# Patient Record
Sex: Male | Born: 1955 | Race: White | Hispanic: No | Marital: Married | Smoking: Former smoker
Health system: Southern US, Community
[De-identification: ages and names within clinical notes are randomized; demographics above are authoritative.]

## PROBLEM LIST (undated history)

## (undated) DIAGNOSIS — E291 Testicular hypofunction: Secondary | ICD-10-CM

## (undated) DIAGNOSIS — L409 Psoriasis, unspecified: Secondary | ICD-10-CM

## (undated) DIAGNOSIS — R0989 Other specified symptoms and signs involving the circulatory and respiratory systems: Secondary | ICD-10-CM

## (undated) DIAGNOSIS — T7840XA Allergy, unspecified, initial encounter: Secondary | ICD-10-CM

## (undated) DIAGNOSIS — K219 Gastro-esophageal reflux disease without esophagitis: Secondary | ICD-10-CM

## (undated) DIAGNOSIS — Z86010 Personal history of colonic polyps: Secondary | ICD-10-CM

## (undated) HISTORY — DX: Psoriasis, unspecified: L40.9

## (undated) HISTORY — DX: Other specified symptoms and signs involving the circulatory and respiratory systems: R09.89

## (undated) HISTORY — DX: Allergy, unspecified, initial encounter: T78.40XA

## (undated) HISTORY — PX: POLYPECTOMY: SHX149

## (undated) HISTORY — DX: Testicular hypofunction: E29.1

## (undated) HISTORY — DX: Gastro-esophageal reflux disease without esophagitis: K21.9

## (undated) HISTORY — DX: Personal history of colonic polyps: Z86.010

---
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
## 1998-03-06 ENCOUNTER — Ambulatory Visit (HOSPITAL_COMMUNITY): Admission: RE | Admit: 1998-03-06 | Discharge: 1998-03-06 | Payer: Self-pay | Admitting: Internal Medicine

## 1998-06-25 ENCOUNTER — Emergency Department (HOSPITAL_COMMUNITY): Admission: EM | Admit: 1998-06-25 | Discharge: 1998-06-25 | Payer: Self-pay | Admitting: Psychiatry

## 1999-03-05 ENCOUNTER — Ambulatory Visit (HOSPITAL_COMMUNITY): Admission: RE | Admit: 1999-03-05 | Discharge: 1999-03-05 | Payer: Self-pay | Admitting: Internal Medicine

## 1999-03-05 ENCOUNTER — Encounter: Payer: Self-pay | Admitting: Internal Medicine

## 2000-03-03 ENCOUNTER — Ambulatory Visit (HOSPITAL_COMMUNITY): Admission: RE | Admit: 2000-03-03 | Discharge: 2000-03-03 | Payer: Self-pay | Admitting: Internal Medicine

## 2000-03-03 ENCOUNTER — Encounter: Payer: Self-pay | Admitting: Internal Medicine

## 2001-03-08 ENCOUNTER — Ambulatory Visit (HOSPITAL_COMMUNITY): Admission: RE | Admit: 2001-03-08 | Discharge: 2001-03-08 | Payer: Self-pay | Admitting: Internal Medicine

## 2001-03-08 ENCOUNTER — Encounter: Payer: Self-pay | Admitting: Internal Medicine

## 2002-04-11 ENCOUNTER — Encounter: Payer: Self-pay | Admitting: Internal Medicine

## 2002-04-11 ENCOUNTER — Ambulatory Visit (HOSPITAL_COMMUNITY): Admission: RE | Admit: 2002-04-11 | Discharge: 2002-04-11 | Payer: Self-pay | Admitting: Internal Medicine

## 2003-07-01 ENCOUNTER — Encounter: Payer: Self-pay | Admitting: Internal Medicine

## 2003-07-01 ENCOUNTER — Ambulatory Visit (HOSPITAL_COMMUNITY): Admission: RE | Admit: 2003-07-01 | Discharge: 2003-07-01 | Payer: Self-pay | Admitting: Internal Medicine

## 2006-08-10 ENCOUNTER — Ambulatory Visit: Payer: Self-pay | Admitting: Internal Medicine

## 2006-08-25 ENCOUNTER — Ambulatory Visit: Payer: Self-pay | Admitting: Internal Medicine

## 2011-04-02 HISTORY — PX: HEMORRHOID BANDING: SHX5850

## 2014-01-30 ENCOUNTER — Other Ambulatory Visit: Payer: Self-pay | Admitting: Internal Medicine

## 2014-06-30 DIAGNOSIS — K219 Gastro-esophageal reflux disease without esophagitis: Secondary | ICD-10-CM | POA: Insufficient documentation

## 2014-06-30 DIAGNOSIS — E349 Endocrine disorder, unspecified: Secondary | ICD-10-CM | POA: Insufficient documentation

## 2014-06-30 DIAGNOSIS — L409 Psoriasis, unspecified: Secondary | ICD-10-CM | POA: Insufficient documentation

## 2014-06-30 DIAGNOSIS — R0989 Other specified symptoms and signs involving the circulatory and respiratory systems: Secondary | ICD-10-CM | POA: Insufficient documentation

## 2014-07-03 ENCOUNTER — Ambulatory Visit: Payer: Managed Care, Other (non HMO) | Admitting: Internal Medicine

## 2014-07-03 ENCOUNTER — Encounter: Payer: Self-pay | Admitting: Internal Medicine

## 2014-07-03 VITALS — BP 112/74 | HR 72 | Temp 98.6°F | Resp 16 | Ht 73.75 in | Wt 197.0 lb

## 2014-07-03 DIAGNOSIS — K4030 Unilateral inguinal hernia, with obstruction, without gangrene, not specified as recurrent: Secondary | ICD-10-CM

## 2014-07-03 NOTE — Patient Instructions (Signed)

## 2014-07-03 NOTE — Progress Notes (Signed)
   Subjective:    Patient ID: Christopher Lawrence, male    DOB: 13-Feb-1956, 58 y.o.   MRN: [id number]  HPI patient presents for possible Rt groin hernia. Has occas pulling sensation and pains in the Rt groin.  Medication List   EPINEPHrine 0.3 mg/0.3 mL Soaj injection  Commonly known as:  EPI-PEN  Inject 0.3 mg into the muscle once.     MULTIVITAMIN PO  Take by mouth daily.     ranitidine 300 MG tablet  Commonly known as:  ZANTAC  Take one tablet by mouth one time daily     VITAMIN D PO  Take 2,000 Units by mouth 2 (two) times daily.     Allergies  Allergen Reactions  . Wasp Venom Anaphylaxis    Wasp/Hornet/Bee  . Androgel [Testosterone] Other (See Comments)    headaches   Past Medical History  Diagnosis Date  . GERD (gastroesophageal reflux disease)   . Labile hypertension   . Other testicular hypofunction   . Psoriasis    Review of Systems Neg except as above  Objective:   Physical Exam BP 112/74  Pulse 72  Temp(Src) 98.6 F (37 C) (Temporal)  Resp 16  Ht 6' 1.75" (1.873 m)  Wt 197 lb (89.359 kg)  BMI 25.47 kg/m2  HEENT - Eac's patent. TM's Nl. EOM's full. PERRLA. NasoOroPharynx clear. Neck - supple. Nl Thyroid. Carotids 2+ & No bruits, nodes, JVD Chest - Clear equal BS w/o Rales, rhonchi, wheezes. Cor - Nl HS. RRR w/o sig MGR. PP 1(+). No edema. Abd - No palpable organomegaly, masses or tenderness. BS nl. Rt Inguinal ring is patulous with a small forced protrusion. Left side is neg. MS- FROM w/o deformities. Muscle power, tone and bulk Nl. Gait Nl. Neuro - No obvious  abnormalities. Psyche - Mental status normal & appropriate.   Assessment & Plan:   1. Rt. Inguinal hernia   - Ambulatory referral to General Surgery

## 2014-07-24 ENCOUNTER — Other Ambulatory Visit (INDEPENDENT_AMBULATORY_CARE_PROVIDER_SITE_OTHER): Payer: Self-pay | Admitting: Surgery

## 2014-07-26 DIAGNOSIS — Z131 Encounter for screening for diabetes mellitus: Secondary | ICD-10-CM | POA: Insufficient documentation

## 2014-07-26 DIAGNOSIS — E559 Vitamin D deficiency, unspecified: Secondary | ICD-10-CM | POA: Insufficient documentation

## 2014-07-26 DIAGNOSIS — E782 Mixed hyperlipidemia: Secondary | ICD-10-CM | POA: Insufficient documentation

## 2014-07-26 NOTE — Patient Instructions (Signed)
Recommend the book "The END of DIETING" by Dr Baker Janus   and the book "The END of DIABETES " by Dr Excell Seltzer  At [email or web address] - get book & Audio CD's      Being diabetic has a  300% increased risk for heart attack, stroke, cancer, and alzheimer- type vascular dementia. It is very important that you work harder with diet by avoiding all foods that are white except chicken & fish. Avoid white rice (brown & wild rice is OK), white potatoes (sweetpotatoes in moderation is OK), White bread or wheat bread or anything made out of white flour like bagels, donuts, rolls, buns, biscuits, cakes, pastries, cookies, pizza crust, and pasta (made from white flour & egg whites) - vegetarian pasta or spinach or wheat pasta is OK. Multigrain breads like Arnold's or [employer], or multigrain sandwich thins or flatbreads.  Diet, exercise and weight loss can reverse and cure diabetes in the early stages.  Diet, exercise and weight loss is very important in the control and prevention of complications of diabetes which affects every system in your body, ie. Brain - dementia/stroke, eyes - glaucoma/blindness, heart - heart attack/heart failure, kidneys - dialysis, stomach - gastric paralysis, intestines - malabsorption, nerves - severe painful neuritis, circulation - gangrene & loss of a leg(s), and finally cancer and Alzheimers.    I recommend avoid fried & greasy foods,  sweets/candy, white rice (brown or wild rice or Quinoa is OK), white potatoes (sweet potatoes are OK) - anything made from white flour - bagels, doughnuts, rolls, buns, biscuits,white and wheat breads, pizza crust and traditional pasta made of white flour & egg white(vegetarian pasta or spinach or wheat pasta is OK).  Multi-grain bread is OK - like multi-grain flat bread or sandwich thins. Avoid alcohol in excess. Exercise is also important.    Eat all the vegetables you want - avoid meat, especially red meat and dairy - especially cheese.  Cheese  is the most concentrated form of trans-fats which is the worst thing to clog up our arteries. Veggie cheese is OK which can be found in the fresh produce section at [employer] or [employer] or [employer]  Preventive Care for Adults A healthy lifestyle and preventive care can promote health and wellness. Preventive health guidelines for men include the following key practices:  A routine yearly physical is a good way to check with your health care provider about your health and preventative screening. It is a chance to share any concerns and updates on your health and to receive a thorough exam.  Visit your dentist for a routine exam and preventative care every 6 months. Brush your teeth twice a day and floss once a day. Good oral hygiene prevents tooth decay and gum disease.  The frequency of eye exams is based on your age, health, family medical history, use of contact lenses, and other factors. Follow your health care provider's recommendations for frequency of eye exams.  Eat a healthy diet. Foods such as vegetables, fruits, whole grains, low-fat dairy products, and lean protein foods contain the nutrients you need without too many calories. Decrease your intake of foods high in solid fats, added sugars, and salt. Eat the right amount of calories for you.Get information about a proper diet from your health care provider, if necessary.  Regular physical exercise is one of the most important things you can do for your health. Most adults should get at least 150 minutes of moderate-intensity exercise (any activity that  increases your heart rate and causes you to sweat) each week. In addition, most adults need muscle-strengthening exercises on 2 or more days a week.  Maintain a healthy weight. The body mass index (BMI) is a screening tool to identify possible weight problems. It provides an estimate of body fat based on height and weight. Your health care provider can find your BMI and can help you  achieve or maintain a healthy weight.For adults 20 years and older:  A BMI below 18.5 is considered underweight.  A BMI of 18.5 to 24.9 is normal.  A BMI of 25 to 29.9 is considered overweight.  A BMI of 30 and above is considered obese.  Maintain normal blood lipids and cholesterol levels by exercising and minimizing your intake of saturated fat. Eat a balanced diet with plenty of fruit and vegetables. Blood tests for lipids and cholesterol should begin at age 20 and be repeated every 5 years. If your lipid or cholesterol levels are high, you are over 50, or you are at high risk for heart disease, you may need your cholesterol levels checked more frequently.Ongoing high lipid and cholesterol levels should be treated with medicines if diet and exercise are not working.  If you smoke, find out from your health care provider how to quit. If you do not use tobacco, do not start.  Lung cancer screening is recommended for adults aged 72-80 years who are at high risk for developing lung cancer because of a history of smoking. A yearly low-dose CT scan of the lungs is recommended for people who have at least a 30-pack-year history of smoking and are a current smoker or have quit within the past 15 years. A pack year of smoking is smoking an average of 1 pack of cigarettes a day for 1 year (for example: 1 pack a day for 30 years or 2 packs a day for 15 years). Yearly screening should continue until the smoker has stopped smoking for at least 15 years. Yearly screening should be stopped for people who develop a health problem that would prevent them from having lung cancer treatment.  If you choose to drink alcohol, do not have more than 2 drinks per day. One drink is considered to be 12 ounces (355 mL) of beer, 5 ounces (148 mL) of wine, or 1.5 ounces (44 mL) of liquor.  Avoid use of street drugs. Do not share needles with anyone. Ask for help if you need support or instructions about stopping the use of  drugs.  High blood pressure causes heart disease and increases the risk of stroke. Your blood pressure should be checked at least every 1-2 years. Ongoing high blood pressure should be treated with medicines, if weight loss and exercise are not effective.  If you are 28-64 years old, ask your health care provider if you should take aspirin to prevent heart disease.  Diabetes screening involves taking a blood sample to check your fasting blood sugar level. This should be done once every 3 years, after age 13, if you are within normal weight and without risk factors for diabetes. Testing should be considered at a younger age or be carried out more frequently if you are overweight and have at least 1 risk factor for diabetes.  Colorectal cancer can be detected and often prevented. Most routine colorectal cancer screening begins at the age of 78 and continues through age 56. However, your health care provider may recommend screening at an earlier age if you have risk  factors for colon cancer. On a yearly basis, your health care provider may provide home test kits to check for hidden blood in the stool. Use of a small camera at the end of a tube to directly examine the colon (sigmoidoscopy or colonoscopy) can detect the earliest forms of colorectal cancer. Talk to your health care provider about this at age 48, when routine screening begins. Direct exam of the colon should be repeated every 5-10 years through age 60, unless early forms of precancerous polyps or small growths are found.  People who are at an increased risk for hepatitis B should be screened for this virus. You are considered at high risk for hepatitis B if:  You were born in a country where hepatitis B occurs often. Talk with your health care provider about which countries are considered high risk.  Your parents were born in a high-risk country and you have not received a shot to protect against hepatitis B (hepatitis B vaccine).  You have  HIV or AIDS.  You use needles to inject street drugs.  You live with, or have sex with, someone who has hepatitis B.  You are a man who has sex with other men (MSM).  You get hemodialysis treatment.  You take certain medicines for conditions such as cancer, organ transplantation, and autoimmune conditions.  Hepatitis C blood testing is recommended for all people born from [birth year] through 1965 and any individual with known risks for hepatitis C.  Practice safe sex. Use condoms and avoid high-risk sexual practices to reduce the spread of sexually transmitted infections (STIs). STIs include gonorrhea, chlamydia, syphilis, trichomonas, herpes, HPV, and human immunodeficiency virus (HIV). Herpes, HIV, and HPV are viral illnesses that have no cure. They can result in disability, cancer, and death.  If you are at risk of being infected with HIV, it is recommended that you take a prescription medicine daily to prevent HIV infection. This is called preexposure prophylaxis (PrEP). You are considered at risk if:  You are a man who has sex with other men (MSM) and have other risk factors.  You are a heterosexual man, are sexually active, and are at increased risk for HIV infection.  You take drugs by injection.  You are sexually active with a partner who has HIV.  Talk with your health care provider about whether you are at high risk of being infected with HIV. If you choose to begin PrEP, you should first be tested for HIV. You should then be tested every 3 months for as long as you are taking PrEP.  A one-time screening for abdominal aortic aneurysm (AAA) and surgical repair of large AAAs by ultrasound are recommended for men ages 51 to 11 years who are current or former smokers.  Healthy men should no longer receive prostate-specific antigen (PSA) blood tests as part of routine cancer screening. Talk with your health care provider about prostate cancer screening.  Testicular cancer screening is  not recommended for adult males who have no symptoms. Screening includes self-exam, a health care provider exam, and other screening tests. Consult with your health care provider about any symptoms you have or any concerns you have about testicular cancer.  Use sunscreen. Apply sunscreen liberally and repeatedly throughout the day. You should seek shade when your shadow is shorter than you. Protect yourself by wearing long sleeves, pants, a wide-brimmed hat, and sunglasses year round, whenever you are outdoors.  Once a month, do a whole-body skin exam, using a mirror to look  at the skin on your back. Tell your health care provider about new moles, moles that have irregular borders, moles that are larger than a pencil eraser, or moles that have changed in shape or color.  Stay current with required vaccines (immunizations).  Influenza vaccine. All adults should be immunized every year.  Tetanus, diphtheria, and acellular pertussis (Td, Tdap) vaccine. An adult who has not previously received Tdap or who does not know his vaccine status should receive 1 dose of Tdap. This initial dose should be followed by tetanus and diphtheria toxoids (Td) booster doses every 10 years. Adults with an unknown or incomplete history of completing a 3-dose immunization series with Td-containing vaccines should begin or complete a primary immunization series including a Tdap dose. Adults should receive a Td booster every 10 years.  Varicella vaccine. An adult without evidence of immunity to varicella should receive 2 doses or a second dose if he has previously received 1 dose.  Human papillomavirus (HPV) vaccine. Males aged 29-21 years who have not received the vaccine previously should receive the 3-dose series. Males aged 22-26 years may be immunized. Immunization is recommended through the age of 26 years for any male who has sex with males and did not get any or all doses earlier. Immunization is recommended for any  person with an immunocompromised condition through the age of 29 years if he did not get any or all doses earlier. During the 3-dose series, the second dose should be obtained 4-8 weeks after the first dose. The third dose should be obtained 24 weeks after the first dose and 16 weeks after the second dose.  Zoster vaccine. One dose is recommended for adults aged 38 years or older unless certain conditions are present.  Measles, mumps, and rubella (MMR) vaccine. Adults born before [birth year] generally are considered immune to measles and mumps. Adults born in [birth year] or later should have 1 or more doses of MMR vaccine unless there is a contraindication to the vaccine or there is laboratory evidence of immunity to each of the three diseases. A routine second dose of MMR vaccine should be obtained at least 28 days after the first dose for students attending postsecondary schools, health care workers, or international travelers. People who received inactivated measles vaccine or an unknown type of measles vaccine during 1963-1967 should receive 2 doses of MMR vaccine. People who received inactivated mumps vaccine or an unknown type of mumps vaccine before 1979 and are at high risk for mumps infection should consider immunization with 2 doses of MMR vaccine. Unvaccinated health care workers born before [birth year] who lack laboratory evidence of measles, mumps, or rubella immunity or laboratory confirmation of disease should consider measles and mumps immunization with 2 doses of MMR vaccine or rubella immunization with 1 dose of MMR vaccine.  Pneumococcal 13-valent conjugate (PCV13) vaccine. When indicated, a person who is uncertain of his immunization history and has no record of immunization should receive the PCV13 vaccine. An adult aged 68 years or older who has certain medical conditions and has not been previously immunized should receive 1 dose of PCV13 vaccine. This PCV13 should be followed with a dose of pneumococcal  polysaccharide (PPSV23) vaccine. The PPSV23 vaccine dose should be obtained at least 8 weeks after the dose of PCV13 vaccine. An adult aged 95 years or older who has certain medical conditions and previously received 1 or more doses of PPSV23 vaccine should receive 1 dose of PCV13. The PCV13 vaccine dose should be obtained 1  or more years after the last PPSV23 vaccine dose.  Pneumococcal polysaccharide (PPSV23) vaccine. When PCV13 is also indicated, PCV13 should be obtained first. All adults aged 65 years and older should be immunized. An adult younger than age 65 years who has certain medical conditions should be immunized. Any person who resides in a nursing home or long-term care facility should be immunized. An adult smoker should be immunized. People with an immunocompromised condition and certain other conditions should receive both PCV13 and PPSV23 vaccines. People with human immunodeficiency virus (HIV) infection should be immunized as soon as possible after diagnosis. Immunization during chemotherapy or radiation therapy should be avoided. Routine use of PPSV23 vaccine is not recommended for American Indians, [place] Natives, or people younger than 65 years unless there are medical conditions that require PPSV23 vaccine. When indicated, people who have unknown immunization and have no record of immunization should receive PPSV23 vaccine. One-time revaccination 5 years after the first dose of PPSV23 is recommended for people aged 19-64 years who have chronic kidney failure, nephrotic syndrome, asplenia, or immunocompromised conditions. People who received 1-2 doses of PPSV23 before age 65 years should receive another dose of PPSV23 vaccine at age 65 years or later if at least 5 years have passed since the previous dose. Doses of PPSV23 are not needed for people immunized with PPSV23 at or after age 65 years.  Meningococcal vaccine. Adults with asplenia or persistent complement component deficiencies  should receive 2 doses of quadrivalent meningococcal conjugate (MenACWY-D) vaccine. The doses should be obtained at least 2 months apart. Microbiologists working with certain meningococcal bacteria, military recruits, people at risk during an outbreak, and people who travel to or live in countries with a high rate of meningitis should be immunized. A first-year college student up through age 21 years who is living in a residence hall should receive a dose if he did not receive a dose on or after his 16th birthday. Adults who have certain high-risk conditions should receive one or more doses of vaccine.  Hepatitis A vaccine. Adults who wish to be protected from this disease, have certain high-risk conditions, work with hepatitis A-infected animals, work in hepatitis A research labs, or travel to or work in countries with a high rate of hepatitis A should be immunized. Adults who were previously unvaccinated and who anticipate close contact with an international adoptee during the first 60 days after arrival in the [place] from a country with a high rate of hepatitis A should be immunized.  Hepatitis B vaccine. Adults should be immunized if they wish to be protected from this disease, have certain high-risk conditions, may be exposed to blood or other infectious body fluids, are household contacts or sex partners of hepatitis B positive people, are clients or workers in certain care facilities, or travel to or work in countries with a high rate of hepatitis B.  Haemophilus influenzae type b (Hib) vaccine. A previously unvaccinated person with asplenia or sickle cell disease or having a scheduled splenectomy should receive 1 dose of Hib vaccine. Regardless of previous immunization, a recipient of a hematopoietic stem cell transplant should receive a 3-dose series 6-12 months after his successful transplant. Hib vaccine is not recommended for adults with HIV infection. Preventive Service / Frequency Ages  40 to 64  Blood pressure check.** / Every 1 to 2 years.  Lipid and cholesterol check.** / Every 5 years beginning at age 20.  Lung cancer screening. / Every year if you are aged 55-80   years and have a 30-pack-year history of smoking and currently smoke or have quit within the past 15 years. Yearly screening is stopped once you have quit smoking for at least 15 years or develop a health problem that would prevent you from having lung cancer treatment.  Fecal occult blood test (FOBT) of stool. / Every year beginning at age 50 and continuing until age 75. You may not have to do this test if you get a colonoscopy every 10 years.  Flexible sigmoidoscopy** or colonoscopy.** / Every 5 years for a flexible sigmoidoscopy or every 10 years for a colonoscopy beginning at age 50 and continuing until age 75.  Hepatitis C blood test.** / For all people born from 1945 through 1965 and any individual with known risks for hepatitis C.  Skin self-exam. / Monthly.  Influenza vaccine. / Every year.  Tetanus, diphtheria, and acellular pertussis (Tdap/Td) vaccine.** / Consult your health care provider. 1 dose of Td every 10 years.  Varicella vaccine.** / Consult your health care provider.  Zoster vaccine.** / 1 dose for adults aged 60 years or older.  Measles, mumps, rubella (MMR) vaccine.** / You need at least 1 dose of MMR if you were born in 1957 or later. You may also need a second dose.  Pneumococcal 13-valent conjugate (PCV13) vaccine.** / Consult your health care provider.  Pneumococcal polysaccharide (PPSV23) vaccine.** / 1 to 2 doses if you smoke cigarettes or if you have certain conditions.  Meningococcal vaccine.** / Consult your health care provider.  Hepatitis A vaccine.** / Consult your health care provider.  Hepatitis B vaccine.** / Consult your health care provider.  Haemophilus influenzae type b (Hib) vaccine.** / Consult your health care provider.  

## 2014-07-26 NOTE — Progress Notes (Signed)
Patient ID: Christopher Lawrence, male   DOB: 09-Feb-1956, 58 y.o.   MRN: [id number]  Annual Screening Comprehensive Examination  This very nice 58 y.o.SWM presents for complete physical.  Patient has been followed for BP monitoring,   Prediabetes Screening , Hypertriglyceridemia, Testosterone and Vitamin D Deficiency.   Patient has remote Hx/o elevated BP in 2003 and has been monitored expectantly since. . Patient's BP has been controlled at home.Today's BP: 126/84 mmHg. Patient denies any cardiac symptoms as chest pain, palpitations, shortness of breath, dizziness or ankle swelling.   Patient's hyperlipidemia is controlled with diet. Last lipids TC 156,HDL 53 and LDL 59 in Sept 2104  at goal and elevated TG 220.   Patient has been screened for prediabetes and patient denies reactive hypoglycemic symptoms, visual blurring, diabetic polys or paresthesias. Last A1c was 5.4% with Nl insulin level in Sept 2014.     Patient has Testosterone Deficiency (180 in 2010 & 166 in Sept 2014) and feels Asx and defers treatment at present. Finally, patient has history of Vitamin D Deficiency of 44 in 2008 and last vitamin D was 70 in Sept 2014.  Medication Sig  . VITAMIN D  Take 2,000 Units 2  times daily.   Marland Kitchen EPINEPHrine 0.3 mg/0.3 mL (Epipen) injection Inject 0.3 mg into the muscle once.  . Multiple Vitamins-Minerals  Take by mouth daily.  . ranitidine (ZANTAC) 300 MG tablet Take one tablet by mouth one time daily   Allergies  Allergen Reactions  . Wasp Venom Anaphylaxis    Wasp/Hornet/Bee  . Androgel [Testosterone] Other (See Comments)    headaches   Past Medical History  Diagnosis Date  . GERD (gastroesophageal reflux disease)   . Labile hypertension   . Other testicular hypofunction   . Psoriasis    Past Surgical History  Procedure Laterality Date  . Hemorrhoid banding  04/2011  . Hernia repair Right 1958   Family History  Problem Relation Age of Onset  . Hypertension Mother   . Colon polyps  Mother   . Cancer Mother     Uterine  . COPD Father   . Arthritis Father   . Cancer Father     Prostate   History   Social History  . Marital Status: Single    Spouse Name: N/A    Number of Children: N/A  . Years of Education: N/A   Occupational History  . Waiter/Bartender   Social History Main Topics  . Smoking status: Former Research scientist (life sciences)  . Smokeless tobacco: Never Used  . Alcohol Use: Yes     Comment: occasional wine  . Drug Use: No  . Sexual Activity: Not on file    ROS Constitutional: Denies fever, chills, weight loss/gain, headaches, insomnia, fatigue, night sweats or change in appetite. Eyes: Denies redness, blurred vision, diplopia, discharge, itchy or watery eyes.  ENT: Denies discharge, congestion, post nasal drip, epistaxis, sore throat, earache, hearing loss, dental pain, Tinnitus, Vertigo, Sinus pain or snoring.  Cardio: Denies chest pain, palpitations, irregular heartbeat, syncope, dyspnea, diaphoresis, orthopnea, PND, claudication or edema Respiratory: denies cough, dyspnea, DOE, pleurisy, hoarseness, laryngitis or wheezing.  Gastrointestinal: Denies dysphagia, heartburn, reflux, water brash, pain, cramps, nausea, vomiting, bloating, diarrhea, constipation, hematemesis, melena, hematochezia, jaundice or hemorrhoids Genitourinary: Denies dysuria, frequency, urgency, nocturia, hesitancy, discharge, hematuria or flank pain Musculoskeletal: Denies arthralgia, myalgia, stiffness, Jt. Swelling, pain, limp or strain/sprain. Denies Falls. Skin: Denies puritis, rash, hives, warts, acne, eczema or change in skin lesion Neuro: No weakness, tremor, incoordination, spasms,  paresthesia or pain Psychiatric: Denies confusion, memory loss or sensory loss. Denies Depression. Endocrine: Denies change in weight, skin, hair change, nocturia, and paresthesia, diabetic polys, visual blurring or hyper / hypo glycemic episodes.  Heme/Lymph: No excessive bleeding, bruising or enlarged lymph  nodes.  Physical Exam  BP 126/84  Pulse 68  Temp(Src) 98.2 F (36.8 C) (Temporal)  Resp 16  Ht 6\' 2"  (1.88 m)  Wt 202 lb (91.627 kg)  BMI 25.92 kg/m2  General Appearance: Well nourished, in no apparent distress. Eyes: PERRLA, EOMs, conjunctiva no swelling or erythema, normal fundi and vessels. Sinuses: No frontal/maxillary tenderness ENT/Mouth: EACs patent / TMs  nl. Nares clear without erythema, swelling, mucoid exudates. Oral hygiene is good. No erythema, swelling, or exudate. Tongue normal, non-obstructing. Tonsils not swollen or erythematous. Hearing normal.  Neck: Supple, thyroid normal. No bruits, nodes or JVD. Respiratory: Respiratory effort normal.  BS equal and clear bilateral without rales, rhonci, wheezing or stridor. Cardio: Heart sounds are normal with regular rate and rhythm and no murmurs, rubs or gallops. Peripheral pulses are normal and equal bilaterally without edema. No aortic or femoral bruits. Chest: symmetric with normal excursions and percussion.  Abdomen: Flat, soft, with bowl sounds. Nontender, no guarding, rebound, hernias, masses, or organomegaly.  Lymphatics: Non tender without lymphadenopathy.  Genitourinary: No hernias.Testes nl. DRE - prostate nl for age - smooth & firm w/o nodules. Musculoskeletal: Full ROM all peripheral extremities, joint stability, 5/5 strength, and normal gait. Skin: Warm and dry without rashes, lesions, cyanosis, clubbing or  ecchymosis.  Neuro: Cranial nerves intact, reflexes equal bilaterally. Normal muscle tone, no cerebellar symptoms. Sensation intact.  Pysch: Awake and oriented X 3with normal affect, insight and judgment appropriate.  Assessment and Plan  1. Annual Screening Examination 2. Hypertension, Labile  3. Hyperlipidemia, Diet 4. Pre Diabetes, Screening 5. Vitamin D Deficiency 6. Testosterone Deficiency   Continue prudent diet as discussed, weight control, BP monitoring, regular exercise, and medications as  discussed.  Discussed med effects and SE's. Routine screening labs and tests as requested with regular follow-up as recommended.

## 2014-07-29 ENCOUNTER — Ambulatory Visit (INDEPENDENT_AMBULATORY_CARE_PROVIDER_SITE_OTHER): Payer: Managed Care, Other (non HMO) | Admitting: Internal Medicine

## 2014-07-29 ENCOUNTER — Encounter: Payer: Self-pay | Admitting: Internal Medicine

## 2014-07-29 VITALS — BP 126/84 | HR 68 | Temp 98.2°F | Resp 16 | Ht 74.0 in | Wt 202.0 lb

## 2014-07-29 DIAGNOSIS — E291 Testicular hypofunction: Secondary | ICD-10-CM

## 2014-07-29 DIAGNOSIS — I10 Essential (primary) hypertension: Secondary | ICD-10-CM

## 2014-07-29 DIAGNOSIS — Z111 Encounter for screening for respiratory tuberculosis: Secondary | ICD-10-CM

## 2014-07-29 DIAGNOSIS — R740 Nonspecific elevation of levels of transaminase and lactic acid dehydrogenase [LDH]: Secondary | ICD-10-CM

## 2014-07-29 DIAGNOSIS — Z23 Encounter for immunization: Secondary | ICD-10-CM

## 2014-07-29 DIAGNOSIS — E782 Mixed hyperlipidemia: Secondary | ICD-10-CM

## 2014-07-29 DIAGNOSIS — Z113 Encounter for screening for infections with a predominantly sexual mode of transmission: Secondary | ICD-10-CM

## 2014-07-29 DIAGNOSIS — R7401 Elevation of levels of liver transaminase levels: Secondary | ICD-10-CM

## 2014-07-29 DIAGNOSIS — Z125 Encounter for screening for malignant neoplasm of prostate: Secondary | ICD-10-CM

## 2014-07-29 DIAGNOSIS — Z0000 Encounter for general adult medical examination without abnormal findings: Secondary | ICD-10-CM

## 2014-07-29 DIAGNOSIS — R7402 Elevation of levels of lactic acid dehydrogenase (LDH): Secondary | ICD-10-CM

## 2014-07-29 DIAGNOSIS — E559 Vitamin D deficiency, unspecified: Secondary | ICD-10-CM

## 2014-07-29 DIAGNOSIS — Z1212 Encounter for screening for malignant neoplasm of rectum: Secondary | ICD-10-CM

## 2014-07-29 DIAGNOSIS — R0989 Other specified symptoms and signs involving the circulatory and respiratory systems: Secondary | ICD-10-CM

## 2014-07-30 LAB — CBC WITH DIFFERENTIAL/PLATELET
Basophils Absolute: 0.1 10*3/uL (ref 0.0–0.1)
Basophils Relative: 1 % (ref 0–1)
EOS ABS: 0.3 10*3/uL (ref 0.0–0.7)
Eosinophils Relative: 5 % (ref 0–5)
HCT: 44 % (ref 39.0–52.0)
Hemoglobin: 15.2 g/dL (ref 13.0–17.0)
LYMPHS PCT: 36 % (ref 12–46)
Lymphs Abs: 1.8 10*3/uL (ref 0.7–4.0)
MCH: 30.7 pg (ref 26.0–34.0)
MCHC: 34.5 g/dL (ref 30.0–36.0)
MCV: 88.9 fL (ref 78.0–100.0)
Monocytes Absolute: 0.4 10*3/uL (ref 0.1–1.0)
Monocytes Relative: 8 % (ref 3–12)
NEUTROS PCT: 50 % (ref 43–77)
Neutro Abs: 2.6 10*3/uL (ref 1.7–7.7)
PLATELETS: 198 10*3/uL (ref 150–400)
RBC: 4.95 MIL/uL (ref 4.22–5.81)
RDW: 14 % (ref 11.5–15.5)
WBC: 5.1 10*3/uL (ref 4.0–10.5)

## 2014-07-30 LAB — BASIC METABOLIC PANEL WITH GFR
BUN: 16 mg/dL (ref 6–23)
CALCIUM: 9.1 mg/dL (ref 8.4–10.5)
CO2: 24 meq/L (ref 19–32)
Chloride: 104 mEq/L (ref 96–112)
Creat: 0.77 mg/dL (ref 0.50–1.35)
GFR, Est African American: >89 mL/min
GFR, Est Non African American: >89 mL/min
Glucose, Bld: 99 mg/dL (ref 70–99)
Potassium: 4.3 mEq/L (ref 3.5–5.3)
SODIUM: 136 meq/L (ref 135–145)

## 2014-07-30 LAB — URINALYSIS, MICROSCOPIC ONLY
BACTERIA UA: NONE SEEN
CASTS: NONE SEEN
CRYSTALS: NONE SEEN
SQUAMOUS EPITHELIAL / LPF: NONE SEEN

## 2014-07-30 LAB — HEPATIC FUNCTION PANEL
ALT: 37 U/L (ref 0–53)
AST: 24 U/L (ref 0–37)
Albumin: 4.2 g/dL (ref 3.5–5.2)
Alkaline Phosphatase: 82 U/L (ref 39–117)
Bilirubin, Direct: 0.1 mg/dL (ref 0.0–0.3)
Indirect Bilirubin: 0.4 mg/dL (ref 0.2–1.2)
Total Bilirubin: 0.5 mg/dL (ref 0.2–1.2)
Total Protein: 6.4 g/dL (ref 6.0–8.3)

## 2014-07-30 LAB — MICROALBUMIN / CREATININE URINE RATIO
Creatinine, Urine: 66.8 mg/dL
Microalb Creat Ratio: 3 mg/g (ref 0.0–30.0)
Microalb, Ur: 0.2 mg/dL (ref <2.0)

## 2014-07-30 LAB — LIPID PANEL
CHOLESTEROL: 155 mg/dL (ref 0–200)
HDL: 66 mg/dL (ref >39)
LDL Cholesterol: 72 mg/dL (ref 0–99)
Total CHOL/HDL Ratio: 2.3 Ratio
Triglycerides: 87 mg/dL (ref <150)
VLDL: 17 mg/dL (ref 0–40)

## 2014-07-30 LAB — HEMOGLOBIN A1C
Hgb A1c MFr Bld: 5.4 % (ref <5.7)
Mean Plasma Glucose: 108 mg/dL (ref <117)

## 2014-07-30 LAB — TSH: TSH: 1.953 u[IU]/mL (ref 0.350–4.500)

## 2014-07-30 LAB — MAGNESIUM: Magnesium: 1.7 mg/dL (ref 1.5–2.5)

## 2014-07-30 LAB — VITAMIN B12: Vitamin B-12: 610 pg/mL (ref 211–911)

## 2014-07-30 LAB — TESTOSTERONE: TESTOSTERONE: 247 ng/dL — AB (ref 300–890)

## 2014-07-30 LAB — INSULIN, FASTING: Insulin fasting, serum: 21.6 u[IU]/mL — ABNORMAL HIGH (ref 2.0–19.6)

## 2014-07-30 LAB — VITAMIN D 25 HYDROXY (VIT D DEFICIENCY, FRACTURES): VIT D 25 HYDROXY: 90 ng/mL — AB (ref 30–89)

## 2014-07-30 LAB — PSA: PSA: 2.25 ng/mL (ref <=4.00)

## 2014-07-31 LAB — TB SKIN TEST
INDURATION: 0 mm
TB SKIN TEST: NEGATIVE

## 2014-08-05 ENCOUNTER — Other Ambulatory Visit (INDEPENDENT_AMBULATORY_CARE_PROVIDER_SITE_OTHER): Payer: Managed Care, Other (non HMO) | Admitting: *Deleted

## 2014-08-05 DIAGNOSIS — Z1212 Encounter for screening for malignant neoplasm of rectum: Secondary | ICD-10-CM

## 2014-08-05 LAB — POC HEMOCCULT BLD/STL (HOME/3-CARD/SCREEN)
Card #2 Fecal Occult Blod, POC: NEGATIVE
Card #3 Fecal Occult Blood, POC: NEGATIVE
FECAL OCCULT BLD: NEGATIVE

## 2014-08-16 ENCOUNTER — Encounter (HOSPITAL_COMMUNITY): Payer: Self-pay

## 2014-08-21 ENCOUNTER — Encounter (HOSPITAL_COMMUNITY): Payer: Self-pay

## 2014-08-21 ENCOUNTER — Ambulatory Visit (HOSPITAL_COMMUNITY)
Admission: RE | Admit: 2014-08-21 | Discharge: 2014-08-21 | Disposition: A | Discharge status: Home / Self Care | Payer: Managed Care, Other (non HMO) | Source: Ambulatory Visit | Attending: Anesthesiology | Admitting: Anesthesiology

## 2014-08-21 ENCOUNTER — Encounter (HOSPITAL_COMMUNITY)
Admission: RE | Admit: 2014-08-21 | Discharge: 2014-08-21 | Disposition: A | Discharge status: Home / Self Care | Payer: Managed Care, Other (non HMO) | Source: Ambulatory Visit | Attending: Surgery | Admitting: Surgery

## 2014-08-21 DIAGNOSIS — I10 Essential (primary) hypertension: Secondary | ICD-10-CM

## 2014-08-21 DIAGNOSIS — Z01818 Encounter for other preprocedural examination: Secondary | ICD-10-CM | POA: Diagnosis not present

## 2014-08-21 DIAGNOSIS — K469 Unspecified abdominal hernia without obstruction or gangrene: Secondary | ICD-10-CM | POA: Insufficient documentation

## 2014-08-21 LAB — CBC
HCT: 43.6 % (ref 39.0–52.0)
HEMOGLOBIN: 15.4 g/dL (ref 13.0–17.0)
MCH: 31 pg (ref 26.0–34.0)
MCHC: 35.3 g/dL (ref 30.0–36.0)
MCV: 87.7 fL (ref 78.0–100.0)
PLATELETS: 188 10*3/uL (ref 150–400)
RBC: 4.97 MIL/uL (ref 4.22–5.81)
RDW: 13.6 % (ref 11.5–15.5)
WBC: 7.5 10*3/uL (ref 4.0–10.5)

## 2014-08-21 LAB — BASIC METABOLIC PANEL
Anion gap: 14 (ref 5–15)
BUN: 24 mg/dL — AB (ref 6–23)
CHLORIDE: 104 meq/L (ref 96–112)
CO2: 23 meq/L (ref 19–32)
Calcium: 9.4 mg/dL (ref 8.4–10.5)
Creatinine, Ser: 0.75 mg/dL (ref 0.50–1.35)
GFR calc Af Amer: >90 mL/min (ref >90)
GFR calc non Af Amer: >90 mL/min (ref >90)
GLUCOSE: 107 mg/dL — AB (ref 70–99)
POTASSIUM: 4.5 meq/L (ref 3.7–5.3)
SODIUM: 141 meq/L (ref 137–147)

## 2014-08-21 NOTE — Progress Notes (Addendum)
PCP is Dr Unk Pinto Denies seeing a cardiologist. Denies ever having a stress test, Echo, or card cath. Denies having a recent CXR. EKG noted in epic from 07-29-14.

## 2014-08-21 NOTE — Pre-Procedure Instructions (Signed)
Christopher Lawrence  08/21/2014  Your procedure is scheduled on:  Oct 30  Report to [facility] Admitting at 0530 AM.  Call this number if you have problems the morning of surgery: [phone]   Remember:   Do not eat food or drink liquids after midnight.   Take these medicines the morning of surgery with A SIP OF WATER: Ranitidine (Zantac)  Stop taking Aspirin, Ibuprofen BC's, Goody's, Herbal medications, and Fish Oil   Do not wear jewelry, make-up or nail polish.  Do not wear lotions, powders, or perfumes. You may wear deodorant.  Do not shave 48 hours prior to surgery. Men may shave face and neck.  Do not bring valuables to the hospital.  [facility] is not responsible                  for any belongings or valuables.               Contacts, dentures or bridgework may not be worn into surgery.  Leave suitcase in the car. After surgery it may be brought to your room.  For patients admitted to the hospital, discharge time is determined by your                treatment team.               Patients discharged the day of surgery will not be allowed to drive  home.    Special Instructions: [place] - Preparing for Surgery  Before surgery, you can play an important role.  Because skin is not sterile, your skin needs to be as free of germs as possible.  You can reduce the number of germs on you skin by washing with CHG (chlorahexidine gluconate) soap before surgery.  CHG is an antiseptic cleaner which kills germs and bonds with the skin to continue killing germs even after washing.  Please DO NOT use if you have an allergy to CHG or antibacterial soaps.  If your skin becomes reddened/irritated stop using the CHG and inform your nurse when you arrive at Short Stay.  Do not shave (including legs and underarms) for at least 48 hours prior to the first CHG shower.  You may shave your face.  Please follow these instructions carefully:   1.  Shower with CHG Soap the night before  surgery and the                                morning of Surgery.  2.  If you choose to wash your hair, wash your hair first as usual with your       normal shampoo.  3.  After you shampoo, rinse your hair and body thoroughly to remove the                      Shampoo.  4.  Use CHG as you would any other liquid soap.  You can apply chg directly       to the skin and wash gently with scrungie or a clean washcloth.  5.  Apply the CHG Soap to your body ONLY FROM THE NECK DOWN.        Do not use on open wounds or open sores.  Avoid contact with your eyes,       ears, mouth and genitals (private parts).  Wash genitals (private parts)  with your normal soap.  6.  Wash thoroughly, paying special attention to the area where your surgery        will be performed.  7.  Thoroughly rinse your body with warm water from the neck down.  8.  DO NOT shower/wash with your normal soap after using and rinsing off       the CHG Soap.  9.  Pat yourself dry with a clean towel.            10.  Wear clean pajamas.            11.  Place clean sheets on your bed the night of your first shower and do not        sleep with pets.  Day of Surgery  Do not apply any lotions/deoderants the morning of surgery.  Please wear clean clothes to the hospital/surgery center.      Please read over the following fact sheets that you were given: Pain Booklet, Coughing and Deep Breathing and Surgical Site Infection Prevention

## 2014-08-29 MED ORDER — CHLORHEXIDINE GLUCONATE 4 % EX LIQD
1.0000 | Freq: Once | CUTANEOUS | Status: DC
Start: 2014-08-30 — End: 2014-08-30
  Filled 2014-08-29: qty 15

## 2014-08-29 MED ORDER — CHLORHEXIDINE GLUCONATE 4 % EX LIQD
1.0000 "application " | Freq: Once | CUTANEOUS | Status: DC
  Filled 2014-08-29: qty 15

## 2014-08-29 MED ORDER — CEFAZOLIN SODIUM-DEXTROSE 2-3 GM-% IV SOLR
2.0000 g | INTRAVENOUS | Status: AC
  Administered 2014-08-30: 2 g via INTRAVENOUS
  Filled 2014-08-29: qty 50

## 2014-08-30 ENCOUNTER — Encounter (HOSPITAL_COMMUNITY)
Admission: RE | Disposition: A | Discharge status: Home / Self Care | Payer: Self-pay | Source: Ambulatory Visit | Attending: Surgery

## 2014-08-30 ENCOUNTER — Encounter (HOSPITAL_COMMUNITY): Payer: Self-pay | Admitting: Certified Registered Nurse Anesthetist

## 2014-08-30 ENCOUNTER — Ambulatory Visit (HOSPITAL_COMMUNITY): Payer: Managed Care, Other (non HMO) | Admitting: Certified Registered Nurse Anesthetist

## 2014-08-30 ENCOUNTER — Encounter (HOSPITAL_COMMUNITY): Payer: Managed Care, Other (non HMO) | Admitting: Certified Registered Nurse Anesthetist

## 2014-08-30 ENCOUNTER — Ambulatory Visit (HOSPITAL_COMMUNITY)
Admission: RE | Admit: 2014-08-30 | Discharge: 2014-08-30 | Disposition: A | Discharge status: Home / Self Care | Payer: Managed Care, Other (non HMO) | Source: Ambulatory Visit | Attending: Surgery | Admitting: Surgery

## 2014-08-30 DIAGNOSIS — I10 Essential (primary) hypertension: Secondary | ICD-10-CM | POA: Diagnosis not present

## 2014-08-30 DIAGNOSIS — E291 Testicular hypofunction: Secondary | ICD-10-CM | POA: Diagnosis not present

## 2014-08-30 DIAGNOSIS — E782 Mixed hyperlipidemia: Secondary | ICD-10-CM | POA: Diagnosis not present

## 2014-08-30 DIAGNOSIS — Z87891 Personal history of nicotine dependence: Secondary | ICD-10-CM | POA: Diagnosis not present

## 2014-08-30 DIAGNOSIS — K219 Gastro-esophageal reflux disease without esophagitis: Secondary | ICD-10-CM | POA: Diagnosis not present

## 2014-08-30 DIAGNOSIS — E559 Vitamin D deficiency, unspecified: Secondary | ICD-10-CM | POA: Insufficient documentation

## 2014-08-30 DIAGNOSIS — K4090 Unilateral inguinal hernia, without obstruction or gangrene, not specified as recurrent: Secondary | ICD-10-CM | POA: Diagnosis not present

## 2014-08-30 DIAGNOSIS — K429 Umbilical hernia without obstruction or gangrene: Secondary | ICD-10-CM | POA: Insufficient documentation

## 2014-08-30 DIAGNOSIS — L409 Psoriasis, unspecified: Secondary | ICD-10-CM | POA: Diagnosis not present

## 2014-08-30 DIAGNOSIS — D176 Benign lipomatous neoplasm of spermatic cord: Secondary | ICD-10-CM | POA: Diagnosis not present

## 2014-08-30 DIAGNOSIS — K4091 Unilateral inguinal hernia, without obstruction or gangrene, recurrent: Secondary | ICD-10-CM | POA: Diagnosis present

## 2014-08-30 DIAGNOSIS — K4020 Bilateral inguinal hernia, without obstruction or gangrene, not specified as recurrent: Secondary | ICD-10-CM

## 2014-08-30 HISTORY — PX: INSERTION OF MESH: SHX5868

## 2014-08-30 HISTORY — PX: LAPAROSCOPIC INGUINAL HERNIA WITH UMBILICAL HERNIA: SHX5658

## 2014-08-30 SURGERY — LAPAROSCOPIC INGUINAL HERNIA WITH UMBILICAL HERNIA
Anesthesia: General | Site: Abdomen

## 2014-08-30 MED ORDER — OXYCODONE HCL 5 MG PO TABS: 5.0000 mg | ORAL_TABLET | ORAL | Status: DC | PRN

## 2014-08-30 MED ORDER — OXYCODONE HCL 5 MG PO TABS
ORAL_TABLET | ORAL | Status: AC
  Filled 2014-08-30: qty 2

## 2014-08-30 MED ORDER — ONDANSETRON 8 MG/NS 50 ML IVPB: 8.0000 mg | Freq: Four times a day (QID) | INTRAVENOUS | Status: DC | PRN

## 2014-08-30 MED ORDER — MAGIC MOUTHWASH: 15.0000 mL | Freq: Four times a day (QID) | ORAL | Status: DC | PRN

## 2014-08-30 MED ORDER — METOPROLOL TARTRATE 12.5 MG HALF TABLET: 12.5000 mg | ORAL_TABLET | Freq: Two times a day (BID) | ORAL | Status: DC | PRN

## 2014-08-30 MED ORDER — DIPHENHYDRAMINE HCL 50 MG/ML IJ SOLN
INTRAMUSCULAR | Status: AC
Start: 2014-08-30 — End: 2014-08-30
  Filled 2014-08-30: qty 1

## 2014-08-30 MED ORDER — DIPHENHYDRAMINE HCL 50 MG/ML IJ SOLN
INTRAMUSCULAR | Status: DC | PRN
  Administered 2014-08-30: 10 mg via INTRAVENOUS

## 2014-08-30 MED ORDER — ONDANSETRON HCL 4 MG/2ML IJ SOLN
INTRAMUSCULAR | Status: AC
  Filled 2014-08-30: qty 2

## 2014-08-30 MED ORDER — LIDOCAINE HCL (CARDIAC) 20 MG/ML IV SOLN
INTRAVENOUS | Status: AC
  Filled 2014-08-30: qty 5

## 2014-08-30 MED ORDER — FENTANYL CITRATE 0.05 MG/ML IJ SOLN
INTRAMUSCULAR | Status: AC
  Filled 2014-08-30: qty 5

## 2014-08-30 MED ORDER — ACETAMINOPHEN 650 MG RE SUPP: 650.0000 mg | Freq: Four times a day (QID) | RECTAL | Status: DC | PRN

## 2014-08-30 MED ORDER — OXYCODONE HCL 5 MG PO TABS
5.0000 mg | ORAL_TABLET | ORAL | Status: DC | PRN
  Administered 2014-08-30: 10 mg via ORAL

## 2014-08-30 MED ORDER — OXYCODONE HCL 5 MG/5ML PO SOLN: 5.0000 mg | Freq: Once | ORAL | Status: DC | PRN

## 2014-08-30 MED ORDER — PROPOFOL 10 MG/ML IV BOLUS
INTRAVENOUS | Status: AC
  Filled 2014-08-30: qty 20

## 2014-08-30 MED ORDER — MENTHOL 3 MG MT LOZG: 1.0000 | LOZENGE | OROMUCOSAL | Status: DC | PRN

## 2014-08-30 MED ORDER — ROCURONIUM BROMIDE 50 MG/5ML IV SOLN
INTRAVENOUS | Status: AC
  Filled 2014-08-30: qty 1

## 2014-08-30 MED ORDER — METOPROLOL TARTRATE 1 MG/ML IV SOLN: 5.0000 mg | Freq: Four times a day (QID) | INTRAVENOUS | Status: DC | PRN

## 2014-08-30 MED ORDER — IBUPROFEN 400 MG PO TABS: 400.0000 mg | ORAL_TABLET | Freq: Four times a day (QID) | ORAL | Status: DC | PRN

## 2014-08-30 MED ORDER — PROMETHAZINE HCL 25 MG/ML IJ SOLN: 6.2500 mg | INTRAMUSCULAR | Status: DC | PRN

## 2014-08-30 MED ORDER — MEPERIDINE HCL 25 MG/ML IJ SOLN: 6.2500 mg | INTRAMUSCULAR | Status: DC | PRN

## 2014-08-30 MED ORDER — VECURONIUM BROMIDE 10 MG IV SOLR
INTRAVENOUS | Status: DC | PRN
  Administered 2014-08-30: 1 mg via INTRAVENOUS
  Administered 2014-08-30: 2 mg via INTRAVENOUS
  Administered 2014-08-30: 1 mg via INTRAVENOUS

## 2014-08-30 MED ORDER — VECURONIUM BROMIDE 10 MG IV SOLR
INTRAVENOUS | Status: AC
  Filled 2014-08-30: qty 10

## 2014-08-30 MED ORDER — LIP MEDEX EX OINT: 1.0000 "application " | TOPICAL_OINTMENT | Freq: Two times a day (BID) | CUTANEOUS | Status: DC

## 2014-08-30 MED ORDER — KETOROLAC TROMETHAMINE 30 MG/ML IJ SOLN
INTRAMUSCULAR | Status: DC | PRN
  Administered 2014-08-30: 30 mg via INTRAVENOUS

## 2014-08-30 MED ORDER — BUPIVACAINE-EPINEPHRINE 0.25% -1:200000 IJ SOLN
INTRAMUSCULAR | Status: DC | PRN
  Administered 2014-08-30 (×2): 30 mL

## 2014-08-30 MED ORDER — SODIUM CHLORIDE 0.9 % IR SOLN
Status: DC | PRN
  Administered 2014-08-30 (×3): 1000 mL

## 2014-08-30 MED ORDER — METHOCARBAMOL 750 MG PO TABS: 750.0000 mg | ORAL_TABLET | Freq: Four times a day (QID) | ORAL | Status: DC | PRN

## 2014-08-30 MED ORDER — GLYCOPYRROLATE 0.2 MG/ML IJ SOLN
INTRAMUSCULAR | Status: AC
  Filled 2014-08-30: qty 3

## 2014-08-30 MED ORDER — NEOSTIGMINE METHYLSULFATE 10 MG/10ML IV SOLN
INTRAVENOUS | Status: DC | PRN
  Administered 2014-08-30: 4 mg via INTRAVENOUS

## 2014-08-30 MED ORDER — BUPIVACAINE-EPINEPHRINE (PF) 0.25% -1:200000 IJ SOLN
INTRAMUSCULAR | Status: AC
  Filled 2014-08-30: qty 30

## 2014-08-30 MED ORDER — KETOROLAC TROMETHAMINE 30 MG/ML IJ SOLN
INTRAMUSCULAR | Status: AC
  Filled 2014-08-30: qty 1

## 2014-08-30 MED ORDER — LIDOCAINE HCL (CARDIAC) 20 MG/ML IV SOLN
INTRAVENOUS | Status: DC | PRN
  Administered 2014-08-30: 100 mg via INTRAVENOUS

## 2014-08-30 MED ORDER — PROPOFOL 10 MG/ML IV BOLUS
INTRAVENOUS | Status: DC | PRN
  Administered 2014-08-30: 150 mg via INTRAVENOUS

## 2014-08-30 MED ORDER — FENTANYL CITRATE 0.05 MG/ML IJ SOLN
INTRAMUSCULAR | Status: DC | PRN
  Administered 2014-08-30: 50 ug via INTRAVENOUS
  Administered 2014-08-30: 150 ug via INTRAVENOUS
  Administered 2014-08-30 (×2): 50 ug via INTRAVENOUS

## 2014-08-30 MED ORDER — ROCURONIUM BROMIDE 100 MG/10ML IV SOLN
INTRAVENOUS | Status: DC | PRN
  Administered 2014-08-30: 50 mg via INTRAVENOUS

## 2014-08-30 MED ORDER — ONDANSETRON HCL 4 MG/2ML IJ SOLN: 4.0000 mg | Freq: Once | INTRAMUSCULAR | Status: DC | PRN

## 2014-08-30 MED ORDER — ACETAMINOPHEN 325 MG PO TABS: 325.0000 mg | ORAL_TABLET | Freq: Four times a day (QID) | ORAL | Status: DC | PRN

## 2014-08-30 MED ORDER — 0.9 % SODIUM CHLORIDE (POUR BTL) OPTIME
TOPICAL | Status: DC | PRN
  Administered 2014-08-30: 1000 mL

## 2014-08-30 MED ORDER — PHENOL 1.4 % MT LIQD: 2.0000 | OROMUCOSAL | Status: DC | PRN

## 2014-08-30 MED ORDER — HYDROMORPHONE HCL 1 MG/ML IJ SOLN: 0.2500 mg | INTRAMUSCULAR | Status: DC | PRN

## 2014-08-30 MED ORDER — ALUM & MAG HYDROXIDE-SIMETH 200-200-20 MG/5ML PO SUSP: 30.0000 mL | Freq: Four times a day (QID) | ORAL | Status: DC | PRN

## 2014-08-30 MED ORDER — OXYCODONE HCL 5 MG PO TABS: 5.0000 mg | ORAL_TABLET | Freq: Once | ORAL | Status: DC | PRN

## 2014-08-30 MED ORDER — NEOSTIGMINE METHYLSULFATE 10 MG/10ML IV SOLN
INTRAVENOUS | Status: AC
  Filled 2014-08-30: qty 1

## 2014-08-30 MED ORDER — GLYCOPYRROLATE 0.2 MG/ML IJ SOLN
INTRAMUSCULAR | Status: DC | PRN
  Administered 2014-08-30: 0.6 mg via INTRAVENOUS

## 2014-08-30 MED ORDER — DIPHENHYDRAMINE HCL 50 MG/ML IJ SOLN: 12.5000 mg | Freq: Four times a day (QID) | INTRAMUSCULAR | Status: DC | PRN

## 2014-08-30 MED ORDER — MIDAZOLAM HCL 5 MG/5ML IJ SOLN
INTRAMUSCULAR | Status: DC | PRN
  Administered 2014-08-30: 2 mg via INTRAVENOUS

## 2014-08-30 MED ORDER — HYDROMORPHONE HCL 1 MG/ML IJ SOLN: 0.5000 mg | INTRAMUSCULAR | Status: DC | PRN

## 2014-08-30 MED ORDER — MIDAZOLAM HCL 2 MG/2ML IJ SOLN
INTRAMUSCULAR | Status: AC
  Filled 2014-08-30: qty 2

## 2014-08-30 MED ORDER — ONDANSETRON HCL 4 MG/2ML IJ SOLN: 4.0000 mg | Freq: Four times a day (QID) | INTRAMUSCULAR | Status: DC | PRN

## 2014-08-30 MED ORDER — LACTATED RINGERS IV SOLN
INTRAVENOUS | Status: DC | PRN
  Administered 2014-08-30: 07:00:00 via INTRAVENOUS

## 2014-08-30 MED ORDER — ONDANSETRON HCL 4 MG/2ML IJ SOLN
INTRAMUSCULAR | Status: DC | PRN
  Administered 2014-08-30: 4 mg via INTRAVENOUS

## 2014-08-30 SURGICAL SUPPLY — 47 items
APPLIER CLIP 5 13 M/L LIGAMAX5 (MISCELLANEOUS)
CANISTER SUCTION 2500CC (MISCELLANEOUS) IMPLANT
CHLORAPREP W/TINT 26ML (MISCELLANEOUS) ×3 IMPLANT
CLIP APPLIE 5 13 M/L LIGAMAX5 (MISCELLANEOUS) IMPLANT
COVER SURGICAL LIGHT HANDLE (MISCELLANEOUS) ×3 IMPLANT
DEVICE SECURE STRAP 25 ABSORB (INSTRUMENTS) IMPLANT
DRAPE LAPAROSCOPIC ABDOMINAL (DRAPES) ×3 IMPLANT
DRAPE WARM FLUID 44X44 (DRAPE) ×3 IMPLANT
DRSG TEGADERM 2-3/8X2-3/4 SM (GAUZE/BANDAGES/DRESSINGS) ×3 IMPLANT
DRSG TEGADERM 4X4.75 (GAUZE/BANDAGES/DRESSINGS) ×3 IMPLANT
ELECT REM PT RETURN 9FT ADLT (ELECTROSURGICAL) ×3
ELECTRODE REM PT RTRN 9FT ADLT (ELECTROSURGICAL) ×2 IMPLANT
GAUZE SPONGE 2X2 8PLY STRL LF (GAUZE/BANDAGES/DRESSINGS) ×2 IMPLANT
GLOVE BIO SURGEON STRL SZ7.5 (GLOVE) ×3 IMPLANT
GLOVE BIOGEL PI IND STRL 7.0 (GLOVE) ×2 IMPLANT
GLOVE BIOGEL PI IND STRL 8 (GLOVE) ×2 IMPLANT
GLOVE BIOGEL PI INDICATOR 7.0 (GLOVE) ×1
GLOVE BIOGEL PI INDICATOR 8 (GLOVE) ×1
GLOVE ECLIPSE 8.0 STRL XLNG CF (GLOVE) ×3 IMPLANT
GLOVE SURG SS PI 7.0 STRL IVOR (GLOVE) ×3 IMPLANT
GOWN STRL REUS W/ TWL LRG LVL3 (GOWN DISPOSABLE) ×4 IMPLANT
GOWN STRL REUS W/ TWL XL LVL3 (GOWN DISPOSABLE) ×2 IMPLANT
GOWN STRL REUS W/TWL LRG LVL3 (GOWN DISPOSABLE) ×2
GOWN STRL REUS W/TWL XL LVL3 (GOWN DISPOSABLE) ×1
KIT BASIN OR (CUSTOM PROCEDURE TRAY) ×3 IMPLANT
KIT ROOM TURNOVER OR (KITS) ×3 IMPLANT
MESH ULTRAPRO 6X6 15CM15CM (Mesh General) ×6 IMPLANT
NS IRRIG 1000ML POUR BTL (IV SOLUTION) ×3 IMPLANT
PAD ARMBOARD 7.5X6 YLW CONV (MISCELLANEOUS) ×6 IMPLANT
SCISSORS LAP 5X35 DISP (ENDOMECHANICALS) IMPLANT
SET IRRIG TUBING LAPAROSCOPIC (IRRIGATION / IRRIGATOR) ×3 IMPLANT
SLEEVE ENDOPATH XCEL 5M (ENDOMECHANICALS) ×3 IMPLANT
SPONGE GAUZE 2X2 STER 10/PKG (GAUZE/BANDAGES/DRESSINGS) ×1
SUT MNCRL AB 4-0 PS2 18 (SUTURE) ×3 IMPLANT
SUT NOVA NAB DX-16 0-1 5-0 T12 (SUTURE) IMPLANT
SUT NOVA NAB GS-21 0 18 T12 DT (SUTURE) IMPLANT
SUT PDS AB 0 CT 36 (SUTURE) ×3 IMPLANT
SUT VIC AB 3-0 SH 27 (SUTURE) ×2
SUT VIC AB 3-0 SH 27X BRD (SUTURE) ×4 IMPLANT
SUT VIC AB 3-0 SH 27XBRD (SUTURE) IMPLANT
TOWEL OR 17X24 6PK STRL BLUE (TOWEL DISPOSABLE) ×3 IMPLANT
TOWEL OR 17X26 10 PK STRL BLUE (TOWEL DISPOSABLE) ×3 IMPLANT
TRAY FOLEY CATH 16FR SILVER (SET/KITS/TRAYS/PACK) IMPLANT
TRAY LAPAROSCOPIC (CUSTOM PROCEDURE TRAY) ×3 IMPLANT
TROCAR XCEL BLUNT TIP 100MML (ENDOMECHANICALS) ×3 IMPLANT
TROCAR XCEL NON-BLD 5MMX100MML (ENDOMECHANICALS) ×3 IMPLANT
TUBING INSUFFLATION (TUBING) ×3 IMPLANT

## 2014-08-30 NOTE — Anesthesia Procedure Notes (Signed)
Procedure Name: Intubation Date/Time: 08/30/2014 7:39 AM Performed by: Shirlyn Goltz Pre-anesthesia Checklist: Patient identified, Emergency Drugs available, Suction available and Patient being monitored Patient Re-evaluated:Patient Re-evaluated prior to inductionOxygen Delivery Method: Circle system utilized Preoxygenation: Pre-oxygenation with 100% oxygen Intubation Type: IV induction Ventilation: Mask ventilation without difficulty and Oral airway inserted - appropriate to patient size Laryngoscope Size: Miller and 3 Grade View: Grade I Tube type: Oral Tube size: 7.0 mm Number of attempts: 1 Airway Equipment and Method: Stylet Placement Confirmation: ETT inserted through vocal cords under direct vision,  positive ETCO2 and breath sounds checked- equal and bilateral Secured at: 22 cm Tube secured with: Tape Dental Injury: Teeth and Oropharynx as per pre-operative assessment

## 2014-08-30 NOTE — Discharge Instructions (Signed)
HERNIA REPAIR: POST OP INSTRUCTIONS ° °1. DIET: Follow a light bland diet the first 24 hours after arrival home, such as soup, liquids, crackers, etc.  Be sure to include lots of fluids daily.  Avoid fast food or heavy meals as your are more likely to get nauseated.  Eat a low fat the next few days after surgery. °2. Take your usually prescribed home medications unless otherwise directed. °3. PAIN CONTROL: °a. Pain is best controlled by a usual combination of three different methods TOGETHER: °i. Ice/Heat °ii. Over the counter pain medication °iii. Prescription pain medication °b. Most patients will experience some swelling and bruising around the hernia(s) such as the bellybutton, groins, or old incisions.  Ice packs or heating pads (30-60 minutes up to 6 times a day) will help. Use ice for the first few days to help decrease swelling and bruising, then switch to heat to help relax tight/sore spots and speed recovery.  Some people prefer to use ice alone, heat alone, alternating between ice & heat.  Experiment to what works for you.  Swelling and bruising can take several weeks to resolve.   °c. It is helpful to take an over-the-counter pain medication regularly for the first few weeks.  Choose one of the following that works best for you: °i. Naproxen (Aleve, etc)  Two 220mg tabs twice a day °ii. Ibuprofen (Advil, etc) Three 200mg tabs four times a day (every meal & bedtime) °iii. Acetaminophen (Tylenol, etc) 325-650mg four times a day (every meal & bedtime) °d. A  prescription for pain medication should be given to you upon discharge.  Take your pain medication as prescribed.  °i. If you are having problems/concerns with the prescription medicine (does not control pain, nausea, vomiting, rash, itching, etc), please call us [phone] to see if we need to switch you to a different pain medicine that will work better for you and/or control your side effect better. °ii. If you need a refill on your pain  medication, please contact your pharmacy.  They will contact our office to request authorization. Prescriptions will not be filled after 5 pm or on week-ends. °4. Avoid getting constipated.  Between the surgery and the pain medications, it is common to experience some constipation.  Increasing fluid intake and taking a fiber supplement (such as Metamucil, Citrucel, FiberCon, MiraLax, etc) 1-2 times a day regularly will usually help prevent this problem from occurring.  A mild laxative (prune juice, Milk of Magnesia, MiraLax, etc) should be taken according to package directions if there are no bowel movements after 48 hours.   °5. Wash / shower every day.  You may shower over the dressings as they are waterproof.   °6. Remove your waterproof bandages 5 days after surgery.  You may leave the incision open to air.  You may replace a dressing/Band-Aid to cover the incision for comfort if you wish.  Continue to shower over incision(s) after the dressing is off. ° ° ° °7. ACTIVITIES as tolerated:   °a. You may resume regular (light) daily activities beginning the next day--such as daily self-care, walking, climbing stairs--gradually increasing activities as tolerated.  If you can walk 30 minutes without difficulty, it is safe to try more intense activity such as jogging, treadmill, bicycling, low-impact aerobics, swimming, etc. °b. Save the most intensive and strenuous activity for last such as sit-ups, heavy lifting, contact sports, etc  Refrain from any heavy lifting or straining until you are off narcotics for pain control.   °  c. DO NOT PUSH THROUGH PAIN.  Let pain be your guide: If it hurts to do something, don't do it.  Pain is your body warning you to avoid that activity for another week until the pain goes down. d. You may drive when you are no longer taking prescription pain medication, you can comfortably wear a seatbelt, and you can safely maneuver your car and apply brakes. e. Dennis Bast may have sexual intercourse  when it is comfortable.  8. FOLLOW UP in our office a. Please call [facility] at [phone] to set up an appointment to see your surgeon in the office for a follow-up appointment approximately 2-3 weeks after your surgery. b. Make sure that you call for this appointment the day you arrive home to insure a convenient appointment time. 9.  IF YOU HAVE DISABILITY OR FAMILY LEAVE FORMS, BRING THEM TO THE OFFICE FOR PROCESSING.  DO NOT GIVE THEM TO YOUR DOCTOR.  WHEN TO CALL us [phone]: 1. Poor pain control 2. Reactions / problems with new medications (rash/itching, nausea, etc)  3. Fever over 101.5 F (38.5 C) 4. Inability to urinate 5. Nausea and/or vomiting 6. Worsening swelling or bruising 7. Continued bleeding from incision. 8. Increased pain, redness, or drainage from the incision   The clinic staff is available to answer your questions during regular business hours (8:30am-5pm).  Please dont hesitate to call and ask to speak to one of our nurses for clinical concerns.   If you have a medical emergency, go to the nearest emergency room or call 911.  A surgeon from [facility] is always on call at the hospitals in [facility], [place], [place], [place], [place]  [address] ?  [address] [address], [place], [place]   [address] MAIN: [phone] ? TOLL FREE: [phone] ? FAX: [phone] [email or web address]  Managing Pain  Pain after surgery or related to activity is often due to strain/injury to muscle, tendon, nerves and/or incisions.  This pain is usually short-term and will improve in a few months.   Many people find it helpful to do the following things TOGETHER to help speed the process of healing and to get back to regular activity more quickly:  1. Avoid heavy physical activity a.  no lifting greater than 20 pounds b. Do not push through the pain.  Listen to your body and avoid positions and maneuvers than  reproduce the pain c. Walking is okay as tolerated, but go slowly and stop when getting sore.  d. Remember: If it hurts to do it, then dont do it! 2. Take Anti-inflammatory medication  a. Take with food/snack around the clock for 1-2 weeks i. This helps the muscle and nerve tissues become less irritable and calm down faster b. Choose ONE of the following over-the-counter medications: i. Naproxen 252m tabs (ex. Aleve) 1-2 pills twice a day  ii. Ibuprofen 2072mtabs (ex. Advil, Motrin) 3-4 pills with every meal and just before bedtime iii. Acetaminophen 50062mabs (Tylenol) 1-2 pills with every meal and just before bedtime 3. Use a Heating pad or Ice/Cold Pack a. 4-6 times a day b. May use warm bath/hottub  or showers 4. Try Gentle Massage and/or Stretching  a. at the area of pain many times a day b. stop if you feel pain - do not overdo it  Try these steps together to help you body heal faster and avoid making things get worse.  Doing just one of these  things may not be enough.    If you are not getting better after two weeks or are noticing you are getting worse, contact our office for further advice; we may need to re-evaluate you & see what other things we can do to help.  GETTING TO GOOD BOWEL HEALTH. Irregular bowel habits such as constipation and diarrhea can lead to many problems over time.  Having one soft bowel movement a day is the most important way to prevent further problems.  The anorectal canal is designed to handle stretching and feces to safely manage our ability to get rid of solid waste (feces, poop, stool) out of our body.  BUT, hard constipated stools can act like ripping concrete bricks and diarrhea can be a burning fire to this very sensitive area of our body, causing inflamed hemorrhoids, anal fissures, increasing risk is perirectal abscesses, abdominal pain/bloating, an making irritable bowel worse.     The goal: ONE SOFT BOWEL MOVEMENT A DAY!  To have soft, regular  bowel movements:    Drink at least 8 tall glasses of water a day.     Take plenty of fiber.  Fiber is the undigested part of plant food that passes into the colon, acting s natures broom to encourage bowel motility and movement.  Fiber can absorb and hold large amounts of water. This results in a larger, bulkier stool, which is soft and easier to pass. Work gradually over several weeks up to 6 servings a day of fiber (25g a day even more if needed) in the form of: o Vegetables -- Root (potatoes, carrots, turnips), leafy green (lettuce, salad greens, celery, spinach), or cooked high residue (cabbage, broccoli, etc) o Fruit -- Fresh (unpeeled skin & pulp), Dried (prunes, apricots, cherries, etc ),  or stewed ( applesauce)  o Whole grain breads, pasta, etc (whole wheat)  o Bran cereals    Bulking Agents -- This type of water-retaining fiber generally is easily obtained each day by one of the following:  o Psyllium bran -- The psyllium plant is remarkable because its ground seeds can retain so much water. This product is available as Metamucil, Konsyl, Effersyllium, Per Diem Fiber, or the less expensive generic preparation in drug and health food stores. Although labeled a laxative, it really is not a laxative.  o Methylcellulose -- This is another fiber derived from wood which also retains water. It is available as Citrucel. o Polyethylene Glycol - and artificial fiber commonly called Miralax or Glycolax.  It is helpful for people with gassy or bloated feelings with regular fiber o Flax Seed - a less gassy fiber than psyllium   No reading or other relaxing activity while on the toilet. If bowel movements take longer than 5 minutes, you are too constipated   AVOID CONSTIPATION.  High fiber and water intake usually takes care of this.  Sometimes a laxative is needed to stimulate more frequent bowel movements, but    Laxatives are not a good long-term solution as it can wear the colon out. o Osmotics  (Milk of Magnesia, Fleets phosphosoda, Magnesium citrate, MiraLax, GoLytely) are safer than  o Stimulants (Senokot, Castor Oil, Dulcolax, Ex Lax)    o Do not take laxatives for more than 7days in a row.    IF SEVERELY CONSTIPATED, try a Bowel Retraining Program: o Do not use laxatives.  o Eat a diet high in roughage, such as bran cereals and leafy vegetables.  o Drink six (6) ounces of prune or  apricot juice each morning.  o Eat two (2) large servings of stewed fruit each day.  o Take one (1) heaping tablespoon of a psyllium-based bulking agent twice a day. Use sugar-free sweetener when possible to avoid excessive calories.  o Eat a normal breakfast.  o Set aside 15 minutes after breakfast to sit on the toilet, but do not strain to have a bowel movement.  o If you do not have a bowel movement by the third day, use an enema and repeat the above steps.    Controlling diarrhea o Switch to liquids and simpler foods for a few days to avoid stressing your intestines further. o Avoid dairy products (especially milk & ice cream) for a short time.  The intestines often can lose the ability to digest lactose when stressed. o Avoid foods that cause gassiness or bloating.  Typical foods include beans and other legumes, cabbage, broccoli, and dairy foods.  Every person has some sensitivity to other foods, so listen to our body and avoid those foods that trigger problems for you. o Adding fiber (Citrucel, Metamucil, psyllium, Miralax) gradually can help thicken stools by absorbing excess fluid and retrain the intestines to act more normally.  Slowly increase the dose over a few weeks.  Too much fiber too soon can backfire and cause cramping & bloating. o Probiotics (such as active yogurt, Align, etc) may help repopulate the intestines and colon with normal bacteria and calm down a sensitive digestive tract.  Most studies show it to be of mild help, though, and such products can be costly. o Medicines:   Bismuth  subsalicylate (ex. Kayopectate, Pepto Bismol) every 30 minutes for up to 6 doses can help control diarrhea.  Avoid if pregnant.   Loperamide (Immodium) can slow down diarrhea.  Start with two tablets (4mg  total) first and then try one tablet every 6 hours.  Avoid if you are having fevers or severe pain.  If you are not better or start feeling worse, stop all medicines and call your doctor for advice o Call your doctor if you are getting worse or not better.  Sometimes further testing (cultures, endoscopy, X-ray studies, bloodwork, etc) may be needed to help diagnose and treat the cause of the diarrhea.   Inguinal Hernia, Adult Muscles help keep everything in the body in its proper place. But if a weak spot in the muscles develops, something can poke through. That is called a hernia. When this happens in the lower part of the belly (abdomen), it is called an inguinal hernia. (It takes its name from a part of the body in this region called the inguinal canal.) A weak spot in the wall of muscles lets some fat or part of the small intestine bulge through. An inguinal hernia can develop at any age. Men get them more often than women. CAUSES  In adults, an inguinal hernia develops over time.  It can be triggered by:  Suddenly straining the muscles of the lower abdomen.  Lifting heavy objects.  Straining to have a bowel movement. Difficult bowel movements (constipation) can lead to this.  Constant coughing. This may be caused by smoking or lung disease.  Being overweight.  Being pregnant.  Working at a job that requires long periods of standing or heavy lifting.  Having had an inguinal hernia before. One type can be an emergency situation. It is called a strangulated inguinal hernia. It develops if part of the small intestine slips through the weak spot and cannot get  back into the abdomen. The blood supply can be cut off. If that happens, part of the intestine may die. This situation requires  emergency surgery. SYMPTOMS  Often, a small inguinal hernia has no symptoms. It is found when a healthcare provider does a physical exam. Larger hernias usually have symptoms.   In adults, symptoms may include:  A lump in the groin. This is easier to see when the person is standing. It might disappear when lying down.  In men, a lump in the scrotum.  Pain or burning in the groin. This occurs especially when lifting, straining or coughing.  A dull ache or feeling of pressure in the groin.  Signs of a strangulated hernia can include:  A bulge in the groin that becomes very painful and tender to the touch.  A bulge that turns red or purple.  Fever, nausea and vomiting.  Inability to have a bowel movement or to pass gas. DIAGNOSIS  To decide if you have an inguinal hernia, a healthcare provider will probably do a physical examination.  This will include asking questions about any symptoms you have noticed.  The healthcare provider might feel the groin area and ask you to cough. If an inguinal hernia is felt, the healthcare provider may try to slide it back into the abdomen.  Usually no other tests are needed. TREATMENT  Treatments can vary. The size of the hernia makes a difference. Options include:  Watchful waiting. This is often suggested if the hernia is small and you have had no symptoms.  No medical procedure will be done unless symptoms develop.  You will need to watch closely for symptoms. If any occur, contact your healthcare provider right away.  Surgery. This is used if the hernia is larger or you have symptoms.  Open surgery. This is usually an outpatient procedure (you will not stay overnight in a hospital). An cut (incision) is made through the skin in the groin. The hernia is put back inside the abdomen. The weak area in the muscles is then repaired by herniorrhaphy or hernioplasty. Herniorrhaphy: in this type of surgery, the weak muscles are sewn back together.  Hernioplasty: a patch or mesh is used to close the weak area in the abdominal wall.  Laparoscopy. In this procedure, a surgeon makes small incisions. A thin tube with a tiny video camera (called a laparoscope) is put into the abdomen. The surgeon repairs the hernia with mesh by looking with the video camera and using two long instruments. HOME CARE INSTRUCTIONS   After surgery to repair an inguinal hernia:  You will need to take pain medicine prescribed by your healthcare provider. Follow all directions carefully.  You will need to take care of the wound from the incision.  Your activity will be restricted for awhile. This will probably include no heavy lifting for several weeks. You also should not do anything too active for a few weeks. When you can return to work will depend on the type of job that you have.  During "watchful waiting" periods, you should:  Maintain a healthy weight.  Eat a diet high in fiber (fruits, vegetables and whole grains).  Drink plenty of fluids to avoid constipation. This means drinking enough water and other liquids to keep your urine clear or pale yellow.  Do not lift heavy objects.  Do not stand for long periods of time.  Quit smoking. This should keep you from developing a frequent cough. SEEK MEDICAL CARE IF:   A  bulge develops in your groin area.  You feel pain, a burning sensation or pressure in the groin. This might be worse if you are lifting or straining.  You develop a fever of more than 100.5 F (38.1 C). SEEK IMMEDIATE MEDICAL CARE IF:   Pain in the groin increases suddenly.  A bulge in the groin gets bigger suddenly and does not go down.  For men, there is sudden pain in the scrotum. Or, the size of the scrotum increases.  A bulge in the groin area becomes red or purple and is painful to touch.  You have nausea or vomiting that does not go away.  You feel your heart beating much faster than normal.  You cannot have a bowel  movement or pass gas.  You develop a fever of more than 102.0 F (38.9 C). Document Released: 03/06/2009 Document Revised: 01/10/2012 Document Reviewed: 03/06/2009 [facility] Patient Information 2015 [place], [place]. This information is not intended to replace advice given to you by your health care provider. Make sure you discuss any questions you have with your health care provider.   What to eat:  For your first meals, you should eat lightly; only small meals initially.  If you do not have nausea, you may eat larger meals.  Avoid spicy, greasy and heavy food.    General Anesthesia, Adult, Care After  Refer to this sheet in the next few weeks. These instructions provide you with information on caring for yourself after your procedure. Your health care provider may also give you more specific instructions. Your treatment has been planned according to current medical practices, but problems sometimes occur. Call your health care provider if you have any problems or questions after your procedure.  WHAT TO EXPECT AFTER THE PROCEDURE  After the procedure, it is typical to experience:  Sleepiness.  Nausea and vomiting. HOME CARE INSTRUCTIONS  For the first 24 hours after general anesthesia:  Have a responsible person with you.  Do not drive a car. If you are alone, do not take public transportation.  Do not drink alcohol.  Do not take medicine that has not been prescribed by your health care provider.  Do not sign important papers or make important decisions.  You may resume a normal diet and activities as directed by your health care provider.  Change bandages (dressings) as directed.  If you have questions or problems that seem related to general anesthesia, call the hospital and ask for the anesthetist or anesthesiologist on call. SEEK MEDICAL CARE IF:  You have nausea and vomiting that continue the day after anesthesia.  You develop a rash. SEEK IMMEDIATE MEDICAL CARE IF:  You have  difficulty breathing.  You have chest pain.  You have any allergic problems. Document Released: 01/24/2001 Document Revised: 06/20/2013 Document Reviewed: 05/03/2013  [facility] Patient Information 2014 [place], [place].

## 2014-08-30 NOTE — Anesthesia Postprocedure Evaluation (Signed)
  Anesthesia Post-op Note  Patient: Christopher Lawrence  Procedure(s) Performed: Procedure(s): LAPAROSCOPIC EXPLORATION AND REPAIR OF BILATERAL HERNIAS IN  GROINS AND UMBILICAL HERNIA REPAIR. (N/A) INSERTION OF MESH (N/A)  Patient Location: PACU  Anesthesia Type:General  Level of Consciousness: awake and alert   Airway and Oxygen Therapy: Patient Spontanous Breathing and Patient connected to nasal cannula oxygen  Post-op Pain: mild  Post-op Assessment: Post-op Vital signs reviewed, Patient's Cardiovascular Status Stable and Respiratory Function Stable  Post-op Vital Signs: Reviewed and stable  Last Vitals:  Filed Vitals:   08/30/14 0619  BP: 124/80  Pulse: 67  Temp: 37.1 C  Resp: 18    Complications: No apparent anesthesia complications

## 2014-08-30 NOTE — H&P (Signed)
Christopher Lawrence  Location: [facility] Patient #: [id number] DOB: May 15, 1956 Married / Language: English / Race: White Male  History of Present Illness Christopher Lawrence; 07/24/2014 5:31 PM) Patient words: Pt here for possible hernia (inguinal) on the RIGHT.  The patient is a 58 year old male who presents with an inguinal hernia. Requested to evaluate patient for hernia by Dr. Unk Lawrence Active healthy male with pulling in right groin. Consultation requested for possible inguinal hernia. He comes in today by himself. He does recall being told he had hernia surgery in his right groin as an infant. Normally has a bowel movement every day. He walks 90 minutes a day. No history of infections. No abdominal surgeries. He does not the pain is worse with activity especially walking or flexing his right hip/raising his knee. No fevers chills or sweats. No nausea or vomiting.   Other Problems Christopher Lawrence; 07/24/2014 8:39 AM) Inguinal Hernia  Past Surgical History Christopher Lawrence; 07/24/2014 8:39 AM) Hemorrhoidectomy  Diagnostic Studies History Christopher Lawrence; 07/24/2014 8:39 AM) Colonoscopy 5-10 years ago  Allergies Christopher Lawrence [place]; 07/24/2014 8:41 AM) AndroGel *ANDROGENS-ANABOLIC* Christopher Lawrence Stings Wasp Stings  Medication History Christopher Lawrence; 07/24/2014 8:44 AM) Multivitamin (Oral daily) Active. Vitamin D (1000UNIT Tablet, 4 Oral daily) Active. BL Saw Palmetto (160MG  Capsule, Oral daily) Active. EPINEPHrine HCl (Anaphylaxis) (0.3 MG/0.3ML(1:1000) Device, Intramuscular as needed) Active.  Social History Christopher Lawrence; 07/24/2014 8:39 AM) Alcohol use Moderate alcohol use. Caffeine use Coffee. Illicit drug use Remotely quit drug use. Tobacco use Former smoker.  Family History Christopher Lawrence; 07/24/2014 8:39 AM) Colon Polyps Mother. Prostate Cancer Father. Respiratory Condition Father.  Review of Systems Christopher Lawrence  Christopher Lawrence; 07/24/2014 8:39 AM) General Not Present- Appetite Loss, Chills, Fatigue, Fever, Night Sweats, Weight Gain and Weight Loss. Skin Not Present- Change in Wart/Mole, Dryness, Hives, Jaundice, New Lesions, Non-Healing Wounds, Rash and Ulcer. HEENT Present- Wears glasses/contact lenses. Not Present- Earache, Hearing Loss, Hoarseness, Nose Bleed, Oral Ulcers, Ringing in the Ears, Seasonal Allergies, Sinus Pain, Sore Throat, Visual Disturbances and Yellow Eyes. Respiratory Not Present- Bloody sputum, Chronic Cough, Difficulty Breathing, Snoring and Wheezing. Breast Not Present- Breast Mass, Breast Pain, Nipple Discharge and Skin Changes. Cardiovascular Not Present- Chest Pain, Difficulty Breathing Lying Down, Leg Cramps, Palpitations, Rapid Heart Rate, Shortness of Breath and Swelling of Extremities. Gastrointestinal Not Present- Abdominal Pain, Bloating, Bloody Stool, Change in Bowel Habits, Chronic diarrhea, Constipation, Difficulty Swallowing, Excessive gas, Gets full quickly at meals, Hemorrhoids, Indigestion, Nausea, Rectal Pain and Vomiting. Male Genitourinary Not Present- Blood in Urine, Change in Urinary Stream, Frequency, Impotence, Nocturia, Painful Urination, Urgency and Urine Leakage. Musculoskeletal Not Present- Back Pain, Joint Pain, Joint Stiffness, Muscle Pain, Muscle Weakness and Swelling of Extremities. Neurological Not Present- Decreased Memory, Fainting, Headaches, Numbness, Seizures, Tingling, Tremor, Trouble walking and Weakness. Psychiatric Not Present- Anxiety, Bipolar, Change in Sleep Pattern, Depression, Fearful and Frequent crying. Endocrine Not Present- Cold Intolerance, Excessive Hunger, Hair Changes, Heat Intolerance, Hot flashes and New Diabetes. Hematology Not Present- Easy Bruising, Excessive bleeding, Gland problems, HIV and Persistent Infections.   Vitals Christopher Lawrence [place]; 07/24/2014 8:39 AM) 07/24/2014 8:39 AM Weight: 200.5 lb Height: 73.75in Body Surface  Area: 2.18 m Body Mass Index: 25.92 kg/m Temp.: 99.1F(Oral)  Pulse: 66 (Regular)  Resp.: 16 (Unlabored)  BP: 120/76 (Sitting, Left Arm, Standard)    Physical Exam Christopher Lawrence; 07/24/2014 9:33 AM) General Mental Status-Alert. General Appearance-Not in acute distress, Not Sickly. Orientation-Oriented X3. Hydration-Well hydrated. Voice-Normal.  Integumentary Global  Assessment Upon inspection and palpation of skin surfaces of the - Axillae: non-tender, no inflammation or ulceration, no drainage. and Distribution of scalp and body hair is normal. General Characteristics Temperature - normal warmth is noted.  Head and Neck Head-normocephalic, atraumatic with no lesions or palpable masses. Face Global Assessment - atraumatic, no absence of expression. Neck Global Assessment - no abnormal movements, no bruit auscultated on the right, no bruit auscultated on the left, no decreased range of motion, non-tender. Trachea-midline. Thyroid Gland Characteristics - non-tender.  Eye Eyeball - Left-Extraocular movements intact, No Nystagmus. Eyeball - Right-Extraocular movements intact, No Nystagmus. Cornea - Left-No Hazy. Cornea - Right-No Hazy. Sclera/Conjunctiva - Left-No scleral icterus, No Discharge. Sclera/Conjunctiva - Right-No scleral icterus, No Discharge. Pupil - Left-Direct reaction to light normal. Pupil - Right-Direct reaction to light normal.  ENMT Ears Pinna - Left - no drainage observed, no generalized tenderness observed. Right - no drainage observed, no generalized tenderness observed. Nose and Sinuses External Inspection of the Nose - no destructive lesion observed. Inspection of the nares - Left - quiet respiration. Right - quiet respiration. Mouth and Throat Lips - Upper Lip - no fissures observed, no pallor noted. Lower Lip - no fissures observed, no pallor noted. Nasopharynx - no discharge present. Oral  Cavity/Oropharynx - Tongue - no dryness observed. Oral Mucosa - no cyanosis observed. Hypopharynx - no evidence of airway distress observed.  Chest and Lung Exam Inspection Movements - Normal and Symmetrical. Accessory muscles - No use of accessory muscles in breathing. Palpation Palpation of the chest reveals - Non-tender. Auscultation Breath sounds - Normal and Clear.  Cardiovascular Auscultation Rhythm - Regular. Murmurs & Other Heart Sounds - Auscultation of the heart reveals - No Murmurs and No Systolic Clicks.  Abdomen Inspection Inspection of the abdomen reveals - No Visible peristalsis and No Abnormal pulsations. Hernias - Umbilical hernia - Reducible. Note: small 70mm umb hernia. sensitive. Inguinal hernia - Left - Note: Mild bulging. No definite hernia. Inguinal hernia - Right - Reducible. Umbilicus - No Bleeding, No Urine drainage. Palpation/Percussion Palpation and Percussion of the abdomen reveal - Soft, Non Tender, No Rebound tenderness, No Rigidity (guarding) and No Cutaneous hyperesthesia.  Male Genitourinary Sexual Maturity Tanner 5 - Adult hair pattern and Adult penile size and shape.  Peripheral Vascular Upper Extremity Inspection - Left - No Cyanotic nailbeds, Not Ischemic. Right - No Cyanotic nailbeds, Not Ischemic.  Neurologic Neurologic evaluation reveals -normal attention span and ability to concentrate, able to name objects and repeat phrases. Appropriate fund of knowledge , normal sensation and normal coordination. Mental Status Affect - not angry, not paranoid. Cranial Nerves-Normal Bilaterally. Gait-Normal.  Neuropsychiatric Mental status exam performed with findings of-able to articulate well with normal speech/language, rate, volume and coherence, thought content normal with ability to perform basic computations and apply abstract reasoning and no evidence of hallucinations, delusions, obsessions or homicidal/suicidal ideation. The patient's  mood and affect are described as -Note: Mildly flattened affect but pleasant.   Musculoskeletal Global Assessment Spine, Ribs and Pelvis - no instability, subluxation or laxity. Right Upper Extremity - no instability, subluxation or laxity.  Lymphatic Head & Neck  General Head & Neck Lymphatics: Bilateral - Description - No Localized lymphadenopathy. Axillary  General Axillary Region: Bilateral - Description - No Localized lymphadenopathy. Femoral & Inguinal  Generalized Femoral & Inguinal Lymphatics: Left - Description - No Localized lymphadenopathy. Right - Description - No Localized lymphadenopathy.    Assessment & Plan Christopher Lawrence; 07/24/2014 5:31 PM)  UNILATERAL RECURRENT INGUINAL HERNIA WITHOUT OBSTRUCTION OR GANGRENE (550.91  K40.91) Impression: RIH, plan lap exploration to r/o LIH as well. Primary umb hernia repair Current Plans  Schedule for Surgery:  The anatomy & physiology of the abdominal wall and pelvic floor was discussed. The pathophysiology of hernias in the inguinal and pelvic region was discussed. Natural history risks such as progressive enlargement, pain, incarceration & strangulation was discussed. Contributors to complications such as smoking, obesity, diabetes, prior surgery, etc were discussed.  I feel the risks of no intervention will lead to serious problems that outweigh the operative risks; therefore, I recommended surgery to reduce and repair the hernia. I explained laparoscopic techniques with possible need for an open approach. I noted usual use of mesh to patch and/or buttress hernia repair  Risks such as bleeding, infection, abscess, need for further treatment, heart attack, death, and other risks were discussed. I noted a good likelihood this will help address the problem. Goals of post-operative recovery were discussed as well. Possibility that this will not correct all symptoms was explained. I stressed the importance of low-impact  activity, aggressive pain control, avoiding constipation, & not pushing through pain to minimize risk of post-operative chronic pain or injury. Possibility of reherniation was discussed. We will work to minimize complications.  An educational handout further explaining the pathology & treatment options was given as well. Questions were answered. The patient expresses understanding & wishes to proceed with surgery. Pt Education - [facility] Pain Control (Roberth Berling) Pt Education - [facility] Good Bowel Health (Katalyna Socarras) Pt Education - Groin (Inguinal) Hernia Repair: groin hernia surgery UMBILICAL HERNIA WITHOUT OBSTRUCTION AND WITHOUT GANGRENE (553.1  K42.9) Impression: Plan primary repair of Umbilical hernia if found.    Signed by Christopher Hector, Lawrence

## 2014-08-30 NOTE — Anesthesia Preprocedure Evaluation (Addendum)
Anesthesia Evaluation  Patient identified by MRN, date of birth, ID band Patient awake    Reviewed: Allergy & Precautions, H&P , NPO status , Patient's Chart, lab work & pertinent test results  Airway Mallampati: II  TM Distance: >3 FB Neck ROM: Full  Mouth opening: Limited Mouth Opening  Dental  (+) Teeth Intact, Dental Advisory Given   Pulmonary former smoker,    Pulmonary exam normal       Cardiovascular hypertension, Pt. on medications     Neuro/Psych    GI/Hepatic GERD-  Medicated and Controlled,  Endo/Other    Renal/GU      Musculoskeletal   Abdominal   Peds  Hematology   Anesthesia Other Findings   Reproductive/Obstetrics                           Anesthesia Physical Anesthesia Plan  ASA: I  Anesthesia Plan: General   Post-op Pain Management:    Induction: Intravenous  Airway Management Planned: Oral ETT  Additional Equipment:   Intra-op Plan:   Post-operative Plan: Extubation in OR  Informed Consent: I have reviewed the patients History and Physical, chart, labs and discussed the procedure including the risks, benefits and alternatives for the proposed anesthesia with the patient or authorized representative who has indicated his/her understanding and acceptance.     Plan Discussed with: CRNA and Surgeon  Anesthesia Plan Comments:        Anesthesia Quick Evaluation

## 2014-08-30 NOTE — Interval H&P Note (Signed)
History and Physical Interval Note:  08/30/2014 7:20 AM  Christopher Lawrence  has presented today for surgery, with the diagnosis of Recurrent R Inguinal Hernia, poss Left ing hernia, umbilical hernia  The various methods of treatment have been discussed with the patient and family. After consideration of risks, benefits and other options for treatment, the patient has consented to  Procedure(s): LAPAROSCOPIC EXPLORATION AND REPAIR OF HERNIAS IN RIGHT AND POSSIBLY LEFT GROINS. PRIMARY UMBILICAL HERNIA REPAIR. (N/A) INSERTION OF MESH (N/A) as a surgical intervention .  The patient's history has been reviewed, patient examined, no change in status, stable for surgery.  I have reviewed the patient's chart and labs.  Questions were answered to the patient's satisfaction.     [name] C.

## 2014-08-30 NOTE — Transfer of Care (Signed)
Immediate Anesthesia Transfer of Care Note  Patient: Christopher Lawrence  Procedure(s) Performed: Procedure(s): LAPAROSCOPIC EXPLORATION AND REPAIR OF BILATERAL HERNIAS IN  GROINS AND UMBILICAL HERNIA REPAIR. (N/A) INSERTION OF MESH (N/A)  Patient Location: PACU  Anesthesia Type:General  Level of Consciousness: awake, alert , oriented and patient cooperative  Airway & Oxygen Therapy: Patient Spontanous Breathing and Patient connected to nasal cannula oxygen  Post-op Assessment: Report given to PACU RN, Post -op Vital signs reviewed and stable and Patient moving all extremities X 4  Post vital signs: Reviewed and stable  Complications: No apparent anesthesia complications

## 2014-08-30 NOTE — Op Note (Addendum)
08/30/2014  9:38 AM  PATIENT:  Christopher Lawrence  58 y.o. male  Patient Care Team: Unk Pinto, MD as PCP - General (Internal Medicine) Festus Aloe, MD as Consulting Physician (Urology) Mayme Genta, MD as Consulting Physician (Gastroenterology)  PRE-OPERATIVE DIAGNOSIS:  Recurrent R Inguinal Hernia, poss Left ing hernia, umbilical hernia  POST-OPERATIVE DIAGNOSIS:  Recurrent R Inguinal Hernia, Left ing hernia, umbilical hernia  PROCEDURE:  Procedure(s): LAPAROSCOPIC EXPLORATION AND REPAIR OF BILATERAL HERNIAS (right recurrent, left initial) PRIMARY UMBILICAL HERNIA REPAIR. INSERTION OF MESH  SURGEON:  Surgeon(s): Fin Boston, MD  ASSISTANT: RN   ANESTHESIA:   local and general  EBL:  Total I/O In: -  Out: 55 [Blood:50]  Delay start of Pharmacological VTE agent (>24hrs) due to surgical blood loss or risk of bleeding:  no  DRAINS: none   SPECIMEN:  No Specimen  DISPOSITION OF SPECIMEN:  N/A  COUNTS:  YES  PLAN OF CARE: Discharge to home after PACU  PATIENT DISPOSITION:  PACU - hemodynamically stable.  INDICATION:  Patient with recurrent RIH & small LIH & umbilical hernia.  I recommended surgical repair:  The anatomy & physiology of the abdominal wall and pelvic floor was discussed.  The pathophysiology of hernias in the inguinal and pelvic region was discussed.  Natural history risks such as progressive enlargement, pain, incarceration & strangulation was discussed.   Contributors to complications such as smoking, obesity, diabetes, prior surgery, etc were discussed.    I feel the risks of no intervention will lead to serious problems that outweigh the operative risks; therefore, I recommended surgery to reduce and repair the hernia.  I explained laparoscopic techniques with possible need for an open approach.  I noted usual use of mesh to patch and/or buttress hernia repair  Risks such as bleeding, infection, abscess, need for further treatment, heart  attack, death, and other risks were discussed.  I noted a good likelihood this will help address the problem.   Goals of post-operative recovery were discussed as well.  Possibility that this will not correct all symptoms was explained.  I stressed the importance of low-impact activity, aggressive pain control, avoiding constipation, & not pushing through pain to minimize risk of post-operative chronic pain or injury. Possibility of reherniation was discussed.  We will work to minimize complications.     An educational handout further explaining the pathology & treatment options was given as well.  Questions were answered.  The patient expresses understanding & wishes to proceed with surgery.  OR FINDINGS: Recurrent indirect right hernia with moderate scarring consistent with prior pediatric high ligation as child.  Smaller indirect inguinal hernia on left side.  1 cm umbilical hernia through stalk.  DESCRIPTION:   The patient was identified & brought into the operating room. The patient was positioned supine with arms tucked. SCDs were active during the entire case. The patient underwent general anesthesia without any difficulty.  The abdomen was prepped and draped in a sterile fashion. The patient's bladder was emptied.  A Surgical Timeout confirmed our plan.  I made a transverse incision through the inferior umbilical fold.  I made a small transverse nick through the anterior rectus fascia contralateral to the inguinal hernia side and placed a 0-vicryl stitch through the fascia.  I placed a Hasson trocar into the preperitoneal plane.  Entry was clean.  We induced carbon dioxide insufflation. Camera inspection revealed no injury.  I used a 26mm angled scope to bluntly free the peritoneum off the infraumbilical anterior  abdominal wall.  I created enough of a preperitoneal pocket to place 43mm ports into the right & left mid-abdomen into this preperitoneal cavity.  I focused attention on the right  side since that was the dominant hernia side.   I used blunt & focused sharp dissection to free the peritoneum off the flank and down to the pubic rim.  I freed the anteriolateral bladder wall off the anteriolateral pelvic wall, sparing midline attachments.   I located a swath of peritoneum going into a hernia fascial defect at the internal ring consistent with an indirect  hernia.  I gradually freed the peritoneal hernia sac off safely and reduced it into the preperitoneal space.  It was quite scarred in.  Had to carefully skeletonize off the spermatic vessels and vas deferens.  End of having to use a cardiac controlled scissors to get through the scar sac.  Also did skeletonize and morcellate a moderate spermatic cord lipoma.   I freed the peritoneum off the spermatic vessels & vas deferens.  I freed peritoneum off the retroperitoneum along the psoas muscle.    I checked & assured hemostasis.    I turned attention on the opposite side.  I did dissection in a similar, mirror-image fashion. The patient had a smaller unscarred lift inguinal hernia.      In freeing off the hernia sacs I did have a breech in the peritoneum on the enlarged right peritoneal inguinal hernia sac..  I closed that using absorbable Vicryl stitch using laparoscopic intracorporeal suturing.  This help provide a good high ligation of redundant peritoneal hernia sac tissue.  I chose 15x15 cm sheets of ultra-lightweight polypropylene mesh (Ultrapro), one for each side.  I cut a single sigmoid-shaped slit ~6cm from a corner of each mesh.  I placed the meshes into the preperitoneal space & laid them as overlapping diamonds such that at the inferior points, a 6x6 cm corner flap rested in the true anterolateral pelvis, covering the obturator & femoral foramina.   I allowed the bladder to fall back and help tuck the corners of the mesh in.  The medial corners overlapped each other across midline cephalad to the pubic rim.   This provided >2 inch  coverage around the hernia.  Because the defects well covered and not particularly large, I did not need tacks to hold the mesh in place  I held the hernia sacs cephalad & evacuated carbon dioxide.  I freed the umbilical stalk from the anterior abdominal wall fascia and identified a 1 cm umbilical hernia with some omentum in it.  It was reducible.  I primary close this with 0 PDS interrupted suture.  I closed the initial left periumbilical fascial defect with 0 Vicryl interrupted suture.  I re-tacked the umbilical stalk to the fascia with Monocryl suture.  I closed the skin using 4-0 monocryl stitch.  Sterile dressings were applied. The patient was extubated & arrived in the PACU in stable condition..  I had discussed postoperative care with the patient in the holding area.   I did discuss operative findings and postoperative goals / instructions to his significant other as well.  Instructions are written in the chart.  Adin Hector, M.D., F.A.C.S. Gastrointestinal and Minimally Invasive Surgery [facility] [address] [place], [place] [address] [phone] Main / Paging

## 2014-09-02 ENCOUNTER — Encounter (HOSPITAL_COMMUNITY): Payer: Self-pay | Admitting: Surgery

## 2015-07-31 ENCOUNTER — Other Ambulatory Visit: Payer: Self-pay | Admitting: *Deleted

## 2015-07-31 ENCOUNTER — Ambulatory Visit (INDEPENDENT_AMBULATORY_CARE_PROVIDER_SITE_OTHER): Payer: Managed Care, Other (non HMO) | Admitting: Internal Medicine

## 2015-07-31 ENCOUNTER — Encounter: Payer: Self-pay | Admitting: Internal Medicine

## 2015-07-31 VITALS — BP 132/84 | HR 72 | Temp 97.9°F | Resp 16 | Ht 74.25 in | Wt 200.2 lb

## 2015-07-31 DIAGNOSIS — E559 Vitamin D deficiency, unspecified: Secondary | ICD-10-CM

## 2015-07-31 DIAGNOSIS — Z1212 Encounter for screening for malignant neoplasm of rectum: Secondary | ICD-10-CM

## 2015-07-31 DIAGNOSIS — R5383 Other fatigue: Secondary | ICD-10-CM

## 2015-07-31 DIAGNOSIS — R7309 Other abnormal glucose: Secondary | ICD-10-CM | POA: Insufficient documentation

## 2015-07-31 DIAGNOSIS — Z23 Encounter for immunization: Secondary | ICD-10-CM | POA: Diagnosis not present

## 2015-07-31 DIAGNOSIS — Z79899 Other long term (current) drug therapy: Secondary | ICD-10-CM

## 2015-07-31 DIAGNOSIS — I10 Essential (primary) hypertension: Secondary | ICD-10-CM | POA: Diagnosis not present

## 2015-07-31 DIAGNOSIS — Z0001 Encounter for general adult medical examination with abnormal findings: Secondary | ICD-10-CM

## 2015-07-31 DIAGNOSIS — E782 Mixed hyperlipidemia: Secondary | ICD-10-CM

## 2015-07-31 DIAGNOSIS — R0989 Other specified symptoms and signs involving the circulatory and respiratory systems: Secondary | ICD-10-CM

## 2015-07-31 DIAGNOSIS — Z125 Encounter for screening for malignant neoplasm of prostate: Secondary | ICD-10-CM

## 2015-07-31 DIAGNOSIS — Z6825 Body mass index (BMI) 25.0-25.9, adult: Secondary | ICD-10-CM

## 2015-07-31 DIAGNOSIS — Z0000 Encounter for general adult medical examination without abnormal findings: Secondary | ICD-10-CM

## 2015-07-31 DIAGNOSIS — Z111 Encounter for screening for respiratory tuberculosis: Secondary | ICD-10-CM

## 2015-07-31 DIAGNOSIS — E349 Endocrine disorder, unspecified: Secondary | ICD-10-CM

## 2015-07-31 DIAGNOSIS — K219 Gastro-esophageal reflux disease without esophagitis: Secondary | ICD-10-CM

## 2015-07-31 LAB — BASIC METABOLIC PANEL WITH GFR
BUN: 17 mg/dL (ref 7–25)
CALCIUM: 9.4 mg/dL (ref 8.6–10.3)
CO2: 23 mmol/L (ref 20–31)
Chloride: 104 mmol/L (ref 98–110)
Creat: 0.71 mg/dL (ref 0.70–1.33)
GLUCOSE: 102 mg/dL — AB (ref 65–99)
POTASSIUM: 4.3 mmol/L (ref 3.5–5.3)
SODIUM: 136 mmol/L (ref 135–146)

## 2015-07-31 LAB — CBC WITH DIFFERENTIAL/PLATELET
BASOS ABS: 0 10*3/uL (ref 0.0–0.1)
BASOS PCT: 1 % (ref 0–1)
Eosinophils Absolute: 0.2 10*3/uL (ref 0.0–0.7)
Eosinophils Relative: 5 % (ref 0–5)
HEMATOCRIT: 44.3 % (ref 39.0–52.0)
HEMOGLOBIN: 15.5 g/dL (ref 13.0–17.0)
Lymphocytes Relative: 43 % (ref 12–46)
Lymphs Abs: 1.9 10*3/uL (ref 0.7–4.0)
MCH: 31 pg (ref 26.0–34.0)
MCHC: 35 g/dL (ref 30.0–36.0)
MCV: 88.6 fL (ref 78.0–100.0)
MPV: 9.8 fL (ref 8.6–12.4)
Monocytes Absolute: 0.4 10*3/uL (ref 0.1–1.0)
Monocytes Relative: 8 % (ref 3–12)
NEUTROS ABS: 1.9 10*3/uL (ref 1.7–7.7)
Neutrophils Relative %: 43 % (ref 43–77)
Platelets: 187 10*3/uL (ref 150–400)
RBC: 5 MIL/uL (ref 4.22–5.81)
RDW: 13.9 % (ref 11.5–15.5)
WBC: 4.5 10*3/uL (ref 4.0–10.5)

## 2015-07-31 LAB — HEPATIC FUNCTION PANEL
ALT: 26 U/L (ref 9–46)
AST: 18 U/L (ref 10–35)
Albumin: 4.3 g/dL (ref 3.6–5.1)
Alkaline Phosphatase: 72 U/L (ref 40–115)
BILIRUBIN DIRECT: 0.1 mg/dL (ref <=0.2)
Indirect Bilirubin: 0.3 mg/dL (ref 0.2–1.2)
TOTAL PROTEIN: 6.6 g/dL (ref 6.1–8.1)
Total Bilirubin: 0.4 mg/dL (ref 0.2–1.2)

## 2015-07-31 LAB — IRON AND TIBC
%SAT: 18 % (ref 15–60)
Iron: 67 ug/dL (ref 50–180)
TIBC: 376 ug/dL (ref 250–425)
UIBC: 309 ug/dL (ref 125–400)

## 2015-07-31 LAB — LIPID PANEL
CHOL/HDL RATIO: 2.2 ratio (ref <=5.0)
CHOLESTEROL: 167 mg/dL (ref 125–200)
HDL: 75 mg/dL (ref >=40)
LDL CALC: 81 mg/dL (ref <130)
TRIGLYCERIDES: 53 mg/dL (ref <150)
VLDL: 11 mg/dL (ref <30)

## 2015-07-31 LAB — MAGNESIUM: Magnesium: 1.9 mg/dL (ref 1.5–2.5)

## 2015-07-31 MED ORDER — CLOBETASOL PROPIONATE 0.05 % EX OINT: 1.0000 "application " | TOPICAL_OINTMENT | Freq: Two times a day (BID) | CUTANEOUS | Status: DC

## 2015-07-31 NOTE — Patient Instructions (Signed)

## 2015-07-31 NOTE — Progress Notes (Signed)
Patient ID: Christopher Lawrence, male   DOB: 11-19-1955, 59 y.o.   MRN: [id number]   Comprehensive Examination  This very nice 58 y.o.male presents for complete physical.  Patient has been followed for HTN, T2_NIDDM  Prediabetes, Hyperlipidemia, and Vitamin D Deficiency.   Patient has hx/o labile HTN predating  since 2003 and is followed expectantly. Patient's BP has been controlled at home.Today's BP: 132/84 mmHg. Patient denies any cardiac symptoms as chest pain, palpitations, shortness of breath, dizziness or ankle swelling.   Patient's hyperlipidemia is controlled with diet and medications. Patient denies myalgias or other medication SE's. Last lipids were At goal as below: Lab Results  Component Value Date   CHOL 155 07/29/2014   HDL 66 07/29/2014   LDLCALC 72 07/29/2014   TRIG 87 07/29/2014   CHOLHDL 2.3 07/29/2014    Patient is screened for prediabetes  and patient denies reactive hypoglycemic symptoms, visual blurring, diabetic polys or paresthesias. Last A1c was 5.4% and with concomitant slightly elevated insulin level of 21+ , suspect for insulin resistance in Sept 2015.     Finally, patient has history of Vitamin D Deficiency of 44 on treatment in 2008 and last vitamin D was at goal  -  90 in Sept 2015.   Medication Sig  . VITAMIN D  Take 2,000 Units by mouth 2 (two) times daily.   Marland Kitchen EPINEPHrine 0.3 mg/0.3 mL IJ SOAJ injection Inject 0.3 mg into the muscle once.  . Multiple Vitamins-Minerals  Take 1 tablet by mouth daily.   . ranitidine (ZANTAC) 300 MG tablet Take 300 mg by mouth daily as needed for heartburn.  . clobetasol ointment (TEMOVATE) 9.56 % Apply 1 application topically 2 (two) times daily.  . methocarbamol 750 MG tablet Take 1 tablet (750 mg total) by mouth 4 (four) times daily as needed (use for muscle cramps/pain).   Allergies  Allergen Reactions  . Wasp Venom Anaphylaxis    Wasp/Hornet/Bee  . Androgel [Testosterone] Other (See Comments)    headaches   Past Medical  History  Diagnosis Date  . GERD (gastroesophageal reflux disease)   . Other testicular hypofunction   . Psoriasis   . Labile hypertension     pt states that he no longer has a problem with HTN, takes no meds for HTN   Health Maintenance  Topic Date Due  . Hepatitis C Screening  11-08-1955  . HIV Screening  06/07/1971  . COLONOSCOPY  06/06/2006  . INFLUENZA VACCINE  06/02/2015  . TETANUS/TDAP  11/01/2016   Immunization History  Administered Date(s) Administered  . Influenza Split 07/19/2013, 07/29/2014, 07/31/2015  . PPD Test 07/29/2014, 07/31/2015  . Pneumococcal Polysaccharide-23 11/01/2008  . Td 11/01/2006   Past Surgical History  Procedure Laterality Date  . Hemorrhoid banding  04/2011  . Hernia repair Right 1958  . Colonoscopy    . Laparoscopic inguinal hernia with umbilical hernia N/A 21/30/8657    Procedure: LAPAROSCOPIC EXPLORATION AND REPAIR OF BILATERAL HERNIAS IN  GROINS AND UMBILICAL HERNIA REPAIR.;  Surgeon: Celvin Boston, MD;  Location: [facility];  Service: General;  Laterality: N/A;  . Insertion of mesh N/A 08/30/2014    Procedure: INSERTION OF MESH;  Surgeon: Adain Boston, MD;  Location: [place];  Service: General;  Laterality: N/A;   Family History  Problem Relation Age of Onset  . Hypertension Mother   . Colon polyps Mother   . Cancer Mother     Uterine  . COPD Father   . Arthritis Father   .  Cancer Father     Prostate   Social History   Social History  . Marital Status: Married    Spouse Name: N/A  . Number of Children: N/A  . Years of Education: N/A   Occupational History  . Unemployed Doctor, general practice / bartender   Social History Main Topics  . Smoking status: Former Research scientist (life sciences)  . Smokeless tobacco: Never Used  . Alcohol Use: Yes     Comment: occasional wine  . Drug Use: No  . Sexual Activity:     ROS Constitutional: Denies fever, chills, weight loss/gain, headaches, insomnia,  night sweats or change in appetite. Does c/o fatigue. Eyes: Denies redness,  blurred vision, diplopia, discharge, itchy or watery eyes.  ENT: Denies discharge, congestion, post nasal drip, epistaxis, sore throat, earache, hearing loss, dental pain, Tinnitus, Vertigo, Sinus pain or snoring.  Cardio: Denies chest pain, palpitations, irregular heartbeat, syncope, dyspnea, diaphoresis, orthopnea, PND, claudication or edema Respiratory: denies cough, dyspnea, DOE, pleurisy, hoarseness, laryngitis or wheezing.  Gastrointestinal: Denies dysphagia, heartburn, reflux, water brash, pain, cramps, nausea, vomiting, bloating, diarrhea, constipation, hematemesis, melena, hematochezia, jaundice or hemorrhoids Genitourinary: Denies dysuria, frequency, urgency, nocturia, hesitancy, discharge, hematuria or flank pain Musculoskeletal: Denies arthralgia, myalgia, stiffness, Jt. Swelling, pain, limp or strain/sprain. Denies Falls. Skin: Denies puritis, rash, hives, warts, acne, eczema or change in skin lesion Neuro: No weakness, tremor, incoordination, spasms, paresthesia or pain Psychiatric: Denies confusion, memory loss or sensory loss. Denies Depression. Endocrine: Denies change in weight, skin, hair change, nocturia, and paresthesia, diabetic polys, visual blurring or hyper / hypo glycemic episodes.  Heme/Lymph: No excessive bleeding, bruising or enlarged lymph nodes.  Physical Exam  BP 132/84 mmHg  Pulse 72  Temp(Src) 97.9 F (36.6 C)  Resp 16  Ht 6' 2.25" (1.886 m)  Wt 200 lb 3.2 oz (90.81 kg)  BMI 25.53 kg/m2  General Appearance: Well nourished &  in no apparent distress. Eyes: PERRLA, EOMs, conjunctiva no swelling or erythema, normal fundi and vessels. Sinuses: No frontal/maxillary tenderness ENT/Mouth: EACs patent / TMs  nl. Nares clear without erythema, swelling, mucoid exudates. Oral hygiene is good. No erythema, swelling, or exudate. Tongue normal, non-obstructing. Tonsils not swollen or erythematous. Hearing normal.  Neck: Supple, thyroid normal. No bruits, nodes or  JVD. Respiratory: Respiratory effort normal.  BS equal and clear bilateral without rales, rhonci, wheezing or stridor. Cardio: Heart sounds are normal with regular rate and rhythm and no murmurs, rubs or gallops. Peripheral pulses are normal and equal bilaterally without edema. No aortic or femoral bruits. Chest: symmetric with normal excursions and percussion.  Abdomen: Flat, soft, with bowel sounds. Nontender, no guarding, rebound, hernias, masses, or organomegaly.  Lymphatics: Non tender without lymphadenopathy.  Genitourinary: No hernias.Testes nl. DRE - prostate nl for age - smooth & firm w/o nodules. Musculoskeletal: Full ROM all peripheral extremities, joint stability, 5/5 strength, and normal gait. Skin: Warm and dry without rashes, lesions, cyanosis, clubbing or  ecchymosis.  Neuro: Cranial nerves intact, reflexes equal bilaterally. Normal muscle tone, no cerebellar symptoms. Sensation intact.  Pysch: Alert and oriented X 3 with normal affect, insight and judgment appropriate.   Assessment and Plan  1. Encounter for general adult medical examination with abnormal findings  - OVER THE COUNTER MEDICATION; Saw Palmetto 2 tabs daily - Microalbumin / creatinine urine ratio - EKG 12-Lead - [place], RETROPERITNL ABD,  LTD - POC Hemoccult Bld/Stl  - Vitamin B12 - PSA - Testosterone - Iron and TIBC - Urinalysis, Routine w reflex microscopic  - CBC  with Differential/Platelet - BASIC METABOLIC PANEL WITH GFR - Hepatic function panel - Magnesium - Lipid panel - TSH - Hemoglobin A1c - Insulin, random - Vit D  25 hydroxy  - Flu vaccine > 3yo with preservative IM (Fluvirin Influenza Split) - PPD  2. Labile hypertension  - Microalbumin / creatinine urine ratio - EKG 12-Lead - [place], RETROPERITNL ABD,  LTD - TSH  3. Mixed hyperlipidemia  - Lipid panel  4. Abnormal glucose  - Hemoglobin A1c - Insulin, random  5. Vitamin D deficiency  - Vit D  25 hydroxy   6. Testosterone  deficiency  - PSA  7. Gastroesophageal reflux disease   8. Screening for rectal cancer  - POC Hemoccult Bld/Stl   9. Prostate cancer screening  - Vitamin B12  10. Other fatigue  - Testosterone - Iron and TIBC - TSH  11. Need for prophylactic vaccination and inoculation against influenza  - Flu vaccine > 3yo with preservative IM (Fluvirin Influenza Split)  12. Screening examination for pulmonary tuberculosis  - PPD  13. Medication management  - Urinalysis, Routine w reflex microscopic (not at [facility]); Future - CBC with Differential/Platelet - BASIC METABOLIC PANEL WITH GFR - Hepatic function panel - Magnesium   Continue prudent diet as discussed, weight control, BP monitoring, regular exercise, and medications as discussed.  Discussed med effects and SE's. Routine screening labs and tests as requested with regular follow-up as recommended.  Over 40 minutes of exam, counseling &  chart review was performed

## 2015-08-01 LAB — INSULIN, RANDOM: Insulin: 9.6 u[IU]/mL (ref 2.0–19.6)

## 2015-08-01 LAB — VITAMIN B12: VITAMIN B 12: 633 pg/mL (ref 211–911)

## 2015-08-01 LAB — VITAMIN D 25 HYDROXY (VIT D DEFICIENCY, FRACTURES): Vit D, 25-Hydroxy: 74 ng/mL (ref 30–100)

## 2015-08-01 LAB — MICROALBUMIN / CREATININE URINE RATIO
Creatinine, Urine: 16.7 mg/dL
Microalb, Ur: <0.2 mg/dL (ref <2.0)

## 2015-08-01 LAB — TESTOSTERONE: Testosterone: 208 ng/dL — ABNORMAL LOW (ref 300–890)

## 2015-08-01 LAB — PSA: PSA: 1.68 ng/mL (ref <=4.00)

## 2015-08-01 LAB — HEMOGLOBIN A1C
Hgb A1c MFr Bld: 5.3 % (ref <5.7)
MEAN PLASMA GLUCOSE: 105 mg/dL (ref <117)

## 2015-08-01 LAB — TSH: TSH: 2.591 u[IU]/mL (ref 0.350–4.500)

## 2015-08-04 LAB — TB SKIN TEST
Induration: 0 mm
TB Skin Test: NEGATIVE

## 2015-08-15 ENCOUNTER — Other Ambulatory Visit: Payer: Self-pay | Admitting: *Deleted

## 2015-08-15 DIAGNOSIS — Z0001 Encounter for general adult medical examination with abnormal findings: Secondary | ICD-10-CM

## 2015-08-15 DIAGNOSIS — Z1212 Encounter for screening for malignant neoplasm of rectum: Secondary | ICD-10-CM

## 2015-08-15 LAB — POC HEMOCCULT BLD/STL (HOME/3-CARD/SCREEN)
Card #2 Fecal Occult Blod, POC: NEGATIVE
Card #3 Fecal Occult Blood, POC: NEGATIVE
Fecal Occult Blood, POC: NEGATIVE

## 2016-08-10 ENCOUNTER — Other Ambulatory Visit: Payer: Self-pay | Admitting: Internal Medicine

## 2016-08-26 ENCOUNTER — Encounter: Payer: Self-pay | Admitting: Internal Medicine

## 2016-08-26 ENCOUNTER — Ambulatory Visit (INDEPENDENT_AMBULATORY_CARE_PROVIDER_SITE_OTHER): Payer: Managed Care, Other (non HMO) | Admitting: Internal Medicine

## 2016-08-26 VITALS — BP 124/80 | HR 72 | Temp 97.5°F | Resp 16 | Ht 74.75 in | Wt 197.4 lb

## 2016-08-26 DIAGNOSIS — Z23 Encounter for immunization: Secondary | ICD-10-CM | POA: Diagnosis not present

## 2016-08-26 DIAGNOSIS — R5383 Other fatigue: Secondary | ICD-10-CM

## 2016-08-26 DIAGNOSIS — Z136 Encounter for screening for cardiovascular disorders: Secondary | ICD-10-CM | POA: Diagnosis not present

## 2016-08-26 DIAGNOSIS — E782 Mixed hyperlipidemia: Secondary | ICD-10-CM

## 2016-08-26 DIAGNOSIS — Z0001 Encounter for general adult medical examination with abnormal findings: Secondary | ICD-10-CM

## 2016-08-26 DIAGNOSIS — Z79899 Other long term (current) drug therapy: Secondary | ICD-10-CM

## 2016-08-26 DIAGNOSIS — Z125 Encounter for screening for malignant neoplasm of prostate: Secondary | ICD-10-CM

## 2016-08-26 DIAGNOSIS — Z1212 Encounter for screening for malignant neoplasm of rectum: Secondary | ICD-10-CM

## 2016-08-26 DIAGNOSIS — Z0000 Encounter for general adult medical examination without abnormal findings: Secondary | ICD-10-CM

## 2016-08-26 DIAGNOSIS — I10 Essential (primary) hypertension: Secondary | ICD-10-CM

## 2016-08-26 DIAGNOSIS — E559 Vitamin D deficiency, unspecified: Secondary | ICD-10-CM

## 2016-08-26 DIAGNOSIS — R7309 Other abnormal glucose: Secondary | ICD-10-CM

## 2016-08-26 DIAGNOSIS — E349 Endocrine disorder, unspecified: Secondary | ICD-10-CM

## 2016-08-26 DIAGNOSIS — R0989 Other specified symptoms and signs involving the circulatory and respiratory systems: Secondary | ICD-10-CM

## 2016-08-26 DIAGNOSIS — Z111 Encounter for screening for respiratory tuberculosis: Secondary | ICD-10-CM | POA: Diagnosis not present

## 2016-08-26 DIAGNOSIS — K219 Gastro-esophageal reflux disease without esophagitis: Secondary | ICD-10-CM

## 2016-08-26 LAB — LIPID PANEL
Cholesterol: 147 mg/dL (ref 125–200)
HDL: 59 mg/dL (ref >=40)
LDL Cholesterol: 78 mg/dL (ref <130)
TRIGLYCERIDES: 48 mg/dL (ref <150)
Total CHOL/HDL Ratio: 2.5 Ratio (ref <=5.0)
VLDL: 10 mg/dL (ref <30)

## 2016-08-26 LAB — VITAMIN B12: VITAMIN B 12: 635 pg/mL (ref 200–1100)

## 2016-08-26 LAB — CBC WITH DIFFERENTIAL/PLATELET
BASOS PCT: 1 %
Basophils Absolute: 49 cells/uL (ref 0–200)
EOS ABS: 196 {cells}/uL (ref 15–500)
Eosinophils Relative: 4 %
HEMATOCRIT: 43.8 % (ref 38.5–50.0)
HEMOGLOBIN: 14.9 g/dL (ref 13.2–17.1)
LYMPHS ABS: 2009 {cells}/uL (ref 850–3900)
Lymphocytes Relative: 41 %
MCH: 30.6 pg (ref 27.0–33.0)
MCHC: 34 g/dL (ref 32.0–36.0)
MCV: 89.9 fL (ref 80.0–100.0)
MONO ABS: 490 {cells}/uL (ref 200–950)
MPV: 9.9 fL (ref 7.5–12.5)
Monocytes Relative: 10 %
NEUTROS ABS: 2156 {cells}/uL (ref 1500–7800)
Neutrophils Relative %: 44 %
Platelets: 202 10*3/uL (ref 140–400)
RBC: 4.87 MIL/uL (ref 4.20–5.80)
RDW: 13.3 % (ref 11.0–15.0)
WBC: 4.9 10*3/uL (ref 3.8–10.8)

## 2016-08-26 LAB — HEMOGLOBIN A1C
Hgb A1c MFr Bld: 4.9 % (ref <5.7)
MEAN PLASMA GLUCOSE: 94 mg/dL

## 2016-08-26 LAB — BASIC METABOLIC PANEL WITH GFR
BUN: 19 mg/dL (ref 7–25)
CHLORIDE: 105 mmol/L (ref 98–110)
CO2: 20 mmol/L (ref 20–31)
Calcium: 9.2 mg/dL (ref 8.6–10.3)
Creat: 0.87 mg/dL (ref 0.70–1.25)
GLUCOSE: 99 mg/dL (ref 65–99)
Potassium: 4.2 mmol/L (ref 3.5–5.3)
Sodium: 138 mmol/L (ref 135–146)

## 2016-08-26 LAB — HEPATIC FUNCTION PANEL
ALBUMIN: 4.1 g/dL (ref 3.6–5.1)
ALT: 29 U/L (ref 9–46)
AST: 26 U/L (ref 10–35)
Alkaline Phosphatase: 67 U/L (ref 40–115)
Bilirubin, Direct: 0.1 mg/dL (ref <=0.2)
Indirect Bilirubin: 0.5 mg/dL (ref 0.2–1.2)
TOTAL PROTEIN: 6.4 g/dL (ref 6.1–8.1)
Total Bilirubin: 0.6 mg/dL (ref 0.2–1.2)

## 2016-08-26 LAB — IRON AND TIBC
%SAT: 22 % (ref 15–60)
Iron: 80 ug/dL (ref 50–180)
TIBC: 360 ug/dL (ref 250–425)
UIBC: 280 ug/dL (ref 125–400)

## 2016-08-26 LAB — MAGNESIUM: Magnesium: 1.9 mg/dL (ref 1.5–2.5)

## 2016-08-26 LAB — TSH: TSH: 2.07 mIU/L (ref 0.40–4.50)

## 2016-08-26 LAB — PSA: PSA: 1.4 ng/mL (ref <=4.0)

## 2016-08-26 NOTE — Progress Notes (Signed)
[facility]   Unk Pinto, M.D.    Uvaldo Bristle. Silverio Lay, P.A.-C      Starlyn Skeans, P.A.-C  [facility]                [address] [place] [id number] Telephone [phone] Telefax [phone] Annual  Screening/Preventative Visit  & Comprehensive Evaluation & Examination     This very nice 60 y.o. single WM presents for a Screening/Preventative Visit & comprehensive evaluation and management of multiple medical co-morbidities.  Patient has been followed for HTN, Prediabetes, Hyperlipidemia and Vitamin D Deficiency. Patient does have hx/o GERD with very infrequent sx's of dyspepsia .      Patient is followed expectantly for hx/o labile HTN predates since 2003. Patient's BP has been controlled at home.Today's BP is 124/80. Patient denies any cardiac symptoms as chest pain, palpitations, shortness of breath, dizziness or ankle swelling.     Patient's hyperlipidemia is controlled with diet.  Last lipids were at goal Lab Results  Component Value Date   CHOL 167 07/31/2015   HDL 75 07/31/2015   LDLCALC 81 07/31/2015   TRIG 53 07/31/2015   CHOLHDL 2.2 07/31/2015      Patient is followed expectantly for prediabetes and patient denies reactive hypoglycemic symptoms, visual blurring, diabetic polys or paresthesias. Last A1c was at goal:  Lab Results  Component Value Date   HGBA1C 5.3 07/31/2015       Patient doe s have hx/o Low T (180 in 2010 and 190 in 2013). Finally, patient has history of Vitamin D Deficiency of 44 in 2008 on treatment and last vitamin D was at goal:  Lab Results  Component Value Date   VD25OH 74 07/31/2015   Current Outpatient Prescriptions on File Prior to Visit  Medication Sig  . VITAMIN D Take 2,000 Units by mouth 2 (two) times daily.   . TEMOVATE 0.05 % crm APPLY TO AFFECTED AREA(S) TOPICALLY 2 TIMES DAILY.  Marland Kitchen EPINEPHrine 0.3 mg injec Inject 0.3 mg into the muscle  once.  . Multiple Vitamins-Minerals  Take 1 tablet daily.   Clarnce Flock Palmetto  2 tabs daily  . ranitidine 300 MG tablet Take  daily as needed for heartburn.   Allergies  Allergen Reactions  . Wasp Venom Anaphylaxis    Wasp/Hornet/Bee  . Androgel [Testosterone] Other (See Comments)    headaches   Past Medical History:  Diagnosis Date  . GERD (gastroesophageal reflux disease)   . Labile hypertension    pt states that he no longer has a problem with HTN, takes no meds for HTN  . Other testicular hypofunction   . Psoriasis    Health Maintenance  Topic Date Due  . Hepatitis C Screening  September 03, 1956  . HIV Screening  06/07/1971  . INFLUENZA VACCINE  06/01/2016  . ZOSTAVAX  06/06/2016  . COLONOSCOPY  08/25/2016  . TETANUS/TDAP  11/01/2016   Immunization History  Administered Date(s) Administered  . Influenza Split 07/19/2013, 07/29/2014, 07/31/2015  . Influenza,inj,quad, With Preservative 08/26/2016  . PPD Test 07/29/2014, 07/31/2015, 08/26/2016  . Pneumococcal Polysaccharide-23 11/01/2008  . Td 11/01/2006  . Zoster 08/26/2016   Past Surgical History:  Procedure Laterality Date  . COLONOSCOPY    . HEMORRHOID BANDING  04/2011  . HERNIA REPAIR Right 1958  . INSERTION OF MESH N/A 08/30/2014   Procedure: INSERTION OF MESH;  Surgeon: Remo Lipps  Gross, MD;  Location: [facility];  Service: General;  Laterality: N/A;  . LAPAROSCOPIC INGUINAL HERNIA WITH UMBILICAL HERNIA N/A [id number]   Procedure: LAPAROSCOPIC EXPLORATION AND REPAIR OF BILATERAL HERNIAS IN  GROINS AND UMBILICAL HERNIA REPAIR.;  Surgeon: Keric Boston, MD;  Location: [facility];  Service: General;  Laterality: N/A;   Family History  Problem Relation Age of Onset  . Hypertension Mother   . Colon polyps Mother   . Cancer Mother     Uterine  . COPD Father   . Arthritis Father   . Cancer Father     Prostate   Social History  . Marital status: Married S/S   Occupational History  . Retired Doctor, general practice / Chief Operating Officer   Social History  Main Topics  . Smoking status: Former Research scientist (life sciences)  . Smokeless tobacco: Never Used  . Alcohol use Yes     Comment: occasional wine  . Drug use: No  . Sexual activity: Not on file    ROS Constitutional: Denies fever, chills, weight loss/gain, headaches, insomnia,  night sweats or change in appetite. Does c/o fatigue. Eyes: Denies redness, blurred vision, diplopia, discharge, itchy or watery eyes.  ENT: Denies discharge, congestion, post nasal drip, epistaxis, sore throat, earache, hearing loss, dental pain, Tinnitus, Vertigo, Sinus pain or snoring.  Cardio: Denies chest pain, palpitations, irregular heartbeat, syncope, dyspnea, diaphoresis, orthopnea, PND, claudication or edema Respiratory: denies cough, dyspnea, DOE, pleurisy, hoarseness, laryngitis or wheezing.  Gastrointestinal: Denies dysphagia, heartburn, reflux, water brash, pain, cramps, nausea, vomiting, bloating, diarrhea, constipation, hematemesis, melena, hematochezia, jaundice or hemorrhoids Genitourinary: Denies dysuria, frequency, urgency, nocturia, hesitancy, discharge, hematuria or flank pain Musculoskeletal: Denies arthralgia, myalgia, stiffness, Jt. Swelling, pain, limp or strain/sprain. Denies Falls. Skin: Denies puritis, rash, hives, warts, acne, eczema or change in skin lesion Neuro: No weakness, tremor, incoordination, spasms, paresthesia or pain Psychiatric: Denies confusion, memory loss or sensory loss. Denies Depression. Endocrine: Denies change in weight, skin, hair change, nocturia, and paresthesia, diabetic polys, visual blurring or hyper / hypo glycemic episodes.  Heme/Lymph: No excessive bleeding, bruising or enlarged lymph nodes.  Physical Exam  BP 124/80   Pulse 72   Temp 97.5 F (36.4 C)   Resp 16   Ht 6' 2.75" (1.899 m)   Wt 197 lb 6.4 oz (89.5 kg)   BMI 24.84 kg/m   General Appearance: Well nourished, in no apparent distress.  Eyes: PERRLA, EOMs, conjunctiva no swelling or erythema, normal fundi and  vessels. Sinuses: No frontal/maxillary tenderness ENT/Mouth: EACs patent / TMs  nl. Nares clear without erythema, swelling, mucoid exudates. Oral hygiene is good. No erythema, swelling, or exudate. Tongue normal, non-obstructing. Tonsils not swollen or erythematous. Hearing normal.  Neck: Supple, thyroid normal. No bruits, nodes or JVD. Respiratory: Respiratory effort normal.  BS equal and clear bilateral without rales, rhonci, wheezing or stridor. Cardio: Heart sounds are normal with regular rate and rhythm and no murmurs, rubs or gallops. Peripheral pulses are normal and equal bilaterally without edema. No aortic or femoral bruits. Chest: symmetric with normal excursions and percussion.  Abdomen: Soft, with Nl bowel sounds. Nontender, no guarding, rebound, hernias, masses, or organomegaly.  Lymphatics: Non tender without lymphadenopathy.  Genitourinary: No hernias.Testes nl. DRE - prostate nl for age - smooth & firm w/o nodules. Musculoskeletal: Full ROM all peripheral extremities, joint stability, 5/5 strength, and normal gait. Skin: Warm and dry without rashes, lesions, cyanosis, clubbing or  ecchymosis.  Neuro: Cranial nerves intact, reflexes equal bilaterally. Normal muscle tone, no cerebellar symptoms. Sensation  intact.  Pysch: Alert and oriented X 3 with normal affect, insight and judgment appropriate.   Assessment and Plan  1. Annual Preventative/Screening Exam   - Microalbumin / creatinine urine ratio - EKG 12-Lead - [place], RETROPERITNL ABD,  LTD - POC Hemoccult Bld/Stl (3-Cd Home Screen); Future - Urinalysis, Routine w reflex microscopic (not at [facility]) - Vitamin B12 - Iron and TIBC - PSA - Testosterone - CBC with Differential/Platelet - BASIC METABOLIC PANEL WITH GFR - Hepatic function panel - Magnesium - Lipid panel - TSH - Hemoglobin A1c - Insulin, random - VITAMIN D 25 Hydroxy (Vit-D Deficiency, Fractures)  2. Labile hypertension  - Microalbumin / creatinine urine  ratio - EKG 12-Lead - [place], RETROPERITNL ABD,  LTD - TSH  3. Mixed hyperlipidemia  - Lipid panel - TSH  4. Abnormal glucose  - Hemoglobin A1c - Insulin, random  5. Vitamin D deficiency  - VITAMIN D 25 Hydroxy (Vit-D Deficiency, Fractures)  6. Testosterone deficiency  - Testosterone  7. Gastroesophageal reflux disease, esophagitis presence not specified   8. Screening for rectal cancer  - POC Hemoccult Bld/Stl (3-Cd Home Screen); Future  9. Prostate cancer screening  - PSA  10. Need for prophylactic vaccination and inoculation against influenza  - Flu Vaccine QUAD with presevative  11. Screening for ischemic heart disease   12. Screening for AAA (aortic abdominal aneurysm)   13. Other fatigue  - Vitamin B12 - Iron and TIBC - Testosterone - CBC with Differential/Platelet - TSH  14. Medication management  - Urinalysis, Routine w reflex microscopic (not at [facility]) - CBC with Differential/Platelet - BASIC METABOLIC PANEL WITH GFR - Hepatic function panel - Magnesium  15. Screening examination for pulmonary tuberculosis  - PPD  16. Need for prophylactic vaccination and inoculation against varicella  - Varicella-zoster vaccine subcutaneous       Continue prudent diet as discussed, weight control, BP monitoring, regular exercise, and medications as discussed.  Discussed med effects and SE's. Routine screening labs and tests as requested with regular follow-up as recommended. Over 40 minutes of exam, counseling, chart review and high complex critical decision making was performed

## 2016-08-26 NOTE — Patient Instructions (Signed)

## 2016-08-27 LAB — VITAMIN D 25 HYDROXY (VIT D DEFICIENCY, FRACTURES): Vit D, 25-Hydroxy: 69 ng/mL (ref 30–100)

## 2016-08-27 LAB — URINALYSIS, ROUTINE W REFLEX MICROSCOPIC
BILIRUBIN URINE: NEGATIVE
Glucose, UA: NEGATIVE
Hgb urine dipstick: NEGATIVE
KETONES UR: NEGATIVE
Leukocytes, UA: NEGATIVE
NITRITE: NEGATIVE
PROTEIN: NEGATIVE
Specific Gravity, Urine: 1.018 (ref 1.001–1.035)
pH: 7 (ref 5.0–8.0)

## 2016-08-27 LAB — TESTOSTERONE: Testosterone: 181 ng/dL — ABNORMAL LOW (ref 250–827)

## 2016-08-27 LAB — INSULIN, RANDOM: INSULIN: 4.1 u[IU]/mL (ref 2.0–19.6)

## 2016-08-27 LAB — MICROALBUMIN / CREATININE URINE RATIO
CREATININE, URINE: 113 mg/dL (ref 20–370)
MICROALB/CREAT RATIO: 2 ug/mg{creat} (ref <30)
Microalb, Ur: 0.2 mg/dL

## 2016-08-30 ENCOUNTER — Encounter: Payer: Self-pay | Admitting: Internal Medicine

## 2016-08-30 LAB — TB SKIN TEST
INDURATION: 0 mm
TB SKIN TEST: NEGATIVE

## 2016-09-10 ENCOUNTER — Other Ambulatory Visit: Payer: Self-pay | Admitting: *Deleted

## 2016-09-10 DIAGNOSIS — Z0001 Encounter for general adult medical examination with abnormal findings: Secondary | ICD-10-CM

## 2016-09-10 DIAGNOSIS — Z1212 Encounter for screening for malignant neoplasm of rectum: Secondary | ICD-10-CM

## 2016-09-10 LAB — POC HEMOCCULT BLD/STL (HOME/3-CARD/SCREEN)
Card #2 Fecal Occult Blod, POC: NEGATIVE
Card #3 Fecal Occult Blood, POC: NEGATIVE
Fecal Occult Blood, POC: NEGATIVE

## 2016-09-28 ENCOUNTER — Ambulatory Visit (AMBULATORY_SURGERY_CENTER): Payer: Self-pay

## 2016-09-28 VITALS — Ht 74.0 in | Wt 202.6 lb

## 2016-09-28 DIAGNOSIS — Z8371 Family history of colonic polyps: Secondary | ICD-10-CM

## 2016-09-28 NOTE — Progress Notes (Signed)
Per pt, no allergies to soy or egg products.Pt not taking any weight loss meds or using  O2 at home. 

## 2016-10-01 HISTORY — PX: COLONOSCOPY: SHX174

## 2016-10-12 ENCOUNTER — Encounter: Payer: Self-pay | Admitting: Internal Medicine

## 2016-10-12 ENCOUNTER — Ambulatory Visit (AMBULATORY_SURGERY_CENTER): Payer: Managed Care, Other (non HMO) | Admitting: Internal Medicine

## 2016-10-12 VITALS — BP 154/91 | HR 51 | Temp 97.7°F | Resp 13 | Ht 74.0 in | Wt 202.0 lb

## 2016-10-12 DIAGNOSIS — K635 Polyp of colon: Secondary | ICD-10-CM | POA: Diagnosis not present

## 2016-10-12 DIAGNOSIS — Z1211 Encounter for screening for malignant neoplasm of colon: Secondary | ICD-10-CM | POA: Diagnosis not present

## 2016-10-12 DIAGNOSIS — D125 Benign neoplasm of sigmoid colon: Secondary | ICD-10-CM

## 2016-10-12 DIAGNOSIS — Z1212 Encounter for screening for malignant neoplasm of rectum: Secondary | ICD-10-CM

## 2016-10-12 DIAGNOSIS — D128 Benign neoplasm of rectum: Secondary | ICD-10-CM

## 2016-10-12 DIAGNOSIS — D129 Benign neoplasm of anus and anal canal: Secondary | ICD-10-CM

## 2016-10-12 DIAGNOSIS — R112 Nausea with vomiting, unspecified: Secondary | ICD-10-CM

## 2016-10-12 DIAGNOSIS — K621 Rectal polyp: Secondary | ICD-10-CM | POA: Diagnosis not present

## 2016-10-12 MED ORDER — SODIUM CHLORIDE 0.9 % IV SOLN: 500.0000 mL | INTRAVENOUS | Status: DC

## 2016-10-12 MED ORDER — SODIUM CHLORIDE 0.9 % IV SOLN: 4.0000 mg | Freq: Once | INTRAVENOUS | Status: DC

## 2016-10-12 NOTE — Psychosocial Assessment (Signed)
Patient with positive clear emesis 15 in after Zofran given, Dr. Carlean Purl notified and order for additional Zofran 4 mg obtained and given. [facility]

## 2016-10-12 NOTE — Progress Notes (Signed)
Called to room to assist during endoscopic procedure.  Patient ID and intended procedure confirmed with present staff. Received instructions for my participation in the procedure from the performing physician.  

## 2016-10-12 NOTE — Patient Instructions (Addendum)
I found and removed 2 polyps - both look benign. There was some oozing of blood from the larger polyp removal site so I treated that with 2 tiny metal clips and some epinephrine injection.  You also have a condition called diverticulosis - common and not usually a problem. Please read the handout provided.  I will let you know pathology results and when to have another routine colonoscopy by mail.  I appreciate the opportunity to care for you. Gatha Mayer, MD, FACG   YOU HAD AN ENDOSCOPIC PROCEDURE TODAY AT [facility]:   Refer to the procedure report that was given to you for any specific questions about what was found during the examination.  If the procedure report does not answer your questions, please call your gastroenterologist to clarify.  If you requested that your care partner not be given the details of your procedure findings, then the procedure report has been included in a sealed envelope for you to review at your convenience later.  YOU SHOULD EXPECT: Some feelings of bloating in the abdomen. Passage of more gas than usual.  Walking can help get rid of the air that was put into your GI tract during the procedure and reduce the bloating. If you had a lower endoscopy (such as a colonoscopy or flexible sigmoidoscopy) you may notice spotting of blood in your stool or on the toilet paper. If you underwent a bowel prep for your procedure, you may not have a normal bowel movement for a few days.  Please Note:  You might notice some irritation and congestion in your nose or some drainage.  This is from the oxygen used during your procedure.  There is no need for concern and it should clear up in a day or so.  SYMPTOMS TO REPORT IMMEDIATELY:   Following lower endoscopy (colonoscopy or flexible sigmoidoscopy):  Excessive amounts of blood in the stool  Significant tenderness or worsening of abdominal pains  Swelling of the abdomen that is new, acute  Fever of  100F or higher   Following upper endoscopy (EGD)  Vomiting of blood or coffee ground material  New chest pain or pain under the shoulder blades  Painful or persistently difficult swallowing  New shortness of breath  Fever of 100F or higher  Black, tarry-looking stools  For urgent or emergent issues, a gastroenterologist can be reached at any hour by calling [phone].   DIET:  We do recommend a small meal at first, but then you may proceed to your regular diet.  Drink plenty of fluids but you should avoid alcoholic beverages for 24 hours.  ACTIVITY:  You should plan to take it easy for the rest of today and you should NOT DRIVE or use heavy machinery until tomorrow (because of the sedation medicines used during the test).    FOLLOW UP: Our staff will call the number listed on your records the next business day following your procedure to check on you and address any questions or concerns that you may have regarding the information given to you following your procedure. If we do not reach you, we will leave a message.  However, if you are feeling well and you are not experiencing any problems, there is no need to return our call.  We will assume that you have returned to your regular daily activities without incident.  If any biopsies were taken you will be contacted by phone or by letter within the next 1-3 weeks.  Please call us at [phone] if you have not heard about the biopsies in 3 weeks.    SIGNATURES/CONFIDENTIALITY: You and/or your care partner have signed paperwork which will be entered into your electronic medical record.  These signatures attest to the fact that that the information above on your After Visit Summary has been reviewed and is understood.  Full responsibility of the confidentiality of this discharge information lies with you and/or your care-partner.YOU HAD AN ENDOSCOPIC PROCEDURE TODAY AT [facility]:   Refer to the procedure report  that was given to you for any specific questions about what was found during the examination.  If the procedure report does not answer your questions, please call your gastroenterologist to clarify.  If you requested that your care partner not be given the details of your procedure findings, then the procedure report has been included in a sealed envelope for you to review at your convenience later.  YOU SHOULD EXPECT: Some feelings of bloating in the abdomen. Passage of more gas than usual.  Walking can help get rid of the air that was put into your GI tract during the procedure and reduce the bloating. If you had a lower endoscopy (such as a colonoscopy or flexible sigmoidoscopy) you may notice spotting of blood in your stool or on the toilet paper. If you underwent a bowel prep for your procedure, you may not have a normal bowel movement for a few days.  Please Note:  You might notice some irritation and congestion in your nose or some drainage.  This is from the oxygen used during your procedure.  There is no need for concern and it should clear up in a day or so.  SYMPTOMS TO REPORT IMMEDIATELY:   Following lower endoscopy (colonoscopy or flexible sigmoidoscopy):  Excessive amounts of blood in the stool  Significant tenderness or worsening of abdominal pains  Swelling of the abdomen that is new, acute  Fever of 100F or higher  For urgent or emergent issues, a gastroenterologist can be reached at any hour by calling [phone].  No Advil/ibuprofen/NSAIDS for 2 weeks.  Please read all handouts given to you by your recovery nurse.  DIET:  We do recommend a small meal at first, but then you may proceed to your regular diet.  Drink plenty of fluids but you should avoid alcoholic beverages for 24 hours.  ACTIVITY:  You should plan to take it easy for the rest of today and you should NOT DRIVE or use heavy machinery until tomorrow (because of the sedation medicines used during the test).      FOLLOW UP: Our staff will call the number listed on your records the next business day following your procedure to check on you and address any questions or concerns that you may have regarding the information given to you following your procedure. If we do not reach you, we will leave a message.  However, if you are feeling well and you are not experiencing any problems, there is no need to return our call.  We will assume that you have returned to your regular daily activities without incident.  If any biopsies were taken you will be contacted by phone or by letter within the next 1-3 weeks.  Please call us at [phone] if you have not heard about the biopsies in 3 weeks.    SIGNATURES/CONFIDENTIALITY: You and/or your care partner have signed paperwork which will be entered into your electronic medical record.  These signatures attest to the fact that that the information above on your After Visit Summary has been reviewed and is understood.  Full responsibility of the confidentiality of this discharge information lies with you and/or your care-partner.  Thank  You for letting us take care of your healthcare needs today.

## 2016-10-12 NOTE — Progress Notes (Signed)
To recovery VSS report to Production assistant, radio

## 2016-10-12 NOTE — Progress Notes (Signed)
Patient no longer with nausea/vomitting after the second dose of Zofran given. Christopher Lawrence

## 2016-10-12 NOTE — Op Note (Signed)
[place] Patient Name: Christopher Lawrence Procedure Date: 10/12/2016 9:49 AM MRN: [id number] Endoscopist: Gatha Mayer , MD Age: 60 Referring MD:  Date of Birth: 09/29/1956 Gender: Male Account #: [id number] Procedure:                Colonoscopy Indications:              Screening for colorectal malignant neoplasm, Last                            colonoscopy: 2007 Medicines:                Propofol per Anesthesia, Monitored Anesthesia Care Procedure:                Pre-Anesthesia Assessment:                           - Prior to the procedure, a History and Physical                            was performed, and patient medications and                            allergies were reviewed. The patient's tolerance of                            previous anesthesia was also reviewed. The risks                            and benefits of the procedure and the sedation                            options and risks were discussed with the patient.                            All questions were answered, and informed consent                            was obtained. Prior Anticoagulants: The patient has                            taken no previous anticoagulant or antiplatelet                            agents. ASA Grade Assessment: II - A patient with                            mild systemic disease. After reviewing the risks                            and benefits, the patient was deemed in                            satisfactory condition to undergo the procedure.  After obtaining informed consent, the colonoscope                            was passed under direct vision. Throughout the                            procedure, the patient's blood pressure, pulse, and                            oxygen saturations were monitored continuously. The                            Model CF-HQ190L [id number]) scope was introduced                            through the anus and  advanced to the the cecum,                            identified by appendiceal orifice and ileocecal                            valve. The colonoscopy was performed without                            difficulty. The patient tolerated the procedure                            well. The quality of the bowel preparation was                            good. The bowel preparation used was Miralax. The                            ileocecal valve, appendiceal orifice, and rectum                            were photographed. Scope In: 9:54:54 AM Scope Out: 10:20:40 AM Scope Withdrawal Time: 0 hours 22 minutes 41 seconds  Total Procedure Duration: 0 hours 25 minutes 46 seconds  Findings:                 The perianal and digital rectal examinations were                            normal. Pertinent negatives include normal prostate                            (size, shape, and consistency).                           A 15 mm polyp was found in the mid sigmoid colon.                            The polyp was pedunculated. The polyp was removed  with a hot snare. Resection and retrieval were                            complete. Verification of patient identification                            for the specimen was done. Estimated blood loss was                            minimal. To prevent bleeding after the polypectomy,                            two hemostatic clips were successfully placed (MR                            conditional). There was no bleeding at the end of                            the procedure. Area was successfully injected with                            4 mL of a 1:10,000 solution of epinephrine for                            hemostasis of bleeding caused by the procedure.                            Estimated blood loss was minimal.                           A 5 mm polyp was found in the recto-sigmoid colon.                            The polyp was sessile.  The polyp was removed with a                            cold snare. Resection and retrieval were complete.                            Verification of patient identification for the                            specimen was done. Estimated blood loss was minimal.                           Many small and large-mouthed diverticula were found                            in the sigmoid colon. There was narrowing of the                            colon in association with the diverticular opening.  There was no evidence of diverticular bleeding.                           The exam was otherwise without abnormality on                            direct and retroflexion views. Complications:            No immediate complications. Estimated Blood Loss:     Estimated blood loss was minimal. Impression:               - One 15 mm polyp in the mid sigmoid colon, removed                            with a hot snare. Resected and retrieved. Clips (MR                            conditional) were placed. Injected.                           - One 5 mm polyp at the recto-sigmoid colon,                            removed with a cold snare. Resected and retrieved.                           - Severe diverticulosis in the sigmoid colon. There                            was narrowing of the colon in association with the                            diverticular opening. There was no evidence of                            diverticular bleeding.                           - The examination was otherwise normal on direct                            and retroflexion views. Recommendation:           - Patient has a contact number available for                            emergencies. The signs and symptoms of potential                            delayed complications were discussed with the                            patient. Return to normal activities tomorrow.  Written  discharge instructions were provided to the                            patient.                           - Resume previous diet.                           - Continue present medications.                           - No aspirin, ibuprofen, naproxen, or other                            non-steroidal anti-inflammatory drugs for 2 weeks                            after polyp removal.                           - Repeat colonoscopy is recommended for                            surveillance. The colonoscopy date will be                            determined after pathology results from today's                            exam become available for review. Gatha Mayer, MD 10/12/2016 10:29:19 AM This report has been signed electronically.

## 2016-10-12 NOTE — Progress Notes (Signed)
Patient to recovery area and c/o nausea and had clear emesis. Passing gas and "felt better." Patient them got nauseated again notified Dr. Carlean Purl and given order for Zofran 4 mg/IV. Patient had positive emesis and passed gas following emesis. Will cont to monitor. Dr. Carlean Purl examined patient and abdomen soft. Will D/c if patient continues to improve. [facility]

## 2016-10-13 ENCOUNTER — Telehealth: Payer: Self-pay | Admitting: *Deleted

## 2016-10-13 NOTE — Telephone Encounter (Signed)
  Follow up Call-  Call back number 10/12/2016  Post procedure Call Back phone  # [phone]  Permission to leave phone message Yes  Some recent data might be hidden     Patient questions:  Do you have a fever, pain , or abdominal swelling? No. Pain Score  0 *  Have you tolerated food without any problems? Yes.    Have you been able to return to your normal activities? Yes.    Do you have any questions about your discharge instructions: Diet   No. Medications  No. Follow up visit  No.  Do you have questions or concerns about your Care? No.  Actions: * If pain score is 4 or above: No action needed, pain <4.

## 2016-10-27 ENCOUNTER — Encounter: Payer: Self-pay | Admitting: Internal Medicine

## 2016-10-27 DIAGNOSIS — Z86010 Personal history of colonic polyps: Secondary | ICD-10-CM

## 2016-10-27 DIAGNOSIS — Z860101 Personal history of adenomatous and serrated colon polyps: Secondary | ICD-10-CM | POA: Insufficient documentation

## 2016-10-27 HISTORY — DX: Personal history of colonic polyps: Z86.010

## 2016-10-27 HISTORY — DX: Personal history of adenomatous and serrated colon polyps: Z86.0101

## 2016-10-27 NOTE — Progress Notes (Signed)
15 mm TV adenoma + hyperplastic polyp rectum 10/2019 recall colon

## 2017-06-19 ENCOUNTER — Encounter: Payer: Self-pay | Admitting: Internal Medicine

## 2017-06-19 NOTE — Progress Notes (Signed)
[facility]   Unk Pinto, M.D.      Uvaldo Bristle. Silverio Lay, P.A.-C [facility]                [address] [place] [address] Telephone [phone] Telefax [phone] Annual  Screening/Preventative Visit  & Comprehensive Evaluation & Examination     This very nice 61 y.o. single WM presents for a Screening/Preventative Visit & comprehensive evaluation and management of multiple medical co-morbidities.  Patient has been followed expectantly for elevated BP, Prediabetes, Hyperlipidemia and Vitamin D Deficiency. Patient has hx/o GERD controlled w/diet and meds.     Patient has hx/o labile HTN and has been followed expectantly since 2003 . Patient's BP has been controlled at home.  Today's BP is at goal -  114/72. Patient denies any cardiac symptoms as chest pain, palpitations, shortness of breath, dizziness or ankle swelling.     Patient's hyperlipidemia is controlled with diet.  Last lipids were at goal: Lab Results  Component Value Date   CHOL 147 08/26/2016   HDL 59 08/26/2016   LDLCALC 78 08/26/2016   TRIG 48 08/26/2016   CHOLHDL 2.5 08/26/2016      Patient is monitored proactively for prediabetes and patient denies reactive hypoglycemic symptoms, visual blurring, diabetic polys or paresthesias. Last A1c was at goal:   Lab Results  Component Value Date   HGBA1C 4.9 08/26/2016       Patient has hx/o Testosterone Deficiency ("180"/2010 and "190"/2013) monitored expectantly.  Finally, patient has history of Vitamin D Deficiency ("44" on treatment in 2008) and last vitamin D was  Lab Results  Component Value Date   VD25OH 69 08/26/2016   Current Outpatient Prescriptions on File Prior to Visit  Medication Sig  . Cholecalciferol (VITAMIN D PO) Take 2,000 Units by mouth 2 (two) times daily.   . clobetasol ointment (TEMOVATE) 0.05 % APPLY TO AFFECTED AREA(S) TOPICALLY 2 TIMES DAILY. (Patient  taking differently: APPLY TO AFFECTED AREA(S) TOPICALLY 2 TIMES PRN)  . EPINEPHrine 0.3 mg/0.3 mL IJ SOAJ injection Inject 0.3 mg into the muscle. Use prn  . Magnesium 400 MG TABS Take 1 tablet by mouth daily.  . Multiple Vitamins-Minerals (MULTIVITAMIN PO) Take 1 tablet by mouth daily.   Marland Kitchen OVER THE COUNTER MEDICATION Saw Palmetto 2 tabs daily   Allergies  Allergen Reactions  . Wasp Venom Anaphylaxis    Wasp/Hornet/Bee  . Androgel [Testosterone] Other (See Comments)    Headaches   Past Medical History:  Diagnosis Date  . GERD (gastroesophageal reflux disease)   . Hx of adenomatous polyp of colon 10/27/2016  . Labile hypertension    pt states that he no longer has a problem with HTN, takes no meds for HTN  . Other testicular hypofunction   . Psoriasis    Health Maintenance  Topic Date Due  . Hepatitis C Screening  Apr 04, 1956  . HIV Screening  06/07/1971  . TETANUS/TDAP  11/01/2016  . INFLUENZA VACCINE  06/01/2017  . COLONOSCOPY  10/13/2019   Immunization History  Administered Date(s) Administered  . Influenza Split 07/19/2013, 07/29/2014, 07/31/2015  . Influenza,inj,quad, With Preservative 08/26/2016  . PPD Test 07/29/2014, 07/31/2015, 08/26/2016  . Pneumococcal Polysaccharide-23 11/01/2008  . Td 11/01/2006  . Zoster 08/26/2016   Past Surgical History:  Procedure Laterality Date  . COLONOSCOPY  10/2016  . COLONOSCOPY N/A 10/2016  . HEMORRHOID  BANDING  04/2011  . HERNIA REPAIR Right 1958  . INSERTION OF MESH N/A 08/30/2014   Procedure: INSERTION OF MESH;  Surgeon: Lofton Boston, MD;  Location: [facility];  Service: General;  Laterality: N/A;  . LAPAROSCOPIC INGUINAL HERNIA WITH UMBILICAL HERNIA N/A 28/41/3244   Procedure: LAPAROSCOPIC EXPLORATION AND REPAIR OF BILATERAL HERNIAS IN  GROINS AND UMBILICAL HERNIA REPAIR.;  Surgeon: Wallie Boston, MD;  Location: [facility];  Service: General;  Laterality: N/A;   Family History  Problem Relation Age of Onset  . Hypertension Mother   .  Colon polyps Mother   . Cancer Mother        Uterine  . COPD Father   . Arthritis Father   . Cancer Father        Prostate   Social History   Social History  . Marital status: Married    Spouse name: N/A  . Number of children: N/A  . Years of education: N/A   Occupational History  . Retired Chief Operating Officer   Social History Main Topics  . Smoking status: Former Smoker    Quit date: 09/28/1996  . Smokeless tobacco: Never Used  . Alcohol use 8.4 oz/week    14 Glasses of wine per week     Comment: occasional wine  . Drug use: No  . Sexual activity: Not on file    ROS Constitutional: Denies fever, chills, weight loss/gain, headaches, insomnia,  night sweats or change in appetite. Does c/o fatigue. Eyes: Denies redness, blurred vision, diplopia, discharge, itchy or watery eyes.  ENT: Denies discharge, congestion, post nasal drip, epistaxis, sore throat, earache, hearing loss, dental pain, Tinnitus, Vertigo, Sinus pain or snoring.  Cardio: Denies chest pain, palpitations, irregular heartbeat, syncope, dyspnea, diaphoresis, orthopnea, PND, claudication or edema Respiratory: denies cough, dyspnea, DOE, pleurisy, hoarseness, laryngitis or wheezing.  Gastrointestinal: Denies dysphagia, heartburn, reflux, water brash, pain, cramps, nausea, vomiting, bloating, diarrhea, constipation, hematemesis, melena, hematochezia, jaundice or hemorrhoids Genitourinary: Denies dysuria, frequency, urgency, nocturia, hesitancy, discharge, hematuria or flank pain Musculoskeletal: Denies arthralgia, myalgia, stiffness, Jt. Swelling, pain, limp or strain/sprain. Denies Falls. Skin: Denies puritis, rash, hives, warts, acne, eczema or change in skin lesion Neuro: No weakness, tremor, incoordination, spasms, paresthesia or pain Psychiatric: Denies confusion, memory loss or sensory loss. Denies Depression. Endocrine: Denies change in weight, skin, hair change, nocturia, and paresthesia, diabetic polys, visual  blurring or hyper / hypo glycemic episodes.  Heme/Lymph: No excessive bleeding, bruising or enlarged lymph nodes.  Physical Exam  BP 114/72   Pulse 68   Temp (!) 97.5 F (36.4 C)   Resp 18   Ht 6' 1.5" (1.867 m)   Wt 200 lb 9.6 oz (91 kg)   BMI 26.11 kg/m   General Appearance: Well nourished and well groomed and in no apparent distress.  Eyes: PERRLA, EOMs, conjunctiva no swelling or erythema, normal fundi and vessels. Sinuses: No frontal/maxillary tenderness ENT/Mouth: EACs patent / TMs  nl. Nares clear without erythema, swelling, mucoid exudates. Oral hygiene is good. No erythema, swelling, or exudate. Tongue normal, non-obstructing. Tonsils not swollen or erythematous. Hearing normal.  Neck: Supple, thyroid normal. No bruits, nodes or JVD. Respiratory: Respiratory effort normal.  BS equal and clear bilateral without rales, rhonci, wheezing or stridor. Cardio: Heart sounds are normal with regular rate and rhythm and no murmurs, rubs or gallops. Peripheral pulses are normal and equal bilaterally without edema. No aortic or femoral bruits. Chest: symmetric with normal excursions and percussion.  Abdomen: Soft, with Nl bowel sounds.  Nontender, no guarding, rebound, hernias, masses, or organomegaly.  Lymphatics: Non tender without lymphadenopathy.  Genitourinary: No hernias.Testes nl. DRE - prostate nl for age - smooth & firm w/o nodules. Musculoskeletal: Full ROM all peripheral extremities, joint stability, 5/5 strength, and normal gait. Skin: Warm and dry without rashes, lesions, cyanosis, clubbing or  ecchymosis.  Neuro: Cranial nerves intact, reflexes equal bilaterally. Normal muscle tone, no cerebellar symptoms. Sensation intact.  Pysch: Alert and oriented X 3 with normal affect, insight and judgment appropriate.   Assessment and Plan  1. Annual Preventative/Screening Exam   2. Labile hypertension  - EKG 12-Lead - [place], RETROPERITNL ABD,  LTD - Urinalysis, Routine w reflex  microscopic - Microalbumin / creatinine urine ratio - CBC with Differential/Platelet - BASIC METABOLIC PANEL WITH GFR - Magnesium - TSH  3. Hyperlipidemia, mixed  - EKG 12-Lead - [place], RETROPERITNL ABD,  LTD - Hepatic function panel - Lipid panel - TSH  4. Diabetes mellitus screening  - [place], RETROPERITNL ABD,  LTD - Hemoglobin A1c - Insulin, fasting  5. Vitamin D deficiency  - VITAMIN D 25 Hydroxy   6. Gastroesophageal reflux disease   7. Testosterone deficiency  - Testosterone  8. Screening for rectal cancer  - POC Hemoccult Bld/Stl  9. Prostate cancer screening  - PSA  10. Screening for ischemic heart disease  - EKG 12-Lead  11. Screening for AAA (aortic abdominal aneurysm)  - [place], RETROPERITNL ABD,  LTD  12. Screening examination for pulmonary tuberculosis   13. Fatigue, unspecified type  - Vitamin B12 - Iron,Total/Total Iron Binding Cap - Testosterone - CBC with Differential/Platelet - TSH  14. Medication management  - Urinalysis, Routine w reflex microscopic - Microalbumin / creatinine urine ratio - CBC with Differential/Platelet        Patient was counseled in prudent diet, weight control to achieve/maintain BMI less than 25, BP monitoring, regular exercise and medications as discussed.  Discussed med effects and SE's. Routine screening labs and tests as requested with regular follow-up as recommended. Over 40 minutes of exam, counseling, chart review and high complex critical decision making was performed

## 2017-06-19 NOTE — Patient Instructions (Signed)

## 2017-06-20 ENCOUNTER — Ambulatory Visit (INDEPENDENT_AMBULATORY_CARE_PROVIDER_SITE_OTHER): Payer: 59 | Admitting: Internal Medicine

## 2017-06-20 ENCOUNTER — Other Ambulatory Visit: Payer: Self-pay | Admitting: *Deleted

## 2017-06-20 VITALS — BP 114/72 | HR 68 | Temp 97.5°F | Resp 18 | Ht 73.5 in | Wt 200.6 lb

## 2017-06-20 DIAGNOSIS — E559 Vitamin D deficiency, unspecified: Secondary | ICD-10-CM

## 2017-06-20 DIAGNOSIS — Z131 Encounter for screening for diabetes mellitus: Secondary | ICD-10-CM | POA: Diagnosis not present

## 2017-06-20 DIAGNOSIS — Z23 Encounter for immunization: Secondary | ICD-10-CM

## 2017-06-20 DIAGNOSIS — Z0001 Encounter for general adult medical examination with abnormal findings: Secondary | ICD-10-CM

## 2017-06-20 DIAGNOSIS — K219 Gastro-esophageal reflux disease without esophagitis: Secondary | ICD-10-CM | POA: Diagnosis not present

## 2017-06-20 DIAGNOSIS — Z136 Encounter for screening for cardiovascular disorders: Secondary | ICD-10-CM

## 2017-06-20 DIAGNOSIS — Z125 Encounter for screening for malignant neoplasm of prostate: Secondary | ICD-10-CM

## 2017-06-20 DIAGNOSIS — E349 Endocrine disorder, unspecified: Secondary | ICD-10-CM

## 2017-06-20 DIAGNOSIS — Z111 Encounter for screening for respiratory tuberculosis: Secondary | ICD-10-CM | POA: Diagnosis not present

## 2017-06-20 DIAGNOSIS — Z1212 Encounter for screening for malignant neoplasm of rectum: Secondary | ICD-10-CM

## 2017-06-20 DIAGNOSIS — R0989 Other specified symptoms and signs involving the circulatory and respiratory systems: Secondary | ICD-10-CM | POA: Diagnosis not present

## 2017-06-20 DIAGNOSIS — Z79899 Other long term (current) drug therapy: Secondary | ICD-10-CM | POA: Diagnosis not present

## 2017-06-20 DIAGNOSIS — E782 Mixed hyperlipidemia: Secondary | ICD-10-CM

## 2017-06-20 DIAGNOSIS — R5383 Other fatigue: Secondary | ICD-10-CM | POA: Diagnosis not present

## 2017-06-20 DIAGNOSIS — Z1211 Encounter for screening for malignant neoplasm of colon: Secondary | ICD-10-CM

## 2017-06-20 LAB — BASIC METABOLIC PANEL WITH GFR
BUN: 17 mg/dL (ref 7–25)
CALCIUM: 9.3 mg/dL (ref 8.6–10.3)
CHLORIDE: 107 mmol/L (ref 98–110)
CO2: 25 mmol/L (ref 20–32)
Creat: 0.81 mg/dL (ref 0.70–1.25)
GFR, EST AFRICAN AMERICAN: 111 mL/min/{1.73_m2} (ref >=60)
GFR, EST NON AFRICAN AMERICAN: 96 mL/min/{1.73_m2} (ref >=60)
Glucose, Bld: 105 mg/dL — ABNORMAL HIGH (ref 65–99)
POTASSIUM: 4.5 mmol/L (ref 3.5–5.3)
SODIUM: 138 mmol/L (ref 135–146)

## 2017-06-20 LAB — IRON, TOTAL/TOTAL IRON BINDING CAP
%SAT: 26 % (ref 15–60)
Iron: 94 ug/dL (ref 50–180)
TIBC: 358 ug/dL (ref 250–425)

## 2017-06-20 MED ORDER — RANITIDINE HCL 300 MG PO TABS: 300.0000 mg | ORAL_TABLET | Freq: Two times a day (BID) | ORAL | 0 refills | Status: DC

## 2017-06-21 LAB — HEPATIC FUNCTION PANEL
AG RATIO: 1.8 (calc) (ref 1.0–2.5)
ALKALINE PHOSPHATASE (APISO): 67 U/L (ref 40–115)
ALT: 31 U/L (ref 9–46)
AST: 22 U/L (ref 10–35)
Albumin: 4.2 g/dL (ref 3.6–5.1)
Bilirubin, Direct: 0.1 mg/dL (ref 0.0–0.2)
Globulin: 2.3 g/dL (calc) (ref 1.9–3.7)
Indirect Bilirubin: 0.5 mg/dL (calc) (ref 0.2–1.2)
Total Bilirubin: 0.6 mg/dL (ref 0.2–1.2)
Total Protein: 6.5 g/dL (ref 6.1–8.1)

## 2017-06-21 LAB — CBC WITH DIFFERENTIAL/PLATELET
BASOS ABS: 50 {cells}/uL (ref 0–200)
Basophils Relative: 0.9 %
Eosinophils Absolute: 269 cells/uL (ref 15–500)
Eosinophils Relative: 4.8 %
HEMATOCRIT: 44.4 % (ref 38.5–50.0)
HEMOGLOBIN: 15.4 g/dL (ref 13.2–17.1)
LYMPHS ABS: 2106 {cells}/uL (ref 850–3900)
MCH: 30.4 pg (ref 27.0–33.0)
MCHC: 34.7 g/dL (ref 32.0–36.0)
MCV: 87.6 fL (ref 80.0–100.0)
MPV: 10.1 fL (ref 7.5–12.5)
Monocytes Relative: 9.3 %
NEUTROS ABS: 2654 {cells}/uL (ref 1500–7800)
NEUTROS PCT: 47.4 %
Platelets: 217 10*3/uL (ref 140–400)
RBC: 5.07 10*6/uL (ref 4.20–5.80)
RDW: 13.1 % (ref 11.0–15.0)
Total Lymphocyte: 37.6 %
WBC: 5.6 10*3/uL (ref 3.8–10.8)
WBCMIX: 521 {cells}/uL (ref 200–950)

## 2017-06-21 LAB — URINALYSIS, ROUTINE W REFLEX MICROSCOPIC
Bilirubin Urine: NEGATIVE
GLUCOSE, UA: NEGATIVE
HGB URINE DIPSTICK: NEGATIVE
Ketones, ur: NEGATIVE
LEUKOCYTES UA: NEGATIVE
NITRITE: NEGATIVE
PH: 6.5 (ref 5.0–8.0)
Protein, ur: NEGATIVE
Specific Gravity, Urine: 1.02 (ref 1.001–1.03)

## 2017-06-21 LAB — TESTOSTERONE: Testosterone: 309 ng/dL (ref 250–827)

## 2017-06-21 LAB — LIPID PANEL
CHOL/HDL RATIO: 2.5 (calc) (ref <5.0)
CHOLESTEROL: 164 mg/dL (ref <200)
HDL: 66 mg/dL (ref >40)
LDL Cholesterol (Calc): 82 mg/dL (calc)
Non-HDL Cholesterol (Calc): 98 mg/dL (calc) (ref <130)
Triglycerides: 79 mg/dL (ref <150)

## 2017-06-21 LAB — HEMOGLOBIN A1C
EAG (MMOL/L): 5.5 (calc)
HEMOGLOBIN A1C: 5.1 %{Hb} (ref <5.7)
MEAN PLASMA GLUCOSE: 100 (calc)

## 2017-06-21 LAB — URINE CULTURE
MICRO NUMBER:: 80902117
Result:: NO GROWTH
SPECIMEN QUALITY: ADEQUATE

## 2017-06-21 LAB — TSH: TSH: 2.52 mIU/L (ref 0.40–4.50)

## 2017-06-21 LAB — MAGNESIUM: MAGNESIUM: 2 mg/dL (ref 1.5–2.5)

## 2017-06-21 LAB — VITAMIN B12: VITAMIN B 12: 649 pg/mL (ref 200–1100)

## 2017-06-21 LAB — INSULIN, FASTING: INSULIN: 3.8 u[IU]/mL (ref 2.0–19.6)

## 2017-06-21 LAB — MICROALBUMIN / CREATININE URINE RATIO
Creatinine, Urine: 120 mg/dL (ref 20–370)
MICROALB UR: 0.3 mg/dL
MICROALB/CREAT RATIO: 3 ug/mg{creat} (ref <30)

## 2017-06-21 LAB — PSA: PSA: 1.6 ng/mL (ref <=4.0)

## 2017-06-21 LAB — VITAMIN D 25 HYDROXY (VIT D DEFICIENCY, FRACTURES): VIT D 25 HYDROXY: 71 ng/mL (ref 30–100)

## 2017-06-22 ENCOUNTER — Telehealth: Payer: Self-pay | Admitting: *Deleted

## 2017-06-22 LAB — TB SKIN TEST
INDURATION: 0 mm
TB SKIN TEST: NEGATIVE

## 2017-06-22 NOTE — Telephone Encounter (Signed)
Patient is aware of lab results and form for insurance faxed to [facility].

## 2017-06-29 ENCOUNTER — Other Ambulatory Visit: Payer: Self-pay

## 2017-06-29 DIAGNOSIS — Z1212 Encounter for screening for malignant neoplasm of rectum: Secondary | ICD-10-CM

## 2017-06-29 LAB — POC HEMOCCULT BLD/STL (HOME/3-CARD/SCREEN)
Card #3 Fecal Occult Blood, POC: NEGATIVE
FECAL OCCULT BLD: NEGATIVE
FECAL OCCULT BLD: NEGATIVE

## 2017-09-02 ENCOUNTER — Encounter: Payer: Self-pay | Admitting: Internal Medicine

## 2017-09-15 ENCOUNTER — Encounter: Payer: Self-pay | Admitting: Internal Medicine

## 2017-09-30 ENCOUNTER — Ambulatory Visit (INDEPENDENT_AMBULATORY_CARE_PROVIDER_SITE_OTHER): Payer: 59

## 2017-09-30 ENCOUNTER — Encounter: Payer: Self-pay | Admitting: Internal Medicine

## 2017-09-30 DIAGNOSIS — Z23 Encounter for immunization: Secondary | ICD-10-CM

## 2018-07-11 ENCOUNTER — Ambulatory Visit (INDEPENDENT_AMBULATORY_CARE_PROVIDER_SITE_OTHER): Payer: 59 | Admitting: Internal Medicine

## 2018-07-11 ENCOUNTER — Other Ambulatory Visit: Payer: Self-pay | Admitting: *Deleted

## 2018-07-11 ENCOUNTER — Encounter: Payer: Self-pay | Admitting: Internal Medicine

## 2018-07-11 VITALS — BP 136/78 | HR 76 | Temp 97.5°F | Resp 16 | Ht 74.0 in | Wt 202.8 lb

## 2018-07-11 DIAGNOSIS — Z0000 Encounter for general adult medical examination without abnormal findings: Secondary | ICD-10-CM

## 2018-07-11 DIAGNOSIS — E559 Vitamin D deficiency, unspecified: Secondary | ICD-10-CM

## 2018-07-11 DIAGNOSIS — Z136 Encounter for screening for cardiovascular disorders: Secondary | ICD-10-CM | POA: Diagnosis not present

## 2018-07-11 DIAGNOSIS — Z111 Encounter for screening for respiratory tuberculosis: Secondary | ICD-10-CM | POA: Diagnosis not present

## 2018-07-11 DIAGNOSIS — Z131 Encounter for screening for diabetes mellitus: Secondary | ICD-10-CM

## 2018-07-11 DIAGNOSIS — Z79899 Other long term (current) drug therapy: Secondary | ICD-10-CM

## 2018-07-11 DIAGNOSIS — Z0001 Encounter for general adult medical examination with abnormal findings: Secondary | ICD-10-CM

## 2018-07-11 DIAGNOSIS — Z1211 Encounter for screening for malignant neoplasm of colon: Secondary | ICD-10-CM

## 2018-07-11 DIAGNOSIS — E782 Mixed hyperlipidemia: Secondary | ICD-10-CM

## 2018-07-11 DIAGNOSIS — Z87891 Personal history of nicotine dependence: Secondary | ICD-10-CM

## 2018-07-11 DIAGNOSIS — R0989 Other specified symptoms and signs involving the circulatory and respiratory systems: Secondary | ICD-10-CM

## 2018-07-11 DIAGNOSIS — E349 Endocrine disorder, unspecified: Secondary | ICD-10-CM

## 2018-07-11 DIAGNOSIS — K219 Gastro-esophageal reflux disease without esophagitis: Secondary | ICD-10-CM

## 2018-07-11 DIAGNOSIS — Z125 Encounter for screening for malignant neoplasm of prostate: Secondary | ICD-10-CM

## 2018-07-11 DIAGNOSIS — Z1212 Encounter for screening for malignant neoplasm of rectum: Secondary | ICD-10-CM

## 2018-07-11 DIAGNOSIS — R5383 Other fatigue: Secondary | ICD-10-CM

## 2018-07-11 DIAGNOSIS — R7309 Other abnormal glucose: Secondary | ICD-10-CM

## 2018-07-11 DIAGNOSIS — I10 Essential (primary) hypertension: Secondary | ICD-10-CM | POA: Diagnosis not present

## 2018-07-11 DIAGNOSIS — Z8249 Family history of ischemic heart disease and other diseases of the circulatory system: Secondary | ICD-10-CM

## 2018-07-11 MED ORDER — CLOBETASOL PROPIONATE 0.05 % EX OINT: TOPICAL_OINTMENT | CUTANEOUS | 0 refills | Status: DC

## 2018-07-11 NOTE — Progress Notes (Signed)
[facility]   Unk Pinto, M.D.     Uvaldo Bristle. Silverio Lay, P.A.-C Liane Comber, [place]                [address] [place], [place] [address] Telephone [phone] Telefax [phone] Annual  Screening/Preventative Visit  & Comprehensive Evaluation & Examination     This very nice 62 y.o. single WM  presents for a Screening /Preventative Visit & comprehensive evaluation and management of multiple medical co-morbidities.  Patient has been followed expectantly for  Labile  HTN, HLD, Prediabetes and Vitamin D Deficiency. Patient does have hx/o GERD controlled with diet & meds.     Patient is followed expectantly for labile HTN since 2003.  Patient's BP has been controlled at home.  Today's BP is at goal - 136/78. Patient denies any cardiac symptoms as chest pain, palpitations, shortness of breath, dizziness or ankle swelling.     Patient's lipids are controlled with diet. Last lipids were at goal:  Lab Results  Component Value Date   CHOL 164 06/20/2017   HDL 66 06/20/2017   LDLCALC 82 06/20/2017   TRIG 79 06/20/2017   CHOLHDL 2.5 06/20/2017      Patient is monitored expectantly for prediabetes and patient denies reactive hypoglycemic symptoms, visual blurring, diabetic polys or paresthesias. Last A1c was Normal & at goal: Lab Results  Component Value Date   HGBA1C 5.1 06/20/2017        Patient has hx/o Testosterone Deficiency ("180"/2010 & 190"/2013) and is monitored expectantly.     Finally, patient has history of Vitamin D Deficiency ("44"/2008) and last vitamin D was at goal: Lab Results  Component Value Date   VD25OH 71 06/20/2017   Current Outpatient Medications on File Prior to Visit  Medication Sig  . Calcium Carbonate (CALCIUM 600 PO) Take 2 tablets by mouth daily.  . Cholecalciferol (VITAMIN D PO) Take 2,000 Units by mouth 2 (two) times daily.   Marland Kitchen EPINEPHrine 0.3 mg/0.3 mL  IJ SOAJ injection Inject 0.3 mg into the muscle. Use prn  . Magnesium 500 MG TABS Take 1 tablet by mouth daily.   . Multiple Vitamins-Minerals (MULTIVITAMIN PO) Take 1 tablet by mouth daily.   Marland Kitchen OVER THE COUNTER MEDICATION Saw Palmetto 2 tabs daily  . ranitidine (ZANTAC) 300 MG tablet Take 1 tablet (300 mg total) by mouth 2 (two) times daily.  Marland Kitchen zinc gluconate 50 MG tablet Take 50 mg by mouth daily.   No current facility-administered medications on file prior to visit.    Allergies  Allergen Reactions  . Wasp Venom Anaphylaxis    Wasp/Hornet/Bee  . Androgel [Testosterone] Other (See Comments)    Headaches   Past Medical History:  Diagnosis Date  . GERD (gastroesophageal reflux disease)   . Hx of adenomatous polyp of colon 10/27/2016  . Labile hypertension   . Other testicular hypofunction   . Psoriasis    Health Maintenance  Topic Date Due  . INFLUENZA VACCINE  06/01/2018  . COLONOSCOPY  10/13/2019  . TETANUS/TDAP  06/21/2027  . Hepatitis C Screening  Completed  . HIV Screening  Completed   Immunization History  Administered Date(s) Administered  . Influenza Inj Mdck Quad With Preservative 09/30/2017  . Influenza Split 07/19/2013, 07/29/2014, 07/31/2015  . Influenza,inj,quad, With Preservative 08/26/2016  . PPD Test 07/29/2014, 07/31/2015, 08/26/2016, 06/20/2017, 07/11/2018  . Pneumococcal  Polysaccharide-23 11/01/2008  . Td 11/01/2006, 06/20/2017  . Zoster 08/26/2016   Last Colon -  Past Surgical History:  Procedure Laterality Date  . COLONOSCOPY  10/2016  . COLONOSCOPY N/A 10/2016  . HEMORRHOID BANDING  04/2011  . HERNIA REPAIR Right 1958  . INSERTION OF MESH N/A 08/30/2014   Procedure: INSERTION OF MESH;  Surgeon: Billyjoe Boston, MD;  Location: [facility];  Service: General;  Laterality: N/A;  . LAPAROSCOPIC INGUINAL HERNIA WITH UMBILICAL HERNIA N/A [id number]   Procedure: LAPAROSCOPIC EXPLORATION AND REPAIR OF BILATERAL HERNIAS IN  GROINS AND UMBILICAL HERNIA REPAIR.;   Surgeon: Duilio Boston, MD;  Location: [facility];  Service: General;  Laterality: N/A;   Family History  Problem Relation Age of Onset  . Hypertension Mother   . Colon polyps Mother   . Cancer Mother        Uterine  . COPD Father   . Arthritis Father   . Cancer Father        Prostate   Social History   Socioeconomic History  . Marital status: Married    Spouse name: None  Occupational History  . Unemployed  Tobacco Use  . Smoking status: Former Smoker    Last attempt to quit: 09/28/1996    Years since quitting: 21.7  . Smokeless tobacco: Never Used  Substance and Sexual Activity  . Alcohol use: Yes    Alcohol/week: 14.0 standard drinks    Types: 14 Glasses of wine per week    Comment: occasional wine  . Drug use: No  . Sexual activity: Not on file    ROS Constitutional: Denies fever, chills, weight loss/gain, headaches, insomnia,  night sweats or change in appetite. Does c/o fatigue. Eyes: Denies redness, blurred vision, diplopia, discharge, itchy or watery eyes.  ENT: Denies discharge, congestion, post nasal drip, epistaxis, sore throat, earache, hearing loss, dental pain, Tinnitus, Vertigo, Sinus pain or snoring.  Cardio: Denies chest pain, palpitations, irregular heartbeat, syncope, dyspnea, diaphoresis, orthopnea, PND, claudication or edema Respiratory: denies cough, dyspnea, DOE, pleurisy, hoarseness, laryngitis or wheezing.  Gastrointestinal: Denies dysphagia, heartburn, reflux, water brash, pain, cramps, nausea, vomiting, bloating, diarrhea, constipation, hematemesis, melena, hematochezia, jaundice or hemorrhoids Genitourinary: Denies dysuria, frequency, urgency, nocturia, hesitancy, discharge, hematuria or flank pain Musculoskeletal: Denies arthralgia, myalgia, stiffness, Jt. Swelling, pain, limp or strain/sprain. Denies Falls. Skin: Denies puritis, rash, hives, warts, acne, eczema or change in skin lesion Neuro: No weakness, tremor, incoordination, spasms, paresthesia  or pain Psychiatric: Denies confusion, memory loss or sensory loss. Denies Depression. Endocrine: Denies change in weight, skin, hair change, nocturia, and paresthesia, diabetic polys, visual blurring or hyper / hypo glycemic episodes.  Heme/Lymph: No excessive bleeding, bruising or enlarged lymph nodes.  Physical Exam  BP 136/78   Pulse 76   Temp (!) 97.5 F (36.4 C)   Resp 16   Ht 6\' 2"  (1.88 m)   Wt 202 lb 12.8 oz (92 kg)   BMI 26.04 kg/m   General Appearance: Well nourished and well groomed and in no apparent distress.  Eyes: PERRLA, EOMs, conjunctiva no swelling or erythema, normal fundi and vessels. Sinuses: No frontal/maxillary tenderness ENT/Mouth: EACs patent / TMs  nl. Nares clear without erythema, swelling, mucoid exudates. Oral hygiene is good. No erythema, swelling, or exudate. Tongue normal, non-obstructing. Tonsils not swollen or erythematous. Hearing normal.  Neck: Supple, thyroid not palpable. No bruits, nodes or JVD. Respiratory: Respiratory effort normal.  BS equal and clear bilateral without rales, rhonci, wheezing or stridor. Cardio: Heart sounds  are normal with regular rate and rhythm and no murmurs, rubs or gallops. Peripheral pulses are normal and equal bilaterally without edema. No aortic or femoral bruits. Chest: symmetric with normal excursions and percussion.  Abdomen: Soft, with Nl bowel sounds. Nontender, no guarding, rebound, hernias, masses, or organomegaly.  Lymphatics: Non tender without lymphadenopathy.  Genitourinary: No hernias.Testes nl. DRE - prostate nl for age - smooth & firm w/o nodules. Musculoskeletal: Full ROM all peripheral extremities, joint stability, 5/5 strength, and normal gait. Skin: Warm and dry without rashes, lesions, cyanosis, clubbing or  ecchymosis.  Neuro: Cranial nerves intact, reflexes equal bilaterally. Normal muscle tone, no cerebellar symptoms. Sensation intact.  Pysch: Alert and oriented X 3 with normal affect, insight  and judgment appropriate.   Assessment and Plan  1. Annual Preventative/Screening Exam   2. Labile hypertension  - EKG 12-Lead - [place], RETROPERITNL ABD,  LTD - Urinalysis, Routine w reflex microscopic - Microalbumin / creatinine urine ratio - CBC with Differential/Platelet - COMPLETE METABOLIC PANEL WITH GFR - Magnesium - TSH  3. Hyperlipidemia, mixed  - EKG 12-Lead - [place], RETROPERITNL ABD,  LTD - Lipid panel - TSH  4. Diabetes mellitus screening  - Hemoglobin A1c - Insulin, random  5. Vitamin D deficiency  - VITAMIN D 25 Hydroxyl  6. Gastroesophageal reflux disease  - CBC with Differential/Platelet  7. Testosterone deficiency  - Testosterone  8. Screening for colorectal cancer  - POC Hemoccult Bld/Stl  9. Prostate cancer screening  - PSA  10. Screening for ischemic heart disease  - EKG 12-Lead  11. Former smoker  - EKG 12-Lead - [place], RETROPERITNL ABD,  LTD  12. FH: hypertension  - EKG 12-Lead - [place], RETROPERITNL ABD,  LTD  13. Screening for AAA (aortic abdominal aneurysm)  - [place], RETROPERITNL ABD,  LTD  14. Screening examination for pulmonary tuberculosis  - PPD  15. Other fatigue  - Iron,Total/Total Iron Binding Cap - Vitamin B12 - CBC with Differential/Platelet  16. Medication management  - Urinalysis, Routine w reflex microscopic - Microalbumin / creatinine urine ratio - CBC with Differential/Platelet - COMPLETE METABOLIC PANEL WITH GFR - Magnesium - Lipid panel - TSH - Hemoglobin A1c - Insulin, random - VITAMIN D 25 Hydroxy       Patient was counseled in prudent diet, weight control to achieve/maintain BMI less than 25, BP monitoring, regular exercise and medications as discussed.  Discussed med effects and SE's. Routine screening labs and tests as requested with regular follow-up as recommended. Over 40 minutes of exam, counseling, chart review and high complex critical decision making was performed

## 2018-07-11 NOTE — Patient Instructions (Signed)

## 2018-07-12 LAB — URINALYSIS, ROUTINE W REFLEX MICROSCOPIC
BILIRUBIN URINE: NEGATIVE
Glucose, UA: NEGATIVE
Hgb urine dipstick: NEGATIVE
Ketones, ur: NEGATIVE
LEUKOCYTES UA: NEGATIVE
NITRITE: NEGATIVE
PROTEIN: NEGATIVE
SPECIFIC GRAVITY, URINE: 1.016 (ref 1.001–1.03)
pH: 7.5 (ref 5.0–8.0)

## 2018-07-12 LAB — CBC WITH DIFFERENTIAL/PLATELET
BASOS PCT: 0.9 %
Basophils Absolute: 50 cells/uL (ref 0–200)
EOS PCT: 4.2 %
Eosinophils Absolute: 231 cells/uL (ref 15–500)
HEMATOCRIT: 44.2 % (ref 38.5–50.0)
HEMOGLOBIN: 15.2 g/dL (ref 13.2–17.1)
LYMPHS ABS: 2085 {cells}/uL (ref 850–3900)
MCH: 30.2 pg (ref 27.0–33.0)
MCHC: 34.4 g/dL (ref 32.0–36.0)
MCV: 87.7 fL (ref 80.0–100.0)
MPV: 10.3 fL (ref 7.5–12.5)
Monocytes Relative: 10 %
NEUTROS ABS: 2585 {cells}/uL (ref 1500–7800)
NEUTROS PCT: 47 %
Platelets: 186 10*3/uL (ref 140–400)
RBC: 5.04 10*6/uL (ref 4.20–5.80)
RDW: 13.1 % (ref 11.0–15.0)
Total Lymphocyte: 37.9 %
WBC: 5.5 10*3/uL (ref 3.8–10.8)
WBCMIX: 550 {cells}/uL (ref 200–950)

## 2018-07-12 LAB — COMPLETE METABOLIC PANEL WITH GFR
AG Ratio: 2 (calc) (ref 1.0–2.5)
ALBUMIN MSPROF: 4.5 g/dL (ref 3.6–5.1)
ALT: 37 U/L (ref 9–46)
AST: 19 U/L (ref 10–35)
Alkaline phosphatase (APISO): 78 U/L (ref 40–115)
BUN: 21 mg/dL (ref 7–25)
CALCIUM: 9.5 mg/dL (ref 8.6–10.3)
CO2: 26 mmol/L (ref 20–32)
Chloride: 106 mmol/L (ref 98–110)
Creat: 0.79 mg/dL (ref 0.70–1.25)
GFR, EST NON AFRICAN AMERICAN: 96 mL/min/{1.73_m2} (ref >=60)
GFR, Est African American: 112 mL/min/{1.73_m2} (ref >=60)
GLOBULIN: 2.2 g/dL (ref 1.9–3.7)
Glucose, Bld: 109 mg/dL — ABNORMAL HIGH (ref 65–99)
Potassium: 4.3 mmol/L (ref 3.5–5.3)
Sodium: 140 mmol/L (ref 135–146)
Total Bilirubin: 0.6 mg/dL (ref 0.2–1.2)
Total Protein: 6.7 g/dL (ref 6.1–8.1)

## 2018-07-12 LAB — MICROALBUMIN / CREATININE URINE RATIO
CREATININE, URINE: 59 mg/dL (ref 20–320)
MICROALB UR: 0.3 mg/dL
MICROALB/CREAT RATIO: 5 ug/mg{creat} (ref <30)

## 2018-07-12 LAB — HEMOGLOBIN A1C
HEMOGLOBIN A1C: 5.2 %{Hb} (ref <5.7)
MEAN PLASMA GLUCOSE: 103 (calc)
eAG (mmol/L): 5.7 (calc)

## 2018-07-12 LAB — MAGNESIUM: Magnesium: 1.9 mg/dL (ref 1.5–2.5)

## 2018-07-12 LAB — LIPID PANEL
CHOL/HDL RATIO: 2.5 (calc) (ref <5.0)
CHOLESTEROL: 164 mg/dL (ref <200)
HDL: 65 mg/dL (ref >40)
LDL CHOLESTEROL (CALC): 83 mg/dL
Non-HDL Cholesterol (Calc): 99 mg/dL (calc) (ref <130)
TRIGLYCERIDES: 78 mg/dL (ref <150)

## 2018-07-12 LAB — VITAMIN B12: Vitamin B-12: 708 pg/mL (ref 200–1100)

## 2018-07-12 LAB — IRON, TOTAL/TOTAL IRON BINDING CAP
%SAT: 20 % (calc) (ref 20–48)
IRON: 70 ug/dL (ref 50–180)
TIBC: 344 ug/dL (ref 250–425)

## 2018-07-12 LAB — TESTOSTERONE: Testosterone: 354 ng/dL (ref 250–827)

## 2018-07-12 LAB — INSULIN, RANDOM: Insulin: 4.9 u[IU]/mL (ref 2.0–19.6)

## 2018-07-12 LAB — PSA: PSA: 1.8 ng/mL (ref <=4.0)

## 2018-07-12 LAB — TSH: TSH: 2.5 mIU/L (ref 0.40–4.50)

## 2018-07-12 LAB — VITAMIN D 25 HYDROXY (VIT D DEFICIENCY, FRACTURES): Vit D, 25-Hydroxy: 67 ng/mL (ref 30–100)

## 2018-07-13 ENCOUNTER — Encounter: Payer: Self-pay | Admitting: *Deleted

## 2018-07-13 LAB — TB SKIN TEST
INDURATION: 0 mm
TB SKIN TEST: NEGATIVE

## 2018-07-14 ENCOUNTER — Other Ambulatory Visit: Payer: Self-pay | Admitting: Internal Medicine

## 2018-07-14 DIAGNOSIS — N529 Male erectile dysfunction, unspecified: Secondary | ICD-10-CM

## 2018-07-14 MED ORDER — TADALAFIL 20 MG PO TABS: ORAL_TABLET | ORAL | 11 refills | Status: DC

## 2018-07-27 ENCOUNTER — Other Ambulatory Visit: Payer: Self-pay

## 2018-07-27 DIAGNOSIS — Z1211 Encounter for screening for malignant neoplasm of colon: Secondary | ICD-10-CM

## 2018-07-27 DIAGNOSIS — Z1212 Encounter for screening for malignant neoplasm of rectum: Principal | ICD-10-CM

## 2018-07-27 LAB — POC HEMOCCULT BLD/STL (HOME/3-CARD/SCREEN)
Card #2 Fecal Occult Blod, POC: NEGATIVE
Card #3 Fecal Occult Blood, POC: NEGATIVE
FECAL OCCULT BLD: NEGATIVE

## 2018-08-23 ENCOUNTER — Ambulatory Visit (INDEPENDENT_AMBULATORY_CARE_PROVIDER_SITE_OTHER): Payer: 59 | Admitting: *Deleted

## 2018-08-23 VITALS — Temp 97.3°F

## 2018-08-23 DIAGNOSIS — Z23 Encounter for immunization: Secondary | ICD-10-CM | POA: Diagnosis not present

## 2019-01-23 ENCOUNTER — Other Ambulatory Visit: Payer: 59 | Admitting: Adult Health

## 2019-01-23 DIAGNOSIS — J0110 Acute frontal sinusitis, unspecified: Secondary | ICD-10-CM | POA: Diagnosis not present

## 2019-01-23 DIAGNOSIS — J301 Allergic rhinitis due to pollen: Secondary | ICD-10-CM | POA: Diagnosis not present

## 2019-01-23 MED ORDER — AMOXICILLIN-POT CLAVULANATE 875-125 MG PO TABS: 1.0000 | ORAL_TABLET | Freq: Two times a day (BID) | ORAL | 0 refills | Status: AC

## 2019-01-23 MED ORDER — FLUTICASONE PROPIONATE 50 MCG/ACT NA SUSP: 2.0000 | Freq: Every day | NASAL | 2 refills | Status: DC

## 2019-01-23 MED ORDER — PREDNISONE 20 MG PO TABS: ORAL_TABLET | ORAL | 0 refills | Status: DC

## 2019-01-23 MED ORDER — FEXOFENADINE HCL 180 MG PO TABS: 180.0000 mg | ORAL_TABLET | Freq: Every day | ORAL | 2 refills | Status: AC

## 2019-01-23 NOTE — Progress Notes (Signed)
63 y.o. with hx of labile BP, hyperlipidemia, GERD called to report "possible sinusitis" requesting evaluation. He verbally consents to Evisit via phone in light of covid 19 outbreak.   He reports symptoms began 2 weeks ago after he did a lot of yard work; reports Hx of allergies to pollen  He noted nasal/sinus congestion the next day, worse at night, large quantities of yellow thick mucus, He has slowly developed a sinus headache progressive during the day, worst over R frontal sinus/over eyebrow, over the last 4 days. He denies fever/chills, vision changes, cough, sore throat, ear pressure/pain, dyspnea, dizziness, n/v/d.   Has been taking diphenhydramine at night, loratadine but reports not significant improvement with this. Has been using expired advil with decongestant which has been helpful, and excedrin for headache which has improved. He reports exercising also seems to help nasal congestion and sinus pressure. Overall he feels symptoms are progressive.   He denies recent travel, known sick exposures. Has a roommate who has not developed any symptoms.   He denies any medication allergies. Reviewed augmentin, advised to take with food or milk, complete full 10 day course. Call with any concerns.   PLAN URI- Discussed the diagnosis and treatment of sinusitis. Suggested symptomatic OTC remedies. Nasal saline spray for congestion. Augmentin per orders. Nasal steroids and short steroid taper per orders. Follow up as needed.   Between 15-20 minutes of counseling, chart review, and critical decision making was performed

## 2019-04-26 ENCOUNTER — Other Ambulatory Visit: Payer: Self-pay | Admitting: Adult Health

## 2019-04-26 DIAGNOSIS — J301 Allergic rhinitis due to pollen: Secondary | ICD-10-CM

## 2019-07-29 ENCOUNTER — Encounter: Payer: Self-pay | Admitting: Internal Medicine

## 2019-07-29 DIAGNOSIS — R5383 Other fatigue: Secondary | ICD-10-CM | POA: Insufficient documentation

## 2019-07-29 NOTE — Progress Notes (Signed)
Annual  Screening/Preventative Visit  & Comprehensive Evaluation & Examination     This very nice 63 y.o. single WM presents for a Screening /Preventative Visit & comprehensive evaluation and management of multiple medical co-morbidities.  Patient has been followed expectantly for labile HTN, HLD, Prediabetes and Vitamin D Deficiency. Patient also has  hx/o GERD controlled on diet & and infrequent OTC antacids.     Patient has been followed expectantly since 2003 for labile HTN & BP's have been controlled at home.  Today's BP is at goal - 132/84. Patient denies any cardiac symptoms as chest pain, palpitations, shortness of breath, dizziness or ankle swelling.     Patient's hyperlipidemia is controlled with diet and medications. Patient denies myalgias or other medication SE's. Last lipids were at goal: Lab Results  Component Value Date   CHOL 164 07/11/2018   HDL 65 07/11/2018   LDLCALC 83 07/11/2018   TRIG 78 07/11/2018   CHOLHDL 2.5 07/11/2018      Patient is also followed expectantly for glucose intolerance   and patient denies reactive hypoglycemic symptoms, visual blurring, diabetic polys or paresthesias. Last A1c was Normal & at goal: Lab Results  Component Value Date   HGBA1C 5.2 07/11/2018         Patient has hx/o Testosterone Deficiency ("180"/2010 & 190"/2013) and last Testosterone was 354 - Normal in Sept 2019.      Finally, patient has history of Vitamin D Deficiency  ("44"/ 2008)  and last vitamin D was at goal: Lab Results  Component Value Date   VD25OH 67 07/11/2018   Current Outpatient Medications on File Prior to Visit  Medication Sig  . Calcium Carbonate (CALCIUM 600 PO) Take 2 tablets by mouth daily.  . Cholecalciferol (VITAMIN D PO) Take 2,000 Units by mouth 2 (two) times daily.   . clobetasol ointment (TEMOVATE) 0.05 % APPLY TO AFFECTED AREA(S) TOPICALLY 2 TIMES DAILY.  Marland Kitchen EPINEPHrine 0.3 mg/0.3 mL IJ SOAJ injection Inject 0.3 mg into the muscle. Use prn  .  fexofenadine (ALLEGRA) 180 MG tablet Take 1 tablet (180 mg total) by mouth daily.  . fluticasone (FLONASE) 50 MCG/ACT nasal spray SPRAY 2 SPRAYS INTO EACH NOSTRIL EVERY DAY  . Magnesium 500 MG TABS Take 1 tablet by mouth daily.   . Multiple Vitamins-Minerals (MULTIVITAMIN PO) Take 1 tablet by mouth daily.   Marland Kitchen OVER THE COUNTER MEDICATION Saw Palmetto 2 tabs daily  . tadalafil (CIALIS) 20 MG tablet Take 1/2 to 1 tablet every 2 to 3 days as needed for XXXX  . zinc gluconate 50 MG tablet Take 50 mg by mouth daily.   No current facility-administered medications on file prior to visit.    Allergies  Allergen Reactions  . Wasp Venom Anaphylaxis    Wasp/Hornet/Bee  . Androgel [Testosterone] Other (See Comments)    Headaches   Past Medical History:  Diagnosis Date  . GERD (gastroesophageal reflux disease)   . Hx of adenomatous polyp of colon 10/27/2016  . Labile hypertension   . Other testicular hypofunction   . Psoriasis    Health Maintenance  Topic Date Due  . INFLUENZA VACCINE  06/02/2019  . COLONOSCOPY  10/13/2019  . TETANUS/TDAP  06/21/2027  . Hepatitis C Screening  Completed  . HIV Screening  Completed   Immunization History  Administered Date(s) Administered  . Influenza Inj Mdck Quad With Preservative 09/30/2017, 08/23/2018  . Influenza Split 07/19/2013, 07/29/2014, 07/31/2015  . Influenza,inj,quad, With Preservative 08/26/2016  . PPD Test 07/29/2014,  07/31/2015, 08/26/2016, 06/20/2017, 07/11/2018  . Pneumococcal Polysaccharide-23 11/01/2008  . Td 11/01/2006, 06/20/2017  . Zoster 08/26/2016   Last Colon - 10/12/2016 - Dr Carlean Purl - Recc 3 y f/u - due Dec 2020  Past Surgical History:  Procedure Laterality Date  . COLONOSCOPY  10/2016  . COLONOSCOPY N/A 10/2016  . HEMORRHOID BANDING  04/2011  . HERNIA REPAIR Right 1958  . INSERTION OF MESH N/A 08/30/2014   Procedure: INSERTION OF MESH;  Surgeon: Jaspal Boston, MD;  Location: [facility];  Service: General;  Laterality: N/A;   . LAPAROSCOPIC INGUINAL HERNIA WITH UMBILICAL HERNIA N/A [id number]   Procedure: LAPAROSCOPIC EXPLORATION AND REPAIR OF BILATERAL HERNIAS IN  GROINS AND UMBILICAL HERNIA REPAIR.;  Surgeon: Tyson Boston, MD;  Location: [facility];  Service: General;  Laterality: N/A;   Family History  Problem Relation Age of Onset  . Hypertension Mother   . Colon polyps Mother   . Cancer Mother        Uterine  . COPD Father   . Arthritis Father   . Cancer Father        Prostate   Social History   Socioeconomic History  . Marital status: Married    Spouse name: Not on file  . Number of children: Not on file  . Years of education: Not on file  . Highest education level: Not on file  Occupational History  . Not on file  Tobacco Use  . Smoking status: Former Smoker    Quit date: 09/28/1996    Years since quitting: 22.8  . Smokeless tobacco: Never Used  Substance and Sexual Activity  . Alcohol use: Yes    Alcohol/week: 14.0 standard drinks    Types: 14 Glasses of wine per week  . Drug use: No  . Sexual activity: Not on file    ROS Constitutional: Denies fever, chills, weight loss/gain, headaches, insomnia,  night sweats or change in appetite. Does c/o fatigue. Eyes: Denies redness, blurred vision, diplopia, discharge, itchy or watery eyes.  ENT: Denies discharge, congestion, post nasal drip, epistaxis, sore throat, earache, hearing loss, dental pain, Tinnitus, Vertigo, Sinus pain or snoring.  Cardio: Denies chest pain, palpitations, irregular heartbeat, syncope, dyspnea, diaphoresis, orthopnea, PND, claudication or edema Respiratory: denies cough, dyspnea, DOE, pleurisy, hoarseness, laryngitis or wheezing.  Gastrointestinal: Denies dysphagia, heartburn, reflux, water brash, pain, cramps, nausea, vomiting, bloating, diarrhea, constipation, hematemesis, melena, hematochezia, jaundice or hemorrhoids Genitourinary: Denies dysuria, frequency, discharge, hematuria or flank pain. Has urgency, nocturia x 2-3  & occasional hesitancy. Musculoskeletal: Denies arthralgia, myalgia, stiffness, Jt. Swelling, pain, limp or strain/sprain. Denies Falls. Skin: Denies puritis, rash, hives, warts, acne, eczema or change in skin lesion Neuro: No weakness, tremor, incoordination, spasms, paresthesia or pain Psychiatric: Denies confusion, memory loss or sensory loss. Denies Depression. Endocrine: Denies change in weight, skin, hair change, nocturia, and paresthesia, diabetic polys, visual blurring or hyper / hypo glycemic episodes.  Heme/Lymph: No excessive bleeding, bruising or enlarged lymph nodes.  Physical Exam  BP 132/84   Pulse 64   Temp (!) 97 F (36.1 C)   Resp 16   Ht 6\' 2"  (1.88 m)   Wt 194 lb 6.4 oz (88.2 kg)   BMI 24.96 kg/m   General Appearance: Well nourished and well groomed and in no apparent distress.  Eyes: PERRLA, EOMs, conjunctiva no swelling or erythema, normal fundi and vessels. Sinuses: No frontal/maxillary tenderness ENT/Mouth: EACs patent / TMs  nl. Nares clear without erythema, swelling, mucoid exudates. Oral hygiene is good.  No erythema, swelling, or exudate. Tongue normal, non-obstructing. Tonsils not swollen or erythematous. Hearing normal.  Neck: Supple, thyroid not palpable. No bruits, nodes or JVD. Respiratory: Respiratory effort normal.  BS equal and clear bilateral without rales, rhonci, wheezing or stridor. Cardio: Heart sounds are normal with regular rate and rhythm and no murmurs, rubs or gallops. Peripheral pulses are normal and equal bilaterally without edema. No aortic or femoral bruits. Chest: symmetric with normal excursions and percussion.  Abdomen: Soft, with Nl bowel sounds. Nontender, no guarding, rebound, hernias, masses, or organomegaly.  Lymphatics: Non tender without lymphadenopathy.  Musculoskeletal: Full ROM all peripheral extremities, joint stability, 5/5 strength, and normal gait. Skin: Warm and dry without rashes, lesions, cyanosis, clubbing or   ecchymosis.  Neuro: Cranial nerves intact, reflexes equal bilaterally. Normal muscle tone, no cerebellar symptoms. Sensation intact.  Pysch: Alert and oriented X 3 with normal affect, insight and judgment appropriate.   Assessment and Plan  1. Annual Preventative/Screening Exam   1. Encounter for general adult medical examination with abnormal findings   2. Labile hypertension  - EKG 12-Lead - [place], RETROPERITNL ABD,  LTD - Urinalysis, Routine w reflex microscopic - Microalbumin / creatinine urine ratio - CBC with Differential/Platelet - COMPLETE METABOLIC PANEL WITH GFR - Magnesium - TSH  3. Hyperlipidemia, mixed  - EKG 12-Lead - [place], RETROPERITNL ABD,  LTD - Lipid panel  4. Abnormal glucose  - EKG 12-Lead - [place], RETROPERITNL ABD,  LTD - Hemoglobin A1c - Insulin, random  5. Vitamin D deficiency  - VITAMIN D 25 Hydroxy (Vit-D Deficiency, Fractures)  6. Testosterone deficiency  - Testosterone  7. Gastroesophageal reflux disease, esophagitis presence not specified   8. Prostate cancer screening  - PSA  9. BPH with obstruction/lower urinary tract symptoms  - PSA  10. Screening examination for pulmonary tuberculosis  - TB Skin Test  11. Screening for colorectal cancer  12. Screening for ischemic heart disease  - EKG 12-Lead  13. FH: hypertension  - EKG 12-Lead - [place], RETROPERITNL ABD,  LTD  14. Screening for AAA (aortic abdominal aneurysm)  - [place], RETROPERITNL ABD,  LTD  15. Fatigue  - Iron,Total/Total Iron Binding Cap - Vitamin B12 - CBC with Differential/Platelet - TSH  16. Medication management  - Urinalysis, Routine w reflex microscopic - Microalbumin / creatinine urine ratio - Testosterone - CBC with Differential/Platelet - COMPLETE METABOLIC PANEL WITH GFR - Magnesium - Lipid panel - TSH - Hemoglobin A1c - Insulin, random - VITAMIN D 25 Hydroxyl        Patient was counseled in prudent diet, weight control to achieve/maintain  BMI less than 25, BP monitoring, regular exercise and medications as discussed.  Discussed med effects and SE's. Routine screening labs and tests as requested with regular follow-up as recommended. Over 40 minutes of exam, counseling, chart review and high complex critical decision making was performed   Kirtland Bouchard, MD

## 2019-07-29 NOTE — Patient Instructions (Signed)

## 2019-07-30 ENCOUNTER — Ambulatory Visit (INDEPENDENT_AMBULATORY_CARE_PROVIDER_SITE_OTHER): Payer: 59 | Admitting: Internal Medicine

## 2019-07-30 ENCOUNTER — Other Ambulatory Visit: Payer: Self-pay

## 2019-07-30 VITALS — BP 132/84 | HR 64 | Temp 97.0°F | Resp 16 | Ht 74.0 in | Wt 194.4 lb

## 2019-07-30 DIAGNOSIS — Z111 Encounter for screening for respiratory tuberculosis: Secondary | ICD-10-CM | POA: Diagnosis not present

## 2019-07-30 DIAGNOSIS — R5383 Other fatigue: Secondary | ICD-10-CM

## 2019-07-30 DIAGNOSIS — Z8249 Family history of ischemic heart disease and other diseases of the circulatory system: Secondary | ICD-10-CM

## 2019-07-30 DIAGNOSIS — E559 Vitamin D deficiency, unspecified: Secondary | ICD-10-CM

## 2019-07-30 DIAGNOSIS — E349 Endocrine disorder, unspecified: Secondary | ICD-10-CM

## 2019-07-30 DIAGNOSIS — R0989 Other specified symptoms and signs involving the circulatory and respiratory systems: Secondary | ICD-10-CM | POA: Diagnosis not present

## 2019-07-30 DIAGNOSIS — R7309 Other abnormal glucose: Secondary | ICD-10-CM

## 2019-07-30 DIAGNOSIS — E782 Mixed hyperlipidemia: Secondary | ICD-10-CM

## 2019-07-30 DIAGNOSIS — Z0000 Encounter for general adult medical examination without abnormal findings: Secondary | ICD-10-CM

## 2019-07-30 DIAGNOSIS — Z0001 Encounter for general adult medical examination with abnormal findings: Secondary | ICD-10-CM

## 2019-07-30 DIAGNOSIS — N138 Other obstructive and reflux uropathy: Secondary | ICD-10-CM

## 2019-07-30 DIAGNOSIS — Z136 Encounter for screening for cardiovascular disorders: Secondary | ICD-10-CM | POA: Diagnosis not present

## 2019-07-30 DIAGNOSIS — Z125 Encounter for screening for malignant neoplasm of prostate: Secondary | ICD-10-CM

## 2019-07-30 DIAGNOSIS — Z79899 Other long term (current) drug therapy: Secondary | ICD-10-CM

## 2019-07-30 DIAGNOSIS — K219 Gastro-esophageal reflux disease without esophagitis: Secondary | ICD-10-CM

## 2019-07-31 LAB — PSA: PSA: 1.8 ng/mL (ref <=4.0)

## 2019-07-31 LAB — COMPLETE METABOLIC PANEL WITH GFR
AG Ratio: 2.1 (calc) (ref 1.0–2.5)
ALT: 31 U/L (ref 9–46)
AST: 22 U/L (ref 10–35)
Albumin: 4.4 g/dL (ref 3.6–5.1)
Alkaline phosphatase (APISO): 75 U/L (ref 35–144)
BUN: 18 mg/dL (ref 7–25)
CO2: 25 mmol/L (ref 20–32)
Calcium: 8.9 mg/dL (ref 8.6–10.3)
Chloride: 108 mmol/L (ref 98–110)
Creat: 0.77 mg/dL (ref 0.70–1.25)
GFR, Est African American: 112 mL/min/{1.73_m2} (ref >=60)
GFR, Est Non African American: 97 mL/min/{1.73_m2} (ref >=60)
Globulin: 2.1 g/dL (calc) (ref 1.9–3.7)
Glucose, Bld: 114 mg/dL — ABNORMAL HIGH (ref 65–99)
Potassium: 4.3 mmol/L (ref 3.5–5.3)
Sodium: 139 mmol/L (ref 135–146)
Total Bilirubin: 0.6 mg/dL (ref 0.2–1.2)
Total Protein: 6.5 g/dL (ref 6.1–8.1)

## 2019-07-31 LAB — CBC WITH DIFFERENTIAL/PLATELET
Absolute Monocytes: 480 cells/uL (ref 200–950)
Basophils Absolute: 49 cells/uL (ref 0–200)
Basophils Relative: 1 %
Eosinophils Absolute: 211 cells/uL (ref 15–500)
Eosinophils Relative: 4.3 %
HCT: 44 % (ref 38.5–50.0)
Hemoglobin: 15.1 g/dL (ref 13.2–17.1)
Lymphs Abs: 1661 cells/uL (ref 850–3900)
MCH: 30.5 pg (ref 27.0–33.0)
MCHC: 34.3 g/dL (ref 32.0–36.0)
MCV: 88.9 fL (ref 80.0–100.0)
MPV: 10.5 fL (ref 7.5–12.5)
Monocytes Relative: 9.8 %
Neutro Abs: 2499 cells/uL (ref 1500–7800)
Neutrophils Relative %: 51 %
Platelets: 206 10*3/uL (ref 140–400)
RBC: 4.95 10*6/uL (ref 4.20–5.80)
RDW: 13.3 % (ref 11.0–15.0)
Total Lymphocyte: 33.9 %
WBC: 4.9 10*3/uL (ref 3.8–10.8)

## 2019-07-31 LAB — INSULIN, RANDOM: Insulin: 5.5 u[IU]/mL

## 2019-07-31 LAB — TSH: TSH: 2.54 mIU/L (ref 0.40–4.50)

## 2019-07-31 LAB — LIPID PANEL
Cholesterol: 147 mg/dL (ref <200)
HDL: 61 mg/dL (ref >=40)
LDL Cholesterol (Calc): 72 mg/dL (calc)
Non-HDL Cholesterol (Calc): 86 mg/dL (calc) (ref <130)
Total CHOL/HDL Ratio: 2.4 (calc) (ref <5.0)
Triglycerides: 67 mg/dL (ref <150)

## 2019-07-31 LAB — VITAMIN B12: Vitamin B-12: 651 pg/mL (ref 200–1100)

## 2019-07-31 LAB — URINALYSIS, ROUTINE W REFLEX MICROSCOPIC
Bilirubin Urine: NEGATIVE
Glucose, UA: NEGATIVE
Hgb urine dipstick: NEGATIVE
Ketones, ur: NEGATIVE
Leukocytes,Ua: NEGATIVE
Nitrite: NEGATIVE
Protein, ur: NEGATIVE
Specific Gravity, Urine: 1.016 (ref 1.001–1.03)
pH: 6.5 (ref 5.0–8.0)

## 2019-07-31 LAB — MICROALBUMIN / CREATININE URINE RATIO
Creatinine, Urine: 105 mg/dL (ref 20–320)
Microalb Creat Ratio: 2 mcg/mg creat (ref <30)
Microalb, Ur: 0.2 mg/dL

## 2019-07-31 LAB — MAGNESIUM: Magnesium: 1.9 mg/dL (ref 1.5–2.5)

## 2019-07-31 LAB — VITAMIN D 25 HYDROXY (VIT D DEFICIENCY, FRACTURES): Vit D, 25-Hydroxy: 72 ng/mL (ref 30–100)

## 2019-07-31 LAB — IRON, TOTAL/TOTAL IRON BINDING CAP
%SAT: 27 % (calc) (ref 20–48)
Iron: 89 ug/dL (ref 50–180)
TIBC: 324 mcg/dL (calc) (ref 250–425)

## 2019-07-31 LAB — TESTOSTERONE: Testosterone: 189 ng/dL — ABNORMAL LOW (ref 250–827)

## 2019-07-31 LAB — HEMOGLOBIN A1C
Hgb A1c MFr Bld: 5.1 % of total Hgb (ref <5.7)
Mean Plasma Glucose: 100 (calc)
eAG (mmol/L): 5.5 (calc)

## 2019-08-01 LAB — TB SKIN TEST
Induration: 0 mm
TB Skin Test: NEGATIVE

## 2019-09-03 ENCOUNTER — Ambulatory Visit (INDEPENDENT_AMBULATORY_CARE_PROVIDER_SITE_OTHER): Payer: 59

## 2019-09-03 ENCOUNTER — Other Ambulatory Visit: Payer: Self-pay

## 2019-09-03 VITALS — Temp 98.1°F

## 2019-09-03 DIAGNOSIS — Z23 Encounter for immunization: Secondary | ICD-10-CM | POA: Diagnosis not present

## 2019-09-03 NOTE — Progress Notes (Signed)
PATIENT reports for FLU.

## 2019-10-10 ENCOUNTER — Encounter: Payer: Self-pay | Admitting: Internal Medicine

## 2019-10-16 ENCOUNTER — Other Ambulatory Visit: Payer: Self-pay

## 2019-10-16 ENCOUNTER — Ambulatory Visit (AMBULATORY_SURGERY_CENTER): Payer: Managed Care, Other (non HMO) | Admitting: *Deleted

## 2019-10-16 ENCOUNTER — Encounter: Payer: Self-pay | Admitting: Internal Medicine

## 2019-10-16 VITALS — Temp 96.8°F | Ht 74.0 in | Wt 194.8 lb

## 2019-10-16 DIAGNOSIS — Z1159 Encounter for screening for other viral diseases: Secondary | ICD-10-CM

## 2019-10-16 DIAGNOSIS — Z86010 Personal history of colonic polyps: Secondary | ICD-10-CM

## 2019-10-16 NOTE — Progress Notes (Signed)

## 2019-10-24 ENCOUNTER — Other Ambulatory Visit: Payer: Self-pay | Admitting: Internal Medicine

## 2019-10-24 ENCOUNTER — Ambulatory Visit (INDEPENDENT_AMBULATORY_CARE_PROVIDER_SITE_OTHER): Payer: Managed Care, Other (non HMO)

## 2019-10-24 DIAGNOSIS — Z1159 Encounter for screening for other viral diseases: Secondary | ICD-10-CM

## 2019-10-25 LAB — SARS CORONAVIRUS 2 (TAT 6-24 HRS): SARS Coronavirus 2: NEGATIVE

## 2019-10-29 ENCOUNTER — Other Ambulatory Visit: Payer: Self-pay

## 2019-10-29 ENCOUNTER — Ambulatory Visit (AMBULATORY_SURGERY_CENTER): Payer: Managed Care, Other (non HMO) | Admitting: Internal Medicine

## 2019-10-29 ENCOUNTER — Encounter: Payer: Self-pay | Admitting: Internal Medicine

## 2019-10-29 VITALS — BP 146/73 | HR 62 | Temp 98.1°F | Resp 35 | Ht 74.0 in | Wt 194.8 lb

## 2019-10-29 DIAGNOSIS — D127 Benign neoplasm of rectosigmoid junction: Secondary | ICD-10-CM

## 2019-10-29 DIAGNOSIS — K621 Rectal polyp: Secondary | ICD-10-CM

## 2019-10-29 DIAGNOSIS — Z860100 Personal history of colon polyps, unspecified: Secondary | ICD-10-CM

## 2019-10-29 DIAGNOSIS — D128 Benign neoplasm of rectum: Secondary | ICD-10-CM

## 2019-10-29 DIAGNOSIS — D125 Benign neoplasm of sigmoid colon: Secondary | ICD-10-CM

## 2019-10-29 MED ORDER — SODIUM CHLORIDE 0.9 % IV SOLN: 500.0000 mL | Freq: Once | INTRAVENOUS | Status: DC

## 2019-10-29 NOTE — Op Note (Signed)
[place] Patient Name: Christopher Lawrence Procedure Date: 10/29/2019 2:36 PM MRN: [id number] Endoscopist: Gatha Mayer , MD Age: 63 Referring MD:  Date of Birth: 16-Mar-1956 Gender: Male Account #: [id number] Procedure:                Colonoscopy Indications:              Surveillance: Personal history of adenomatous                            polyps on last colonoscopy 3 years ago Medicines:                Propofol per Anesthesia, Monitored Anesthesia Care Procedure:                Pre-Anesthesia Assessment:                           - Prior to the procedure, a History and Physical                            was performed, and patient medications and                            allergies were reviewed. The patient's tolerance of                            previous anesthesia was also reviewed. The risks                            and benefits of the procedure and the sedation                            options and risks were discussed with the patient.                            All questions were answered, and informed consent                            was obtained. Prior Anticoagulants: The patient has                            taken no previous anticoagulant or antiplatelet                            agents. ASA Grade Assessment: II - A patient with                            mild systemic disease. After reviewing the risks                            and benefits, the patient was deemed in                            satisfactory condition to undergo the procedure.  After obtaining informed consent, the colonoscope                            was passed under direct vision. Throughout the                            procedure, the patient's blood pressure, pulse, and                            oxygen saturations were monitored continuously. The                            Colonoscope was introduced through the anus and   advanced to the the cecum, identified by                            appendiceal orifice and ileocecal valve. The                            colonoscopy was performed without difficulty. The                            patient tolerated the procedure well. The quality                            of the bowel preparation was good. The bowel                            preparation used was Miralax via split dose                            instruction. Scope In: 2:46:00 PM Scope Out: 3:07:00 PM Scope Withdrawal Time: 0 hours 17 minutes 21 seconds  Total Procedure Duration: 0 hours 21 minutes 0 seconds  Findings:                 Skin tags were found on perianal exam.                           The digital rectal exam was normal. Pertinent                            negatives include normal prostate (size, shape, and                            consistency).                           Three sessile polyps were found in the rectum and                            sigmoid colon. The polyps were diminutive in size.                            These polyps were removed with a cold snare.  Resection and retrieval were complete. Verification                            of patient identification for the specimen was                            done. Estimated blood loss was minimal.                           Many small and large-mouthed diverticula were found                            in the sigmoid colon. There was narrowing of the                            colon in association with the diverticular opening.                           Anal papilla(e) were hypertrophied.                           The exam was otherwise without abnormality on                            direct and retroflexion views. Complications:            No immediate complications. Estimated Blood Loss:     Estimated blood loss was minimal. Impression:               - Perianal skin tags found on perianal exam.                            - Three diminutive polyps in the rectum and in the                            sigmoid colon, removed with a cold snare. Resected                            and retrieved.                           - Severe diverticulosis in the sigmoid colon. There                            was narrowing of the colon in association with the                            diverticular opening.                           - Anal papilla(e) were hypertrophied.                           - The examination was otherwise normal on direct  and retroflexion views.                           - Personal history of colonic polyps 2-17 15 mm TV                            adenoma. Recommendation:           - Patient has a contact number available for                            emergencies. The signs and symptoms of potential                            delayed complications were discussed with the                            patient. Return to normal activities tomorrow.                            Written discharge instructions were provided to the                            patient.                           - Resume previous diet.                           - Continue present medications.                           - Repeat colonoscopy is recommended for                            surveillance. The colonoscopy date will be                            determined after pathology results from today's                            exam become available for review. Gatha Mayer, MD 10/29/2019 3:21:17 PM This report has been signed electronically.

## 2019-10-29 NOTE — Patient Instructions (Addendum)
I found and removed 3 tiny polyps. All look benign.  I will let you know pathology results and when to have another routine colonoscopy by mail and/or My Chart.  Expect to tell you repeat in 5 years.  You still have diverticulosis - thickened muscle rings and pouches in the colon wall. Please read the handout about this condition.   Gatha Mayer, MD, [facility]  Handouts given on Diverticulosis and polyps.  YOU HAD AN ENDOSCOPIC PROCEDURE TODAY AT [facility]:   Refer to the procedure report that was given to you for any specific questions about what was found during the examination.  If the procedure report does not answer your questions, please call your gastroenterologist to clarify.  If you requested that your care partner not be given the details of your procedure findings, then the procedure report has been included in a sealed envelope for you to review at your convenience later.  YOU SHOULD EXPECT: Some feelings of bloating in the abdomen. Passage of more gas than usual.  Walking can help get rid of the air that was put into your GI tract during the procedure and reduce the bloating. If you had a lower endoscopy (such as a colonoscopy or flexible sigmoidoscopy) you may notice spotting of blood in your stool or on the toilet paper. If you underwent a bowel prep for your procedure, you may not have a normal bowel movement for a few days.  Please Note:  You might notice some irritation and congestion in your nose or some drainage.  This is from the oxygen used during your procedure.  There is no need for concern and it should clear up in a day or so.  SYMPTOMS TO REPORT IMMEDIATELY:   Following lower endoscopy (colonoscopy or flexible sigmoidoscopy):  Excessive amounts of blood in the stool  Significant tenderness or worsening of abdominal pains  Swelling of the abdomen that is new, acute  Fever of 100F or higher  For urgent or emergent issues, a gastroenterologist  can be reached at any hour by calling [phone].   DIET:  We do recommend a small meal at first, but then you may proceed to your regular diet.  Drink plenty of fluids but you should avoid alcoholic beverages for 24 hours.  ACTIVITY:  You should plan to take it easy for the rest of today and you should NOT DRIVE or use heavy machinery until tomorrow (because of the sedation medicines used during the test).    FOLLOW UP: Our staff will call the number listed on your records 48-72 hours following your procedure to check on you and address any questions or concerns that you may have regarding the information given to you following your procedure. If we do not reach you, we will leave a message.  We will attempt to reach you two times.  During this call, we will ask if you have developed any symptoms of COVID 19. If you develop any symptoms (ie: fever, flu-like symptoms, shortness of breath, cough etc.) before then, please call [phone].  If you test positive for Covid 19 in the 2 weeks post procedure, please call and report this information to [place].    If any biopsies were taken you will be contacted by phone or by letter within the next 1-3 weeks.  Please call us at [phone] if you have not heard about the biopsies in 3 weeks.    SIGNATURES/CONFIDENTIALITY: You and/or your care partner have signed paperwork  which will be entered into your electronic medical record.  These signatures attest to the fact that that the information above on your After Visit Summary has been reviewed and is understood.  Full responsibility of the confidentiality of this discharge information lies with you and/or your care-partner.

## 2019-10-29 NOTE — Progress Notes (Signed)
Pt experienced vomiting, small amount without desaturation, Zofran and Robinal given, vss Report given to PACU, vss

## 2019-10-31 ENCOUNTER — Telehealth: Payer: Self-pay | Admitting: *Deleted

## 2019-10-31 NOTE — Telephone Encounter (Signed)
  Follow up Call-  Call back number 10/29/2019  Post procedure Call Back phone  # [phone]  Permission to leave phone message Yes  Some recent data might be hidden     Patient questions:  Do you have a fever, pain , or abdominal swelling? No. Pain Score  0 *  Have you tolerated food without any problems? Yes.    Have you been able to return to your normal activities? Yes.    Do you have any questions about your discharge instructions: Diet   No. Medications  No. Follow up visit  No.  Do you have questions or concerns about your Care? No.  Actions: * If pain score is 4 or above: No action needed, pain <4.  1. Have you developed a fever since your procedure? no  2.   Have you had an respiratory symptoms (SOB or cough) since your procedure? no  3.   Have you tested positive for COVID 19 since your procedure no  4.   Have you had any family members/close contacts diagnosed with the COVID 19 since your procedure?  no   If yes to any of these questions please route to Joylene John, RN and Alphonsa Gin, Therapist, sports.

## 2019-11-01 ENCOUNTER — Encounter: Payer: Self-pay | Admitting: Internal Medicine

## 2019-11-01 DIAGNOSIS — Z86010 Personal history of colonic polyps: Secondary | ICD-10-CM

## 2019-11-19 ENCOUNTER — Encounter: Payer: Self-pay | Admitting: Internal Medicine

## 2020-01-10 ENCOUNTER — Other Ambulatory Visit: Payer: Self-pay

## 2020-01-10 ENCOUNTER — Ambulatory Visit (INDEPENDENT_AMBULATORY_CARE_PROVIDER_SITE_OTHER): Payer: 59 | Admitting: Adult Health

## 2020-01-10 ENCOUNTER — Encounter: Payer: Self-pay | Admitting: Adult Health

## 2020-01-10 VITALS — BP 120/72 | HR 69 | Temp 98.1°F | Wt 199.0 lb

## 2020-01-10 DIAGNOSIS — M79672 Pain in left foot: Secondary | ICD-10-CM

## 2020-01-10 DIAGNOSIS — L84 Corns and callosities: Secondary | ICD-10-CM | POA: Diagnosis not present

## 2020-01-10 NOTE — Progress Notes (Signed)
Assessment and Plan:  [place] was seen today for referral.  Diagnoses and all orders for this visit:  Left foot pain, Corn Suggested trial of topical therapies; topical salycilic acid wart treatment or kerasal ointment suggested and reviewed Information provided on AVS Follow up if no improvement and can refer to podiatry for mechanical therapy  Further disposition pending results of labs. Discussed med's effects and SE's.   Over 15 minutes of exam, counseling, chart review, and critical decision making was performed.   Future Appointments  Date Time Provider [place]  08/25/2020 11:00 AM Unk Pinto, MD [facility] None    ------------------------------------------------------------------------------------------------------------------   HPI Wt 199 lb (90.3 kg)   BMI 25.55 kg/m   64 y.o.male presents for evaluation of left foot pain, questioning whether he needs referral to podiatry.   He reports has noted gradual onset of pain at ball of the left foot for several months, seems to be progressive. He has noted a thickened area of skin to area of pain. Has not tried anything for this. Denies known injury to area.   Past Medical History:  Diagnosis Date  . Allergy   . GERD (gastroesophageal reflux disease)   . Hx of adenomatous polyp of colon 10/27/2016  . Labile hypertension   . Other testicular hypofunction   . Psoriasis      Allergies  Allergen Reactions  . Wasp Venom Anaphylaxis    Wasp/Hornet/Bee  . Androgel [Testosterone] Other (See Comments)    Headaches    Current Outpatient Medications on File Prior to Visit  Medication Sig  . Calcium Carbonate (CALCIUM 600 PO) Take 2 tablets by mouth daily.  . Cholecalciferol (VITAMIN D PO) Take 2,000 Units by mouth 2 (two) times daily.   . clobetasol ointment (TEMOVATE) 0.05 % APPLY TO AFFECTED AREA(S) TOPICALLY 2 TIMES DAILY.  Marland Kitchen EPINEPHrine 0.3 mg/0.3 mL IJ SOAJ injection Inject 0.3 mg into the muscle. Use  prn  . fexofenadine (ALLEGRA) 180 MG tablet Take 1 tablet (180 mg total) by mouth daily.  . fluticasone (FLONASE) 50 MCG/ACT nasal spray SPRAY 2 SPRAYS INTO EACH NOSTRIL EVERY DAY  . Magnesium 500 MG TABS Take 1 tablet by mouth daily.   . Multiple Vitamins-Minerals (MULTIVITAMIN PO) Take 1 tablet by mouth daily.   Marland Kitchen OVER THE COUNTER MEDICATION Saw Palmetto 2 tabs daily  . tadalafil (CIALIS) 20 MG tablet Take 1/2 to 1 tablet every 2 to 3 days as needed for XXXX  . zinc gluconate 50 MG tablet Take 50 mg by mouth daily.   No current facility-administered medications on file prior to visit.    ROS: all negative except above.   Physical Exam:  Wt 199 lb (90.3 kg)   BMI 25.55 kg/m   General Appearance: Well nourished, in no apparent distress. Eyes: conjunctiva no swelling or erythema ENT/Mouth: Mask in place;Hearing normal.  Neck: Supple Respiratory: Respiratory effort normal Cardio: Brisk peripheral pulses without edema.  Lymphatics: Non tender without lymphadenopathy.  Musculoskeletal: Full ROM, 5/5 strength, normal gait. No effusion, erythema. No palpable bony abnormality to left foot.  Skin: Warm, dry; he has thickened/calloused skin to mid ball of left foot at the base of 3rd MTP joint. Deep dark discoloration at center of callus.  Neuro: Normal muscle tone, Sensation intact.  Psych: Awake and oriented X 3, normal affect, Insight and Judgment appropriate.     Izora Ribas, NP 9:22 AM [facility]

## 2020-01-10 NOTE — Patient Instructions (Signed)
Try kerasal or topical salicylic acid wart treatments  Call back early next week if still having pain     Corns and Calluses Corns are small areas of thickened skin that occur on the top, sides, or tip of a toe. They contain a cone-shaped core with a point that can press on a nerve below. This causes pain.  Calluses are areas of thickened skin that can occur anywhere on the body, including the hands, fingers, palms, soles of the feet, and heels. Calluses are usually larger than corns. What are the causes? Corns and calluses are caused by rubbing (friction) or pressure, such as from shoes that are too tight or do not fit properly. What increases the risk? Corns are more likely to develop in people who have misshapen toes (toe deformities), such as hammer toes. Calluses can occur with friction to any area of the skin. They are more likely to develop in people who:  Work with their hands.  Wear shoes that fit poorly, are too tight, or are high-heeled.  Have toe deformities. What are the signs or symptoms? Symptoms of a corn or callus include:  A hard growth on the skin.  Pain or tenderness under the skin.  Redness and swelling.  Increased discomfort while wearing tight-fitting shoes, if your feet are affected. If a corn or callus becomes infected, symptoms may include:  Redness and swelling that gets worse.  Pain.  Fluid, blood, or pus draining from the corn or callus. How is this diagnosed? Corns and calluses may be diagnosed based on your symptoms, your medical history, and a physical exam. How is this treated? Treatment for corns and calluses may include:  Removing the cause of the friction or pressure. This may involve: ? Changing your shoes. ? Wearing shoe inserts (orthotics) or other protective layers in your shoes, such as a corn pad. ? Wearing gloves.  Applying medicine to the skin (topical medicine) to help soften skin in the hardened, thickened  areas.  Removing layers of dead skin with a file to reduce the size of the corn or callus.  Removing the corn or callus with a scalpel or laser.  Taking antibiotic medicines, if your corn or callus is infected.  Having surgery, if a toe deformity is the cause. Follow these instructions at home:   Take over-the-counter and prescription medicines only as told by your health care provider.  If you were prescribed an antibiotic, take it as told by your health care provider. Do not stop taking it even if your condition starts to improve.  Wear shoes that fit well. Avoid wearing high-heeled shoes and shoes that are too tight or too loose.  Wear any padding, protective layers, gloves, or orthotics as told by your health care provider.  Soak your hands or feet and then use a file or pumice stone to soften your corn or callus. Do this as told by your health care provider.  Check your corn or callus every day for symptoms of infection. Contact a health care provider if you:  Notice that your symptoms do not improve with treatment.  Have redness or swelling that gets worse.  Notice that your corn or callus becomes painful.  Have fluid, blood, or pus coming from your corn or callus.  Have new symptoms. Summary  Corns are small areas of thickened skin that occur on the top, sides, or tip of a toe.  Calluses are areas of thickened skin that can occur anywhere on the  body, including the hands, fingers, palms, and soles of the feet. Calluses are usually larger than corns.  Corns and calluses are caused by rubbing (friction) or pressure, such as from shoes that are too tight or do not fit properly.  Treatment may include wearing any padding, protective layers, gloves, or orthotics as told by your health care provider. This information is not intended to replace advice given to you by your health care provider. Make sure you discuss any questions you have with your health care  provider. Document Revised: 02/07/2019 Document Reviewed: 08/31/2017 Elsevier Patient Education  [place].     Salicylic Acid topical plaster What is this medicine? SALICYCLIC ACID (SAL i SIL ik AS id) breaks down layers of thick skin. It treats common and plantar warts, psoriasis, calluses, and corns. This medicine may be used for other purposes; ask your health care provider or pharmacist if you have questions. COMMON BRAND NAME(S): Clear Away, Clear Away One Step, Clear Away Plantar, Compound W, Curad Mediplast, Dr. Felicie Morn Callus Removers, Dr. Felicie Morn Extra Thick Callus Remover, Dr. Felicie Morn One Step Callus Remover, Dr.Scholl's Duragel, MOSCO One Step Corn Remover, Trans-Ver-Sal What should I tell my health care provider before I take this medicine? They need to know if you have any of these conditions:  child with chickenpox, the flu, or other viral infection  kidney disease  liver disease  an unusual or allergic reaction to salicylic acid, other medicines, foods, dyes, or preservatives  pregnant or trying to get pregnant  breast-feeding How should I use this medicine? This medicine is for external use only. Follow the directions on the label. Do not apply to raw or irritated skin. Avoid getting medicine in your eyes, lips, nose, mouth, or other sensitive areas. Use this medicine at regular intervals. Do not use more often than directed. Talk to your pediatrician regarding the use of this medicine in children. Special care may be needed. This medicine is not approved for use in children under 49 years old. Overdosage: If you think you have taken too much of this medicine contact a poison control center or emergency room at once. NOTE: This medicine is only for you. Do not share this medicine with others. What if I miss a dose? If you miss a dose, use it as soon as you can. If it is almost time for your next dose, use only that dose. Do not use double or extra  doses. What may interact with this medicine?  medicines that change urine pH like sodium bicarbonate, ammonium chloride and others  medicines that treat or prevent blood clots like warfarin  methotrexate  pyrazinamide  some medicines for diabetes  some medicines for gout  steroid medicines like prednisone or cortisone This list may not describe all possible interactions. Give your health care provider a list of all the medicines, herbs, non-prescription drugs, or dietary supplements you use. Also tell them if you smoke, drink alcohol, or use illegal drugs. Some items may interact with your medicine. What should I watch for while using this medicine? Tell your doctor or healthcare professional if your symptoms do not start to get better or if they get worse. This medicine can make you more sensitive to the sun. Keep out of the sun. If you cannot avoid being in the sun, wear protective clothing and use sunscreen. Do not use sun lamps or tanning beds/booths. Use of this medicine in children under 12 years or in patients with kidney or liver disease may  increase the risk of serious side effects. These patients should not use this medicine over large areas of skin. If you notice symptoms such as nausea, vomiting, dizziness, loss of hearing, ringing in the ears, unusual weakness or tiredness, fast or labored breathing, diarrhea, or confusion, stop using this medicine and contact your doctor or health care professional. What side effects may I notice from receiving this medicine? Side effects that you should report to your doctor or health care professional as soon as possible:  allergic reactions like skin rash, itching or hives, swelling of the face, lips, or tongue Side effects that usually do not require medical attention (report to your doctor or health care professional if they continue or are bothersome):  skin irritation This list may not describe all possible side effects. Call your  doctor for medical advice about side effects. You may report side effects to FDA at 1-800-FDA-1088. Where should I keep my medicine? Keep out of the reach of children. Store at room temperature between 15 and 30 degrees C (59 and 86 degrees F). Do not freeze. Throw away any unused medicine after the expiration date. NOTE: This sheet is a summary. It may not cover all possible information. If you have questions about this medicine, talk to your doctor, pharmacist, or health care provider.  2020 Elsevier/Gold Standard (2019-07-06 14:17:16)

## 2020-01-15 ENCOUNTER — Telehealth: Payer: Self-pay

## 2020-01-15 NOTE — Telephone Encounter (Signed)
Requesting a referral for Podiatry. Patient states that he tried the [place] and still no changes.

## 2020-01-16 ENCOUNTER — Other Ambulatory Visit: Payer: Self-pay | Admitting: Adult Health

## 2020-01-16 DIAGNOSIS — M79672 Pain in left foot: Secondary | ICD-10-CM

## 2020-01-16 DIAGNOSIS — L84 Corns and callosities: Secondary | ICD-10-CM

## 2020-02-12 ENCOUNTER — Ambulatory Visit: Payer: 59

## 2020-02-12 ENCOUNTER — Ambulatory Visit (INDEPENDENT_AMBULATORY_CARE_PROVIDER_SITE_OTHER): Payer: 59 | Admitting: Podiatry

## 2020-02-12 ENCOUNTER — Encounter: Payer: Self-pay | Admitting: Podiatry

## 2020-02-12 ENCOUNTER — Other Ambulatory Visit: Payer: Self-pay

## 2020-02-12 VITALS — BP 126/71 | HR 67 | Temp 98.0°F

## 2020-02-12 DIAGNOSIS — Q828 Other specified congenital malformations of skin: Secondary | ICD-10-CM

## 2020-02-12 DIAGNOSIS — M778 Other enthesopathies, not elsewhere classified: Secondary | ICD-10-CM

## 2020-02-12 NOTE — Progress Notes (Signed)
Subjective:  Patient ID: Christopher Lawrence, male    DOB: February 19, 1956,  MRN: [id number] HPI Chief Complaint  Patient presents with  . Foot Pain    Plantar forefoot left and lateral 5th MPJ right - small, callused area x 1 year, tried filing down with a stone and Kerasol cream- no help  . New Patient (Initial Visit)    64 y.o. male presents with the above complaint.   ROS: Denies fever chills nausea vomiting muscle aches pains calf pain back pain chest pain shortness of breath.  Past Medical History:  Diagnosis Date  . Allergy   . GERD (gastroesophageal reflux disease)   . Hx of adenomatous polyp of colon 10/27/2016  . Labile hypertension   . Other testicular hypofunction   . Psoriasis    Past Surgical History:  Procedure Laterality Date  . COLONOSCOPY  10/2016  . COLONOSCOPY N/A 10/2016  . HEMORRHOID BANDING  04/2011  . HERNIA REPAIR Right 1958  . INSERTION OF MESH N/A 08/30/2014   Procedure: INSERTION OF MESH;  Surgeon: Edwards Boston, MD;  Location: [facility];  Service: General;  Laterality: N/A;  . LAPAROSCOPIC INGUINAL HERNIA WITH UMBILICAL HERNIA N/A 40/98/1191   Procedure: LAPAROSCOPIC EXPLORATION AND REPAIR OF BILATERAL HERNIAS IN  GROINS AND UMBILICAL HERNIA REPAIR.;  Surgeon: Khayman Boston, MD;  Location: [facility];  Service: General;  Laterality: N/A;  . POLYPECTOMY      Current Outpatient Medications:  .  Calcium Carbonate (CALCIUM 600 PO), Take 2 tablets by mouth daily., Disp: , Rfl:  .  Cholecalciferol (VITAMIN D PO), Take 2,000 Units by mouth 2 (two) times daily. , Disp: , Rfl:  .  clobetasol ointment (TEMOVATE) 0.05 %, APPLY TO AFFECTED AREA(S) TOPICALLY 2 TIMES DAILY., Disp: 60 g, Rfl: 0 .  EPINEPHrine 0.3 mg/0.3 mL IJ SOAJ injection, Inject 0.3 mg into the muscle. Use prn, Disp: , Rfl:  .  fexofenadine (ALLEGRA) 180 MG tablet, Take 1 tablet (180 mg total) by mouth daily., Disp: 30 tablet, Rfl: 2 .  fluticasone (FLONASE) 50 MCG/ACT nasal spray, SPRAY 2 SPRAYS INTO EACH NOSTRIL  EVERY DAY, Disp: 48 g, Rfl: 3 .  Magnesium 500 MG TABS, Take 1 tablet by mouth daily. , Disp: , Rfl:  .  Multiple Vitamins-Minerals (MULTIVITAMIN PO), Take 1 tablet by mouth daily. , Disp: , Rfl:  .  OVER THE COUNTER MEDICATION, Saw Palmetto 2 tabs daily, Disp: , Rfl:  .  tadalafil (CIALIS) 20 MG tablet, Take 1/2 to 1 tablet every 2 to 3 days as needed for XXXX, Disp: 30 tablet, Rfl: 11 .  zinc gluconate 50 MG tablet, Take 50 mg by mouth daily., Disp: , Rfl:   Allergies  Allergen Reactions  . Wasp Venom Anaphylaxis    Wasp/Hornet/Bee  . Androgel [Testosterone] Other (See Comments)    Headaches   Review of Systems Objective:   Vitals:   02/12/20 1005  BP: 126/71  Pulse: 67  Temp: 98 F (36.7 C)    General: Well developed, nourished, in no acute distress, alert and oriented x3   Dermatological: Skin is warm, dry and supple bilateral. Nails x 10 are well maintained; remaining integument appears unremarkable at this time. There are no open sores, no preulcerative lesions, no rash or signs of infection present.  Dermatological evaluation does demonstrate porokeratotic lesion subfifth metatarsal head of the left foot.  Lateral aspect of the fifth metatarsal right foot.  No palpable bursa is noted.  Vascular: Dorsalis Pedis artery and Posterior  Tibial artery pedal pulses are 2/4 bilateral with immedate capillary fill time. Pedal hair growth present. No varicosities and no lower extremity edema present bilateral.   Neruologic: Grossly intact via light touch bilateral. Vibratory intact via tuning fork bilateral. Protective threshold with Semmes Wienstein monofilament intact to all pedal sites bilateral. Patellar and Achilles deep tendon reflexes 2+ bilateral. No Babinski or clonus noted bilateral.   Musculoskeletal: No gross boney pedal deformities bilateral. No pain, crepitus, or limitation noted with foot and ankle range of motion bilateral. Muscular strength 5/5 in all groups tested  bilateral.  Gait: Unassisted, Nonantalgic.    Radiographs:  Radiographs do not demonstrate any type of osseous abnormalities.  Assessment & Plan:   Assessment: Porokeratosis plantar aspect fifth met left lateral aspect fifth met head right  Plan: Discussed appropriate shoe gear stretching exercise ice therapy.  Discussed appropriate straight last shoes versus curved last shoes.  Do nucleated the lesions today placed salicylic acid under occlusion to be left on for 2 to 3 days without getting wet before being washed off.  We will follow-up with him on an as-needed basis.     Bryne Lindon T. [place], [place]

## 2020-03-05 ENCOUNTER — Other Ambulatory Visit: Payer: Self-pay

## 2020-03-05 ENCOUNTER — Ambulatory Visit (INDEPENDENT_AMBULATORY_CARE_PROVIDER_SITE_OTHER): Payer: 59 | Admitting: Physician Assistant

## 2020-03-05 ENCOUNTER — Encounter: Payer: Self-pay | Admitting: Physician Assistant

## 2020-03-05 VITALS — BP 110/66 | HR 66 | Temp 97.7°F | Wt 197.0 lb

## 2020-03-05 DIAGNOSIS — J0101 Acute recurrent maxillary sinusitis: Secondary | ICD-10-CM

## 2020-03-05 DIAGNOSIS — R0989 Other specified symptoms and signs involving the circulatory and respiratory systems: Secondary | ICD-10-CM

## 2020-03-05 MED ORDER — AMOXICILLIN-POT CLAVULANATE 875-125 MG PO TABS: 1.0000 | ORAL_TABLET | Freq: Two times a day (BID) | ORAL | 0 refills | Status: DC

## 2020-03-05 MED ORDER — PREDNISONE 20 MG PO TABS: ORAL_TABLET | ORAL | 0 refills | Status: DC

## 2020-03-05 NOTE — Progress Notes (Signed)
Subjective:    Patient ID: Christopher Lawrence, male    DOB: Apr 05, 1956, 64 y.o.   MRN: [id number]  HPI 64 y.o. WM former smoker Q 1997 (20 pack year history) presents with 2 weeks of allergy/sinus symptoms.  Having sinus drainage, sore throat feels like something is there, cough with mucus until yesterday, no SOB, no CP, no fever, chills, no muscle aches.  He has been using allegra tables and nasal spray with out help.  He got his 2nd pfizer shot 04/19.  No night sweats/weight loss.  Last CXR 2015  Wt Readings from Last 3 Encounters:  03/05/20 197 lb (89.4 kg)  01/10/20 199 lb (90.3 kg)  10/29/19 194 lb 12.8 oz (88.4 kg)    Blood pressure 110/66, pulse 66, temperature 97.7 F (36.5 C), weight 197 lb (89.4 kg), SpO2 97 %.  Medications   Current Outpatient Medications (Cardiovascular):  Marland Kitchen  EPINEPHrine 0.3 mg/0.3 mL IJ SOAJ injection, Inject 0.3 mg into the muscle. Use prn .  tadalafil (CIALIS) 20 MG tablet, Take 1/2 to 1 tablet every 2 to 3 days as needed for XXXX  Current Outpatient Medications (Respiratory):  .  fexofenadine (ALLEGRA) 180 MG tablet, Take 1 tablet (180 mg total) by mouth daily. .  fluticasone (FLONASE) 50 MCG/ACT nasal spray, SPRAY 2 SPRAYS INTO EACH NOSTRIL EVERY DAY    Current Outpatient Medications (Other):  Marland Kitchen  Calcium Carbonate (CALCIUM 600 PO), Take 2 tablets by mouth daily. .  Cholecalciferol (VITAMIN D PO), Take 2,000 Units by mouth 2 (two) times daily.  .  clobetasol ointment (TEMOVATE) 0.05 %, APPLY TO AFFECTED AREA(S) TOPICALLY 2 TIMES DAILY. .  Magnesium 500 MG TABS, Take 1 tablet by mouth daily.  .  Multiple Vitamins-Minerals (MULTIVITAMIN PO), Take 1 tablet by mouth daily.  Marland Kitchen  OVER THE COUNTER MEDICATION, Saw Palmetto 2 tabs daily .  zinc gluconate 50 MG tablet, Take 50 mg by mouth daily.  Problem list He has Gastroesophageal reflux disease; Labile hypertension; Testosterone deficiency; Psoriasis; Vitamin D deficiency; Diabetes mellitus screening;  Hyperlipidemia, mixed; Abnormal glucose; Hx of adenomatous polyp of colon; and Fatigue on their problem list.   Review of Systems  Constitutional: Negative for chills, diaphoresis and fever.  HENT: Positive for congestion, sinus pressure and sore throat. Negative for ear pain, postnasal drip, rhinorrhea, sneezing, trouble swallowing and voice change.   Eyes: Negative.   Respiratory: Positive for cough. Negative for chest tightness, shortness of breath and wheezing.   Cardiovascular: Negative.   Gastrointestinal: Negative.   Genitourinary: Negative.   Musculoskeletal: Negative.  Negative for neck pain.  Neurological: Negative.  Negative for headaches.       Objective:   Physical Exam Constitutional:      Appearance: He is well-developed.  HENT:     Head: Normocephalic and atraumatic.     Right Ear: External ear normal.     Left Ear: External ear normal.  Eyes:     Conjunctiva/sclera: Conjunctivae normal.     Pupils: Pupils are equal, round, and reactive to light.  Cardiovascular:     Rate and Rhythm: Normal rate and regular rhythm.     Heart sounds: Normal heart sounds.  Pulmonary:     Effort: Pulmonary effort is normal.     Breath sounds: Normal breath sounds.  Abdominal:     General: Bowel sounds are normal.     Palpations: Abdomen is soft.  Musculoskeletal:        General: Normal range of motion.  Cervical back: Normal range of motion and neck supple.  Skin:    General: Skin is warm and dry.  Neurological:     Mental Status: He is alert and oriented to person, place, and time.     Cranial Nerves: No cranial nerve deficit.  Psychiatric:        Behavior: Behavior normal.        Assessment & Plan:   Acute recurrent maxillary sinusitis -     predniSONE (DELTASONE) 20 MG tablet; 2 tablets daily for 3 days, 1 tablet daily for 4 days. -     amoxicillin-clavulanate (AUGMENTIN) 875-125 MG tablet; Take 1 tablet by mouth 2 (two) times daily. 7 days Will hold the ABX  and take if he is not getting better 7-10 days.  Continue OTC medications like allergy pill and sinus sprays. Can do saline nasal rinses before sinus sprays.  No evidence on exam of deep space infection in the head or neck, mastoiditis, meningitis, or cavernous sinus emesis.   Labile hypertension -     DG Chest 2 View; Future

## 2020-03-05 NOTE — Patient Instructions (Addendum)
SINUSITIS  Sinusitis is inflammation or infection of your sinuses. It is most often caused by a virus. Acute sinusitis may last up to 12 weeks. Chronic sinusitis lasts longer than 12 weeks. Recurrent sinusitis means you have 4 or more times in 1 year, if this occurs we may suggest sending you to an ENT to look for a cause such as polyps or deviated septum.    Sinusitis can be uncomfortable. People with sinusitis have congestion with yellow/green/gray discharge, sinus pain/pressure, pain around the eyes. Sinus infections almost ALWAYS stem from a viral infection and antibiotics don't work against a virus. Even when bacteria is responsible, the infections usually clear up on their own in a week or so.   PLEASE TRY TO DO OVER THE COUNTER TREATMENT DISCUSSED BELOW  AND PREDNISONE  FOR INFLAMMATION IF YOU CHOSE TO TAKE THIS  IF YOU ARE NOT GETTING BETTER OR GETTING WORSE THEN YOU CAN START ON AN ANTIBIOTIC GIVEN.   Risk of antibiotic use: About 1 in 4 people who take antibiotics have side effects including stomach problems, dizziness, or rashes. Those problems clear up soon after stopping the drugs, but in rare cases antibiotics can cause severe allergic reaction. Over use of antibiotics also encourages the growth of bacteria that can't be controlled easily with drugs. That makes you more vunerable to antibiotic-resistant infections and undermines the benefits of antibiotics for others.   Waste of Money: Antibiotics often aren't very expensive, but any money spent on unnecessary drugs is money down the drain.   When are antibiotics needed? Only when symptoms last longer than a week.  Start to improve but then worsen again  -It can take up to 2 weeks to feel better.   -If you do not get better in 7-10 days (Have fever, facial pain, dental pain and swelling), then please call the office and it is now appropriate to start an antibiotic.   MEDICATIONS TO USE FOR SINUSITIS  Acetaminophen  decreases pain and fever. It is available without a doctor's order. Ask how much to take and how often to take it. Follow directions. Read the labels of all other medicines you are using to see if they also contain acetaminophen, or ask your doctor or pharmacist. Acetaminophen can cause liver damage if not taken correctly. Do not use more than 4 grams (4,000 milligrams) total of acetaminophen in one day. -Acetaminiphen 325mg  orally every 4-6 hours for pain.  Max: 10 per day  -Ibuprofen 200mg  orally every 6-8 hours for pain.  Take with food to avoid ulcers.   Max 10 per day  NSAIDs , such as ibuprofen, help decrease swelling, pain, and fever. This medicine is available with or without a doctor's order. NSAIDs can cause stomach bleeding or kidney problems in certain people. If you take blood thinner medicine, always ask your healthcare provider if NSAIDs are safe for you. Always read the medicine label and follow directions.   Antihistamines help dry mucus in the nose and relieve sneezing.  Claritin or loratadine cheapest but likely the weakest  Zyrtec or certizine at night because it can make you sleepy The strongest is allegra or fexafinadine  Cheapest at [facility], [employer], [employer]   Decongestants help reduce swelling and drain mucus in the nose and sinuses. They may help you breathe easier. Avoid nasal spray decongestants over the counter, these can cause you to become addicted to them.   Flonase/Nasonex is to help the inflammation and decreases pressure symptoms.    Take 2  sprays in each nostril at bedtime.  Make sure you spray towards the outside of each nostril towards the outer corner of your eye, hold nose close and tilt head back.  This will help the medication get into your sinuses.  If you do not like this medication, then use saline nasal sprays same directions as above for Flonase. Stop the medication right away if you get blurring of your vision or nose bleeds.  -While drinking fluids,  pinch and hold nose close and swallow.  This will help open up your eustachian tubes to drain the fluid behind your ear drums. -Try steam showers to open your nasal passages.   Drink lots of water to stay hydrated and to thin mucous.   Go to the emergency department if:   Your eye and eyelid are red, swollen, and painful.    You cannot open your eye.    You have vision changes, such as double vision.   Your eyeball bulges out or you cannot move your eye.    You are more sleepy than normal, or you notice changes in your ability to think, move, or talk.   You have a stiff neck, a fever, or a bad headache.    You have swelling of your forehead or scalp.  Contact us if:    Your symptoms do not go away after 10 days.   You have nausea and are vomiting.   Your nose is bleeding.   You have questions or concerns about your condition or care.     Take your medicine as directed. Contact your healthcare provider if you think your medicine is not helping or if you have side effects. Tell him or her if you are allergic to any medicine. Keep a list of the medicines, vitamins, and herbs you take. Include the amounts, and when and why you take them. Bring the list or the pill bottles to follow-up visits. Carry your medicine list with you in case of an emergency.  Self-care:   Rinse your sinuses. Use a sinus rinse device to rinse your nasal passages with a saline (salt water) solution or distilled water. Do not use tap water. This will help thin the mucus in your nose and rinse away pollen and dirt. It will also help reduce swelling so you can breathe normally. Ask your healthcare provider how often to do this.    Breathe in steam. Heat a bowl of water until you see steam. Lean over the bowl and make a tent over your head with a large towel. Breathe deeply for about 20 minutes. Be careful not to get too close to the steam or burn yourself. Do this 3 times a day. You can also breathe  deeply when you take a hot shower.    Sleep with your head elevated. Place an extra pillow under your head before you go to sleep to help your sinuses drain.    Drink liquids as directed. Ask your healthcare provider how much liquid to drink each day and which liquids are best for you. Liquids will thin the mucus in your nose and help it drain. Avoid drinks that contain alcohol or caffeine.    Do not smoke, and avoid secondhand smoke. Nicotine and other chemicals in cigarettes and cigars can make your symptoms worse. Ask your healthcare provider for information if you currently smoke and need help to quit. E-cigarettes or smokeless tobacco still contain nicotine. Talk to your healthcare provider before you use these products.  Prevent the spread of germs that cause sinusitis: Wash your hands often with soap and water. Wash your hands after you use the bathroom, change a child's diaper, or sneeze. Wash your hands before you prepare or eat food.   Follow up with your healthcare provider as directed: You may be referred to an ear, nose, and throat specialist. Write down your questions so you remember to ask them during your visits.   INFORMATION ABOUT YOUR XRAY  Can walk into [address] building for an Insurance account manager. They will have the order and take you back. You do not any paper work, I should get the result back today or tomorrow. This order is good for a year.  Can call [phone] to schedule an appointment if you wish.

## 2020-08-22 ENCOUNTER — Encounter: Payer: 59 | Admitting: Internal Medicine

## 2020-08-25 ENCOUNTER — Encounter: Payer: 59 | Admitting: Internal Medicine

## 2020-08-26 ENCOUNTER — Encounter: Payer: Self-pay | Admitting: Internal Medicine

## 2020-08-26 NOTE — Progress Notes (Signed)
Annual  Screening/Preventative Visit  & Comprehensive Evaluation & Examination      This very nice 64 y.o. MWM presents for a Screening /Preventative Visit & comprehensive evaluation and management of multiple medical co-morbidities.  Patient has been followed expectantly for labile HTN, HLD, Prediabetes and Vitamin D Deficiency. Patient has hx/o GERD controlled with diet.      Patient has been followed since 2003 for labile HTN and patient's BP has been controlled.  Today's BP is at goal - 128/80. Patient denies any cardiac symptoms as chest pain, palpitations, shortness of breath, dizziness or ankle swelling.      Patient's lipids are controlled with diet. Last lipids were at goal:  Lab Results  Component Value Date   CHOL 147 07/30/2019   HDL 61 07/30/2019   LDLCALC 72 07/30/2019   TRIG 67 07/30/2019   CHOLHDL 2.4 07/30/2019      Patient has hx/o Testosterone Deficiency("180"/2010 &190"/2013)and last Testosterone was low at 189  in Sept 2020.       Patient is monitored for glucose intolerance and patient denies reactive hypoglycemic symptoms, visual blurring, diabetic polys or paresthesias. Last A1c was Normal & at goal:  Lab Results  Component Value Date   HGBA1C 5.1 07/30/2019        Finally, patient has history of Vitamin D Deficiency("44"/2008) and last vitamin D was at goal:  Lab Results  Component Value Date   VD25OH 72 07/30/2019    Current Outpatient Medications on File Prior to Visit  Medication Sig  . CALCIUM 600 PO Take 2 tablets  daily.  Marland Kitchen VITAMIN D  Take 2,000 Units  2 x daily.   . TEMOVATE 0.05 % ointment  APPLY TO AFFECTED AREA TOPICALLY 2 TIMES DAILY.  Marland Kitchen EPINEPHrine 0.3 mg injec Inject 0.3 mg into the muscle. Use prn  . ALLEGRA 180 MG tablet Take 1 tablet daily.  Marland Kitchen FLONASE nasal spray SPRAY 2 SPRAYS INTO EACH NOSTRIL EVERY DAY  . Magnesium 500 MG TABS Take 1 tablet by mouth daily.   . Multiple Vitamins-Minerals  Take 1 tablet by mouth daily.     Clarnce Flock Palmetto  2 tabs daily  . tadalafil  20 MG tablet Take 1/2-1 tablet every 2 -3 days as needed  . zinc  50 MG tablet Take 50 mg by mouth daily.    Allergies  Allergen Reactions  . Wasp Venom Anaphylaxis    Wasp/Hornet/Bee  . Androgel [Testosterone] Other (See Comments)    Headaches   Past Medical History:  Diagnosis Date  . Allergy   . GERD (gastroesophageal reflux disease)   . Hx of adenomatous polyp of colon 10/27/2016  . Labile hypertension   . Other testicular hypofunction   . Psoriasis    Health Maintenance  Topic Date Due  . INFLUENZA VACCINE  06/01/2020  . COLONOSCOPY  10/28/2024  . TETANUS/TDAP  06/21/2027  . COVID-19 Vaccine  Completed  . Hepatitis C Screening  Completed  . HIV Screening  Completed   Immunization History  Administered Date(s) Administered  . Influenza Inj Mdck Quad With Preservative 09/30/2017, 08/23/2018, 09/03/2019  . Influenza Split 07/19/2013, 07/29/2014, 07/31/2015  . Influenza,inj,quad, With Preservative 08/26/2016  . PFIZER SARS-COV-2 Vaccination 01/22/2020, 02/18/2020  . PPD Test 07/29/2014, 07/31/2015, 08/26/2016, 06/20/2017, 07/11/2018, 07/30/2019  . Pneumococcal Polysaccharide-23 11/01/2008  . Td 11/01/2006, 06/20/2017  . Zoster 08/26/2016   Last Colon - 10/29/2019 - Dr Carlean Purl - Recc 5 y f/u - due Dec 2025  Past Surgical History:  Procedure Laterality Date  . COLONOSCOPY  10/2016  . COLONOSCOPY N/A 10/2016  . HEMORRHOID BANDING  04/2011  . HERNIA REPAIR Right 1958  . INSERTION OF MESH N/A 08/30/2014   Procedure: INSERTION OF MESH;  Surgeon: Parv Boston, MD;  Location: [facility];  Service: General;  Laterality: N/A;  . LAPAROSCOPIC INGUINAL HERNIA WITH UMBILICAL HERNIA N/A 31/51/7616   Procedure: LAPAROSCOPIC EXPLORATION AND REPAIR OF BILATERAL HERNIAS IN  GROINS AND UMBILICAL HERNIA REPAIR.;  Surgeon: Markeise Boston, MD;  Location: [facility];  Service: General;  Laterality: N/A;  . POLYPECTOMY     Family History  Problem  Relation Age of Onset  . Hypertension Mother   . Colon polyps Mother   . Cancer Mother        Uterine  . Uterine cancer Mother   . COPD Father   . Arthritis Father   . Cancer Father        Prostate  . Prostate cancer Father   . Colon cancer Neg Hx   . Esophageal cancer Neg Hx   . Rectal cancer Neg Hx   . Stomach cancer Neg Hx    Social History   Socioeconomic History  . Marital status: Married    Spouse name: Antony Haste   . Number of children: None  Occupational History  . Not on file  Tobacco Use  . Smoking status: Former Smoker    Quit date: 09/28/1996    Years since quitting: 23.9  . Smokeless tobacco: Never Used  Substance and Sexual Activity  . Alcohol use: Yes    Alcohol/week: 14.0 standard drinks    Types: 14 Glasses of wine per week    Comment: daily  wine  . Drug use: No  . Sexual activity: Not on file    ROS Constitutional: Denies fever, chills, weight loss/gain, headaches, insomnia,  night sweats or change in appetite. Does c/o fatigue. Eyes: Denies redness, blurred vision, diplopia, discharge, itchy or watery eyes.  ENT: Denies discharge, congestion, post nasal drip, epistaxis, sore throat, earache, hearing loss, dental pain, Tinnitus, Vertigo, Sinus pain or snoring.  Cardio: Denies chest pain, palpitations, irregular heartbeat, syncope, dyspnea, diaphoresis, orthopnea, PND, claudication or edema Respiratory: denies cough, dyspnea, DOE, pleurisy, hoarseness, laryngitis or wheezing.  Gastrointestinal: Denies dysphagia, heartburn, reflux, water brash, pain, cramps, nausea, vomiting, bloating, diarrhea, constipation, hematemesis, melena, hematochezia, jaundice or hemorrhoids Genitourinary: Denies dysuria, frequency, urgency, nocturia, hesitancy, discharge, hematuria or flank pain Musculoskeletal: Denies arthralgia, myalgia, stiffness, Jt. Swelling, pain, limp or strain/sprain. Denies Falls. Skin: Denies puritis, rash, hives, warts, acne, eczema or change in skin  lesion Neuro: No weakness, tremor, incoordination, spasms, paresthesia or pain Psychiatric: Denies confusion, memory loss or sensory loss. Denies Depression. Endocrine: Denies change in weight, skin, hair change, nocturia, and paresthesia, diabetic polys, visual blurring or hyper / hypo glycemic episodes.  Heme/Lymph: No excessive bleeding, bruising or enlarged lymph nodes.  Physical Exam  BP 128/80   Pulse 67   Temp (!) 97.4 F (36.3 C)   Resp 16   Ht 6\' 2"  (1.88 m)   Wt 193 lb (87.5 kg)   SpO2 96%   BMI 24.78 kg/m   General Appearance: Well nourished and well groomed and in no apparent distress.  Eyes: PERRLA, EOMs, conjunctiva no swelling or erythema, normal fundi and vessels. Sinuses: No frontal/maxillary tenderness ENT/Mouth: EACs patent / TMs  nl. Nares clear without erythema, swelling, mucoid exudates. Oral hygiene is good. No erythema, swelling, or exudate. Tongue normal, non-obstructing. Tonsils not swollen  or erythematous. Hearing normal.  Neck: Supple, thyroid not palpable. No bruits, nodes or JVD. Respiratory: Respiratory effort normal.  BS equal and clear bilateral without rales, rhonci, wheezing or stridor. Cardio: Heart sounds are normal with regular rate and rhythm and no murmurs, rubs or gallops. Peripheral pulses are normal and equal bilaterally without edema. No aortic or femoral bruits. Chest: symmetric with normal excursions and percussion.  Abdomen: Soft, with Nl bowel sounds. Nontender, no guarding, rebound, hernias, masses, or organomegaly.  Lymphatics: Non tender without lymphadenopathy.  Musculoskeletal: Full ROM all peripheral extremities, joint stability, 5/5 strength, and normal gait. Skin: Warm and dry without rashes, lesions, cyanosis, clubbing or  ecchymosis.  Neuro: Cranial nerves intact, reflexes equal bilaterally. Normal muscle tone, no cerebellar symptoms. Sensation intact.  Pysch: Alert and oriented X 3 with normal affect, insight and judgment  appropriate.   Assessment and Plan  1. Annual Preventative/Screening Exam    2. Labile hypertension  - EKG 12-Lead - [place], RETROPERITNL ABD,  LTD - CBC with Differential/Platelet - COMPLETE METABOLIC PANEL WITH GFR - Magnesium - TSH  3. Hyperlipidemia, mixed  - EKG 12-Lead - [place], RETROPERITNL ABD,  LTD - Lipid panel - TSH  4. Abnormal glucose  - EKG 12-Lead - [place], RETROPERITNL ABD,  LTD - Hemoglobin A1c - Insulin, random  5. Vitamin D deficiency  - VITAMIN D 25 Hydroxy   6. Testosterone deficiency  - Testosterone  7. Prostate cancer screening  - PSA  8. Screening examination for pulmonary tuberculosis  - TB Skin Test  9. Screening for ischemic heart disease  - EKG 12-Lead  10. FH: hypertension  - EKG 12-Lead - [place], RETROPERITNL ABD,  LTD  11. Screening for AAA (aortic abdominal aneurysm)  - [place], RETROPERITNL ABD,  LTD  12. Fatigue, unspecified type  - Iron,Total/Total Iron Binding Cap - Vitamin B12 - Testosterone - CBC with Differential/Platelet - TSH  13. Medication management  - Urinalysis, Routine w reflex microscopic - Microalbumin / creatinine urine ratio - CBC with Differential/Platelet - COMPLETE METABOLIC PANEL WITH GFR - Magnesium - Lipid panel - TSH - Hemoglobin A1c - Insulin, random - VITAMIN D 25 Hydroxy  14. Screening for colorectal cancer  - POC Hemoccult Bld/Stl         Patient was counseled in prudent diet, weight control to achieve/maintain BMI less than 25, BP monitoring, regular exercise and medications as discussed.  Discussed med effects and SE's. Routine screening labs and tests as requested with regular follow-up as recommended. Over 40 minutes of exam, counseling, chart review and high complex critical decision making was performed   Kirtland Bouchard, MD

## 2020-08-26 NOTE — Patient Instructions (Signed)

## 2020-08-27 ENCOUNTER — Other Ambulatory Visit: Payer: Self-pay

## 2020-08-27 ENCOUNTER — Ambulatory Visit (INDEPENDENT_AMBULATORY_CARE_PROVIDER_SITE_OTHER): Payer: No Typology Code available for payment source | Admitting: Internal Medicine

## 2020-08-27 VITALS — BP 128/80 | HR 67 | Temp 97.4°F | Resp 16 | Ht 74.0 in | Wt 193.0 lb

## 2020-08-27 DIAGNOSIS — Z136 Encounter for screening for cardiovascular disorders: Secondary | ICD-10-CM | POA: Diagnosis not present

## 2020-08-27 DIAGNOSIS — Z8249 Family history of ischemic heart disease and other diseases of the circulatory system: Secondary | ICD-10-CM

## 2020-08-27 DIAGNOSIS — R0989 Other specified symptoms and signs involving the circulatory and respiratory systems: Secondary | ICD-10-CM

## 2020-08-27 DIAGNOSIS — E349 Endocrine disorder, unspecified: Secondary | ICD-10-CM

## 2020-08-27 DIAGNOSIS — Z125 Encounter for screening for malignant neoplasm of prostate: Secondary | ICD-10-CM

## 2020-08-27 DIAGNOSIS — Z23 Encounter for immunization: Secondary | ICD-10-CM

## 2020-08-27 DIAGNOSIS — E559 Vitamin D deficiency, unspecified: Secondary | ICD-10-CM

## 2020-08-27 DIAGNOSIS — Z111 Encounter for screening for respiratory tuberculosis: Secondary | ICD-10-CM | POA: Diagnosis not present

## 2020-08-27 DIAGNOSIS — R7309 Other abnormal glucose: Secondary | ICD-10-CM

## 2020-08-27 DIAGNOSIS — Z1212 Encounter for screening for malignant neoplasm of rectum: Secondary | ICD-10-CM

## 2020-08-27 DIAGNOSIS — R5383 Other fatigue: Secondary | ICD-10-CM

## 2020-08-27 DIAGNOSIS — Z79899 Other long term (current) drug therapy: Secondary | ICD-10-CM

## 2020-08-27 DIAGNOSIS — Z1211 Encounter for screening for malignant neoplasm of colon: Secondary | ICD-10-CM

## 2020-08-27 DIAGNOSIS — Z0000 Encounter for general adult medical examination without abnormal findings: Secondary | ICD-10-CM | POA: Diagnosis not present

## 2020-08-27 DIAGNOSIS — Z0001 Encounter for general adult medical examination with abnormal findings: Secondary | ICD-10-CM

## 2020-08-27 DIAGNOSIS — E782 Mixed hyperlipidemia: Secondary | ICD-10-CM

## 2020-08-27 MED ORDER — CLOBETASOL PROPIONATE 0.05 % EX OINT
TOPICAL_OINTMENT | CUTANEOUS | 3 refills | Status: DC
Start: 2020-08-27 — End: 2021-10-07

## 2020-08-28 ENCOUNTER — Other Ambulatory Visit: Payer: Self-pay | Admitting: Internal Medicine

## 2020-08-28 DIAGNOSIS — R972 Elevated prostate specific antigen [PSA]: Secondary | ICD-10-CM

## 2020-08-28 LAB — MICROALBUMIN / CREATININE URINE RATIO
Creatinine, Urine: 107 mg/dL (ref 20–320)
Microalb Creat Ratio: 7 mcg/mg creat (ref <30)
Microalb, Ur: 0.7 mg/dL

## 2020-08-28 LAB — URINALYSIS, ROUTINE W REFLEX MICROSCOPIC
Bilirubin Urine: NEGATIVE
Glucose, UA: NEGATIVE
Hgb urine dipstick: NEGATIVE
Ketones, ur: NEGATIVE
Leukocytes,Ua: NEGATIVE
Nitrite: NEGATIVE
Protein, ur: NEGATIVE
Specific Gravity, Urine: 1.017 (ref 1.001–1.03)
pH: 6 (ref 5.0–8.0)

## 2020-08-28 LAB — HEMOGLOBIN A1C
Hgb A1c MFr Bld: 5.1 % of total Hgb (ref <5.7)
Mean Plasma Glucose: 100 (calc)
eAG (mmol/L): 5.5 (calc)

## 2020-08-28 LAB — LIPID PANEL
Cholesterol: 136 mg/dL (ref <200)
HDL: 60 mg/dL (ref >=40)
LDL Cholesterol (Calc): 62 mg/dL (calc)
Non-HDL Cholesterol (Calc): 76 mg/dL (calc) (ref <130)
Total CHOL/HDL Ratio: 2.3 (calc) (ref <5.0)
Triglycerides: 64 mg/dL (ref <150)

## 2020-08-28 LAB — CBC WITH DIFFERENTIAL/PLATELET
Absolute Monocytes: 570 cells/uL (ref 200–950)
Basophils Absolute: 72 cells/uL (ref 0–200)
Basophils Relative: 1.2 %
Eosinophils Absolute: 240 cells/uL (ref 15–500)
Eosinophils Relative: 4 %
HCT: 44.5 % (ref 38.5–50.0)
Hemoglobin: 15.2 g/dL (ref 13.2–17.1)
Lymphs Abs: 2286 cells/uL (ref 850–3900)
MCH: 30.1 pg (ref 27.0–33.0)
MCHC: 34.2 g/dL (ref 32.0–36.0)
MCV: 88.1 fL (ref 80.0–100.0)
MPV: 10.3 fL (ref 7.5–12.5)
Monocytes Relative: 9.5 %
Neutro Abs: 2832 cells/uL (ref 1500–7800)
Neutrophils Relative %: 47.2 %
Platelets: 231 10*3/uL (ref 140–400)
RBC: 5.05 10*6/uL (ref 4.20–5.80)
RDW: 13.1 % (ref 11.0–15.0)
Total Lymphocyte: 38.1 %
WBC: 6 10*3/uL (ref 3.8–10.8)

## 2020-08-28 LAB — COMPLETE METABOLIC PANEL WITH GFR
AG Ratio: 2 (calc) (ref 1.0–2.5)
ALT: 31 U/L (ref 9–46)
AST: 19 U/L (ref 10–35)
Albumin: 4.3 g/dL (ref 3.6–5.1)
Alkaline phosphatase (APISO): 80 U/L (ref 35–144)
BUN: 20 mg/dL (ref 7–25)
CO2: 25 mmol/L (ref 20–32)
Calcium: 9.5 mg/dL (ref 8.6–10.3)
Chloride: 104 mmol/L (ref 98–110)
Creat: 0.78 mg/dL (ref 0.70–1.25)
GFR, Est African American: 111 mL/min/{1.73_m2} (ref >=60)
GFR, Est Non African American: 95 mL/min/{1.73_m2} (ref >=60)
Globulin: 2.2 g/dL (calc) (ref 1.9–3.7)
Glucose, Bld: 97 mg/dL (ref 65–99)
Potassium: 4.2 mmol/L (ref 3.5–5.3)
Sodium: 137 mmol/L (ref 135–146)
Total Bilirubin: 0.6 mg/dL (ref 0.2–1.2)
Total Protein: 6.5 g/dL (ref 6.1–8.1)

## 2020-08-28 LAB — INSULIN, RANDOM: Insulin: 3.6 u[IU]/mL

## 2020-08-28 LAB — TSH: TSH: 2.6 mIU/L (ref 0.40–4.50)

## 2020-08-28 LAB — IRON, TOTAL/TOTAL IRON BINDING CAP
%SAT: 29 % (calc) (ref 20–48)
Iron: 102 ug/dL (ref 50–180)
TIBC: 346 mcg/dL (calc) (ref 250–425)

## 2020-08-28 LAB — PSA: PSA: 3.42 ng/mL (ref <=4.0)

## 2020-08-28 LAB — VITAMIN D 25 HYDROXY (VIT D DEFICIENCY, FRACTURES): Vit D, 25-Hydroxy: 82 ng/mL (ref 30–100)

## 2020-08-28 LAB — MAGNESIUM: Magnesium: 1.9 mg/dL (ref 1.5–2.5)

## 2020-08-28 LAB — VITAMIN B12: Vitamin B-12: 671 pg/mL (ref 200–1100)

## 2020-08-28 LAB — TESTOSTERONE: Testosterone: 262 ng/dL (ref 250–827)

## 2020-08-28 NOTE — Progress Notes (Signed)
========================================================== ==========================================================  -    Iron & Vit B12 levels Normal  ==========================================================  -  PSA is in normal range, but is increased from previous years   - Suggest repeat lab  ---- PSA in about 6 months ==========================================================  -  Total Chol = 136 & LDL Chol = 62 - Both  Excellent   - Very low risk for Heart Attack  / Stroke ==========================================================  - A1c - Normal - Great - No Diabetes ! ==========================================================  -  Vitamin D -= 82 - Excellent  ==========================================================  -  All Else - CBC - Kidneys - Electrolytes - Liver - Magnesium & Thyroid    - all  Normal / OK ==========================================================   - Keep up the [place] Work  ! ==========================================================  -

## 2020-10-07 DIAGNOSIS — Z1211 Encounter for screening for malignant neoplasm of colon: Secondary | ICD-10-CM

## 2020-10-07 DIAGNOSIS — Z1212 Encounter for screening for malignant neoplasm of rectum: Secondary | ICD-10-CM

## 2020-10-19 NOTE — Progress Notes (Signed)
   History of Present Illness:      Patient is a very nice 64 yo single WM presenting with concerns re: multiple seborrheic keratoses of the trunk.  Medications   .  EPINEPHrine 0.3 mg/0.3 mL IJ SOAJ injection, Inject 0.3 mg into the muscle. Use prn .  tadalafil (CIALIS) 20 MG tablet, Take 1/2 to 1 tablet every 2 to 3 days as needed for XXXX  .  fexofenadine (ALLEGRA) 180 MG tablet, Take 1 tablet (180 mg total) by mouth daily. .  fluticasone (FLONASE) 50 MCG/ACT nasal spray, SPRAY 2 SPRAYS INTO EACH NOSTRIL EVERY DAY  .  Calcium Carbonate (CALCIUM 600 PO), Take 2 tablets by mouth daily. .  Cholecalciferol (VITAMIN D PO), Take 2,000 Units by mouth 2 (two) times daily.  .  clobetasol ointment (TEMOVATE) 0.05 %, APPLY TO AFFECTED AREA  2 TIMES DAILY. .  Magnesium 500 MG TABS, Take 1 tablet by mouth daily.  .  Multiple Vitamins-Minerals (MULTIVITAMIN PO), Take 1 tablet by mouth daily.  Marland Kitchen  OVER THE COUNTER MEDICATION, Saw Palmetto 2 tabs daily .  zinc gluconate 50 MG tablet, Take 50 mg by mouth daily.  Problem list He has Gastroesophageal reflux disease; Labile hypertension; Testosterone deficiency; Psoriasis; Vitamin D deficiency; Diabetes mellitus screening; Hyperlipidemia, mixed; Abnormal glucose; Hx of adenomatous polyp of colon; and Fatigue on their problem list.   Observations/Objective:  BP 116/60   Pulse 71   Temp (!) 97.2 F (36.2 C)   Resp 16   Ht 6\' 2"  (1.88 m)   Wt 199 lb 6.4 oz (90.4 kg)   SpO2 95%   BMI 25.60 kg/m   Noted 6-8 large crusty seborrheic keratoses of the trunk .  Assessment and Plan:  1. Seborrheic keratoses   - unable to treat today due to No Liquid Nitrogen   Follow Up Instructions:  - patient to re-schedule when we have Liquid nitrogen  Kirtland Bouchard, MD

## 2020-10-20 ENCOUNTER — Ambulatory Visit (INDEPENDENT_AMBULATORY_CARE_PROVIDER_SITE_OTHER): Payer: No Typology Code available for payment source | Admitting: Internal Medicine

## 2020-10-20 ENCOUNTER — Other Ambulatory Visit: Payer: Self-pay

## 2020-10-20 VITALS — BP 116/60 | HR 71 | Temp 97.2°F | Resp 16 | Ht 74.0 in | Wt 199.4 lb

## 2020-10-20 DIAGNOSIS — L821 Other seborrheic keratosis: Secondary | ICD-10-CM

## 2020-12-09 ENCOUNTER — Other Ambulatory Visit: Payer: Self-pay

## 2020-12-09 ENCOUNTER — Ambulatory Visit (INDEPENDENT_AMBULATORY_CARE_PROVIDER_SITE_OTHER): Payer: No Typology Code available for payment source | Admitting: Internal Medicine

## 2020-12-09 VITALS — BP 126/78 | HR 66 | Temp 96.3°F | Resp 16 | Ht 74.0 in | Wt 198.0 lb

## 2020-12-09 DIAGNOSIS — R0989 Other specified symptoms and signs involving the circulatory and respiratory systems: Secondary | ICD-10-CM

## 2020-12-09 DIAGNOSIS — L820 Inflamed seborrheic keratosis: Secondary | ICD-10-CM | POA: Diagnosis not present

## 2020-12-09 NOTE — Progress Notes (Signed)
   History of Present Illness:      Patient is a very nice 65 yo M monitored for labile HTN who presents today with concerns re: irritated raised crusty lesions on his trunk.   Medications  .  EPINEPHrine 0.3 mg/0.3 mL IJ SOAJ injection, Inject 0.3 mg into the muscle. Use prn .  tadalafil (CIALIS) 20 MG tablet, Take 1/2 to 1 tablet every 2 to 3 days as needed for XXXX  .  fexofenadine (ALLEGRA) 180 MG tablet, Take 1 tablet  daily. Marland Kitchen  FLONASE nasal spray, SPRAY 2 SPRAYS INTO EACH NOSTRIL EVERY DAY  .  CALCIUM 600 mg, Take 2 tablets  daily. Marland Kitchen  VITAMIN D , Take 2,000 Units  2 times daily.  .  clobetasol ointment (TEMOVATE) 0.05 %, APPLY TO rash 2 x/day .  Magnesium 500 MG , Take 1 tablet daily.  .  Multiple Vitamins-Minerals , Take 1 tablet daily.  Clarnce Flock Palmetto 2 tabs daily .  zinc 50 MG tablet, Take  daily.  Problem list He has Gastroesophageal reflux disease; Labile hypertension; Testosterone deficiency; Psoriasis; Vitamin D deficiency; Diabetes mellitus screening; Hyperlipidemia, mixed; Abnormal glucose; Hx of adenomatous polyp of colon; and Fatigue on their problem list.   Observations/Objective:   BP 126/78   Pulse 66   Temp (!) 96.3 F (35.7 C)   Resp 16   Ht 6\' 2"  (1.88 m)   Wt 198 lb (89.8 kg)   SpO2 93%   BMI 25.42 kg/m   HEENT - WNL. Neck - supple.  Chest - Clear equal BS. Cor - Nl HS. RRR w/o sig MGR. PP 1(+). No edema. Skin  - there are  # 6 irritated raised crusty irregular seborrheic keratoses measuring 5 mm x 20 mm scattered over anterior & posterior chest & abdomen  Procedure  (CPT:  17000 and 63817 x 5)     All above lesions treated with Liquid Nitrogen by Triple Freeze Everlean Patterson technique. Patient informed of post-op care.   Assessment and Plan:  1. Labile hypertension   2. Seborrheic keratoses, inflamed   Follow Up Instructions:     I discussed the assessment and treatment plan with the patient. The patient was provided an opportunity to ask  questions and all were answered. The patient agreed with the plan and demonstrated an understanding of the instructions.      The patient was advised to call back or seek an in-person evaluation if the symptoms worsen or if the condition fails to improve as anticipated.   Kirtland Bouchard, MD

## 2020-12-13 ENCOUNTER — Encounter: Payer: Self-pay | Admitting: Internal Medicine

## 2020-12-13 NOTE — Patient Instructions (Signed)
Seborrheic Keratosis A seborrheic keratosis is a common, noncancerous (benign) skin growth. These growths are velvety, waxy, rough, tan, brown, or black spots that appear on the skin. These skin growths can be flat or raised, and scaly. What are the causes? The cause of this condition is not known. What increases the risk? You are more likely to develop this condition if you:  Have a family history of seborrheic keratosis.  Are 50 or older.  Are pregnant.  Have had estrogen replacement therapy. What are the signs or symptoms? Symptoms of this condition include growths on the face, chest, shoulders, back, or other areas. These growths:  Are usually painless, but may become irritated and itchy.  Can be yellow, brown, black, or other colors.  Are slightly raised or have a flat surface.  Are sometimes rough or wart-like in texture.  Are often velvety or waxy on the surface.  Are round or oval-shaped.  Often occur in groups, but may occur as a single growth.  How is this diagnosed? This condition is diagnosed with a medical history and physical exam.  A sample of the growth may be tested (skin biopsy).  You may need to see a skin specialist (dermatologist). How is this treated? Treatment is not usually needed for this condition, unless the growths are irritated or bleed often.  You may also choose to have the growths removed if you do not like their appearance. ? Most commonly, these growths are treated with a procedure in which liquid nitrogen is applied to "freeze" off the growth (cryosurgery). ? They may also be burned off with electricity (electrocautery) or removed by scraping (curettage). Follow these instructions at home:  Watch your growth for any changes.  Keep all follow-up visits as told by your health care provider. This is important.  Do not scratch or pick at the growth or growths. This can cause them to become irritated or infected. Contact a health care  provider if:  You suddenly have many new growths.  Your growth bleeds, itches, or hurts.  Your growth suddenly becomes larger or changes color. Summary  A seborrheic keratosis is a common, noncancerous (benign) skin growth.  Treatment is not usually needed for this condition, unless the growths are irritated or bleed often.  Watch your growth for any changes.  Contact a health care provider if you suddenly have many new growths or your growth suddenly becomes larger or changes color.  Keep all follow-up visits as told by your health care provider. This is important.

## 2020-12-30 ENCOUNTER — Encounter: Payer: Self-pay | Admitting: Podiatry

## 2020-12-30 ENCOUNTER — Other Ambulatory Visit: Payer: Self-pay

## 2020-12-30 ENCOUNTER — Ambulatory Visit (INDEPENDENT_AMBULATORY_CARE_PROVIDER_SITE_OTHER): Payer: No Typology Code available for payment source | Admitting: Podiatry

## 2020-12-30 DIAGNOSIS — D2372 Other benign neoplasm of skin of left lower limb, including hip: Secondary | ICD-10-CM

## 2020-12-30 NOTE — Progress Notes (Signed)
He presents today for follow-up of a painful callused area to the plantar aspect of the left foot.  He states that these continue to come back.  Objective: Vital signs are stable he is alert oriented x3.  Pulses are palpable.  Solitary porokeratotic lesion subsecond metatarsophalangeal joint left foot.  Assessment: Pain in limb secondary to porokeratosis.  Plan: Sharp debridement today mechanical then placed salicylic acid under occlusion to be left on for 3 days and then washed off thoroughly follow-up with me on an as-needed basis.

## 2021-06-04 ENCOUNTER — Ambulatory Visit (INDEPENDENT_AMBULATORY_CARE_PROVIDER_SITE_OTHER): Payer: Medicare HMO

## 2021-06-04 ENCOUNTER — Ambulatory Visit (INDEPENDENT_AMBULATORY_CARE_PROVIDER_SITE_OTHER): Payer: Medicare HMO | Admitting: Podiatry

## 2021-06-04 ENCOUNTER — Other Ambulatory Visit: Payer: Self-pay

## 2021-06-04 DIAGNOSIS — M7752 Other enthesopathy of left foot: Secondary | ICD-10-CM

## 2021-06-04 DIAGNOSIS — M2042 Other hammer toe(s) (acquired), left foot: Secondary | ICD-10-CM | POA: Diagnosis not present

## 2021-06-04 MED ORDER — TRIAMCINOLONE ACETONIDE 40 MG/ML IJ SUSP
20.0000 mg | Freq: Once | INTRAMUSCULAR | Status: AC
  Administered 2021-06-04: 20 mg

## 2021-06-07 NOTE — Progress Notes (Signed)
He presents today chief complaint of pain to his toes #2 #3 of his left foot.  Objective: Vital signs are stable alert oriented x3 he has pain on palpation to the second and third toes of the left foot.  With obvious divergence between the second and third toes with malaligned second toe at the metatarsophalangeal joint.  Also demonstrates hammertoe deformities which appear to be rigid particularly at the level of the IPJ.  Radiographs taken today do demonstrate soft tissue swelling between the toes as well as the medial divergence of the second toe with severe hammertoe deformities.  Assessment: Hammertoe deformity with capsulitis most likely tear of the lateral collateral ligament of the plantar plate.  Hammertoe deformity resulting.  Plan: Discussed etiology pathology and surgical therapy discussed appropriate shoe gear stretching exercise ice therapy sugar modifications.  I injected the area today with 10 mg Kenalog 5 mg Marcaine around the joint.  He tolerated procedure well without complications.  Follow-up with him in about a month or so if not improved consider MRI.

## 2021-07-07 ENCOUNTER — Encounter: Payer: Self-pay | Admitting: Podiatry

## 2021-07-07 ENCOUNTER — Other Ambulatory Visit: Payer: Self-pay

## 2021-07-07 ENCOUNTER — Ambulatory Visit (INDEPENDENT_AMBULATORY_CARE_PROVIDER_SITE_OTHER): Payer: Medicare HMO | Admitting: Podiatry

## 2021-07-07 DIAGNOSIS — M7752 Other enthesopathy of left foot: Secondary | ICD-10-CM | POA: Diagnosis not present

## 2021-07-07 MED ORDER — DEXAMETHASONE SODIUM PHOSPHATE 120 MG/30ML IJ SOLN
2.0000 mg | Freq: Once | INTRAMUSCULAR | Status: AC
  Administered 2021-07-07: 2 mg via INTRA_ARTICULAR

## 2021-07-07 NOTE — Progress Notes (Signed)
He presents today for a follow-up of his capsulitis stating that the shot really helped but it did wear off a bit to the point where he now has off and on type symptoms.  Stating that he did have swelling between his second and third toe 1 day but it resolved quite quickly.  Objective: Vital signs are stable he is alert and oriented x3 hammertoe deformities #2 #3 of the left foot resulting in the capsulitis.  Though they do appear to be much decreased from their inflammatory response last visit.  He still has some tenderness on palpation to the plantar lateral aspect of the second metatarsal left foot.  Assessment: Probable chronic plantar plate inflammatory response or possible tear of the plantar plate or the collateral ligament.  Plan: Discussed etiology pathology and surgical therapies at this point I injected 2 mg of dexamethasone from the plantar surface into the point of maximal tenderness.  He tolerated the procedure well.  Should this not resolve his issues or should this recur we will send him for an MRI.

## 2021-08-18 ENCOUNTER — Other Ambulatory Visit: Payer: Self-pay

## 2021-08-18 ENCOUNTER — Ambulatory Visit: Payer: Medicare HMO | Admitting: Podiatry

## 2021-08-18 DIAGNOSIS — M778 Other enthesopathies, not elsewhere classified: Secondary | ICD-10-CM

## 2021-08-18 DIAGNOSIS — S99922A Unspecified injury of left foot, initial encounter: Secondary | ICD-10-CM

## 2021-08-18 DIAGNOSIS — M7752 Other enthesopathy of left foot: Secondary | ICD-10-CM | POA: Diagnosis not present

## 2021-08-18 NOTE — Addendum Note (Signed)
Addended by: Clovis Riley E on: 08/18/2021 12:58 PM   Modules accepted: Orders

## 2021-08-18 NOTE — Progress Notes (Signed)
He presents today for follow-up of his capsulitis and hammertoe deformity second metatarsophalangeal joint left foot.  States that he has reverted back pain to a chronic pain of approximately of 4 out of 10 every day.  States that he is just dealing with it he like to consider having it fixed.  Objective: Vital signs are stable alert and oriented x3.  Pulses are palpable.  Rigid hammertoe deformity second left with medial deviation hammertoe deformity third left as well.  Mild hallux valgus with noted.  Pain on direct palpation plantar lateral aspect of the second metatarsal.  Pain on end range of motion of the joint.  Assessment: Highly suspect tear of the plantar plate.  Plan: At this point I am going to recommend an MRI for full evaluation of the forefoot.  I will follow-up with him once the MRI is complete.  This is for surgical consideration as well as differential diagnosis.

## 2021-09-02 ENCOUNTER — Other Ambulatory Visit: Payer: Self-pay

## 2021-09-02 ENCOUNTER — Encounter: Payer: Self-pay | Admitting: Internal Medicine

## 2021-09-02 ENCOUNTER — Ambulatory Visit (INDEPENDENT_AMBULATORY_CARE_PROVIDER_SITE_OTHER): Payer: Medicare HMO | Admitting: Internal Medicine

## 2021-09-02 VITALS — BP 136/78 | HR 61 | Temp 97.7°F | Ht 73.5 in | Wt 199.0 lb

## 2021-09-02 DIAGNOSIS — E782 Mixed hyperlipidemia: Secondary | ICD-10-CM

## 2021-09-02 DIAGNOSIS — Z0000 Encounter for general adult medical examination without abnormal findings: Secondary | ICD-10-CM | POA: Diagnosis not present

## 2021-09-02 DIAGNOSIS — Z79899 Other long term (current) drug therapy: Secondary | ICD-10-CM

## 2021-09-02 DIAGNOSIS — E559 Vitamin D deficiency, unspecified: Secondary | ICD-10-CM | POA: Diagnosis not present

## 2021-09-02 DIAGNOSIS — N138 Other obstructive and reflux uropathy: Secondary | ICD-10-CM

## 2021-09-02 DIAGNOSIS — Z8249 Family history of ischemic heart disease and other diseases of the circulatory system: Secondary | ICD-10-CM | POA: Diagnosis not present

## 2021-09-02 DIAGNOSIS — Z136 Encounter for screening for cardiovascular disorders: Secondary | ICD-10-CM | POA: Diagnosis not present

## 2021-09-02 DIAGNOSIS — Z87891 Personal history of nicotine dependence: Secondary | ICD-10-CM

## 2021-09-02 DIAGNOSIS — R7309 Other abnormal glucose: Secondary | ICD-10-CM

## 2021-09-02 DIAGNOSIS — N401 Enlarged prostate with lower urinary tract symptoms: Secondary | ICD-10-CM | POA: Diagnosis not present

## 2021-09-02 DIAGNOSIS — Z125 Encounter for screening for malignant neoplasm of prostate: Secondary | ICD-10-CM | POA: Diagnosis not present

## 2021-09-02 DIAGNOSIS — R0989 Other specified symptoms and signs involving the circulatory and respiratory systems: Secondary | ICD-10-CM

## 2021-09-02 DIAGNOSIS — Z0001 Encounter for general adult medical examination with abnormal findings: Secondary | ICD-10-CM

## 2021-09-02 DIAGNOSIS — Z23 Encounter for immunization: Secondary | ICD-10-CM

## 2021-09-02 DIAGNOSIS — Z1211 Encounter for screening for malignant neoplasm of colon: Secondary | ICD-10-CM

## 2021-09-02 NOTE — Progress Notes (Signed)
Annual  Screening/Preventative Visit  & Comprehensive Evaluation & Examination  Future Appointments  Date Time Provider [place]  09/02/2021  2:00 PM Unk Pinto, MD [facility] None  09/02/2022  2:00 PM Unk Pinto, MD [facility] None            This very nice 65 y.o. MWM presents for a Screening /Preventative Visit & comprehensive evaluation and management of multiple medical co-morbidities.  Patient has been followed for labile HTN, HLD, GERD, Prediabetes and Vitamin D Deficiency.       Labile HTN predates since  2003. Patient's BP has been controlled at home.  Today's BP is at goal -  136/78. Patient denies any cardiac symptoms as chest pain, palpitations, shortness of breath, dizziness or ankle swelling.       Patient's hyperlipidemia is controlled with diet . Last lipids were at goal :  Lab Results  Component Value Date   CHOL 136 08/27/2020   HDL 60 08/27/2020   LDLCALC 62 08/27/2020   TRIG 64 08/27/2020   CHOLHDL 2.3 08/27/2020         Patient is monitored expectantly for glucose intolerance  and patient denies reactive hypoglycemic symptoms, visual blurring, diabetic polys or paresthesias. Last A1c was normal & at goal :   Lab Results  Component Value Date   HGBA1C 5.1 08/27/2020          Finally, patient has history of Vitamin D Deficiency ("44"/2008) and last vitamin D was at goal :   Lab Results  Component Value Date   VD25OH 82 08/27/2020     Current Outpatient Medications on File Prior to Visit  Medication Sig   CALCIUM 600  Take 2 tablets daily.   VITAMIN D 2,000 Units Take  2  times daily.    clobetasol ointment 0.05 % APPLY  2 TIMES DAILY.   EPINEPHrine 0.3 mginjec Inject 0.3 mg into the muscle.    fexofenadine 180 MG tablet Take 1 tablet daily.   FLONASE nasal spray 2 SPRAYS INTO EACH NOSTRIL Daily   Magnesium 500 MG TABS Take 1 tablet daily.    Multiple Vitamins-Minerals Take 1 tablet daily.    Saw Palmetto  2 tabs daily    zinc 50 MG tablet Take daily.      Allergies  Allergen Reactions   Wasp Venom Anaphylaxis    Wasp/Hornet/Bee   Androgel [Testosterone] Other (See Comments)    Headaches     Past Medical History:  Diagnosis Date   Allergy    GERD (gastroesophageal reflux disease)    Hx of adenomatous polyp of colon 10/27/2016   Labile hypertension    Other testicular hypofunction    Psoriasis      Health Maintenance  Topic Date Due   Zoster Vaccines- Shingrix (1 of 2) Never done   Pneumonia Vaccine 84+ Years old (2 - PCV) 11/01/2009   COVID-19 Vaccine (3 - Pfizer risk series) 03/17/2020   INFLUENZA VACCINE  06/01/2021   COLONOSCOPY (Pts 45-81yrs Insurance coverage will need to be confirmed)  10/28/2024   TETANUS/TDAP  06/21/2027   Hepatitis C Screening  Completed   HIV Screening  Completed   HPV VACCINES  Aged Out     Immunization History  Administered Date(s) Administered   Influenza Inj Mdck Quad 08/23/2018, 09/03/2019, 08/27/2020   Influenza Split 07/19/2013, 07/29/2014, 07/31/2015   Influenza,inj,quad  08/26/2016   PFIZER SARS-COV-2 Vacc 01/22/2020, 02/18/2020   PPD Test 07/11/2018, 07/30/2019, 08/27/2020   Pneumococcal 23 11/01/2008  Td 11/01/2006, 06/20/2017   Zoster, Live 08/26/2016    Last Colon - 10/29/2019 - Dr Carlean Purl - Recc 5 y f/u - due Dec 2025   Past Surgical History:  Procedure Laterality Date   COLONOSCOPY  10/2016   COLONOSCOPY N/A 10/2016   HEMORRHOID BANDING  04/2011   HERNIA REPAIR Right 1958   INSERTION OF MESH N/A 08/30/2014   Procedure: INSERTION OF MESH;  Surgeon: Dixie Boston, MD;  Location: [facility];  Service: General;  Laterality: N/A;   LAPAROSCOPIC INGUINAL HERNIA WITH UMBILICAL HERNIA N/A 88/50/2774   Procedure: LAPAROSCOPIC EXPLORATION AND REPAIR OF BILATERAL HERNIAS IN  GROINS AND UMBILICAL HERNIA REPAIR.;  Surgeon: Kylil Boston, MD;  Location: [place];  Service: General;  Laterality: N/A;   POLYPECTOMY       Family History  Problem  Relation Age of Onset   Hypertension Mother    Colon polyps Mother    Cancer Mother        Uterine   Uterine cancer Mother    COPD Father    Arthritis Father    Cancer Father        Prostate   Prostate cancer Father    Colon cancer Neg Hx    Esophageal cancer Neg Hx    Rectal cancer Neg Hx    Stomach cancer Neg Hx      Social History   Married - Spouse name - Antony Haste. No children Tobacco Use   Smoking status: Former    Types: Cigarettes    Quit date: 09/28/1996    Years since quitting: 24.9   Smokeless tobacco: Never  Substance Use Topics   Alcohol use: Yes    Alcohol/week: 14.0 standard drinks    Types: 14 Glasses of wine per week    Comment: daily  wine   Drug use: No      ROS Constitutional: Denies fever, chills, weight loss/gain, headaches, insomnia,  night sweats or change in appetite. Does c/o fatigue. Eyes: Denies redness, blurred vision, diplopia, discharge, itchy or watery eyes.  ENT: Denies discharge, congestion, post nasal drip, epistaxis, sore throat, earache, hearing loss, dental pain, Tinnitus, Vertigo, Sinus pain or snoring.  Cardio: Denies chest pain, palpitations, irregular heartbeat, syncope, dyspnea, diaphoresis, orthopnea, PND, claudication or edema Respiratory: denies cough, dyspnea, DOE, pleurisy, hoarseness, laryngitis or wheezing.  Gastrointestinal: Denies dysphagia, heartburn, reflux, water brash, pain, cramps, nausea, vomiting, bloating, diarrhea, constipation, hematemesis, melena, hematochezia, jaundice or hemorrhoids Genitourinary: Denies dysuria, frequency, urgency, nocturia, hesitancy, discharge, hematuria or flank pain Musculoskeletal: Denies arthralgia, myalgia, stiffness, Jt. Swelling, pain, limp or strain/sprain. Denies Falls. Skin: Denies puritis, rash, hives, warts, acne, eczema or change in skin lesion Neuro: No weakness, tremor, incoordination, spasms, paresthesia or pain Psychiatric: Denies confusion, memory loss or sensory loss.  Denies Depression. Endocrine: Denies change in weight, skin, hair change, nocturia, and paresthesia, diabetic polys, visual blurring or hyper / hypo glycemic episodes.  Heme/Lymph: No excessive bleeding, bruising or enlarged lymph nodes.   Physical Exam  BP 136/78   Pulse 61   Temp 97.7 F (36.5 C)   Ht 6' 1.5" (1.867 m)   Wt 199 lb (90.3 kg)   SpO2 98%   BMI 25.90 kg/m   General Appearance: Well nourished and well groomed and in no apparent distress.  Eyes: PERRLA, EOMs, conjunctiva no swelling or erythema, normal fundi and vessels. Sinuses: No frontal/maxillary tenderness ENT/Mouth: EACs patent / TMs  nl. Nares clear without erythema, swelling, mucoid exudates. Oral hygiene is good. No erythema,  swelling, or exudate. Tongue normal, non-obstructing. Tonsils not swollen or erythematous. Hearing normal.  Neck: Supple, thyroid not palpable. No bruits, nodes or JVD. Respiratory: Respiratory effort normal.  BS equal and clear bilateral without rales, rhonci, wheezing or stridor. Cardio: Heart sounds are normal with regular rate and rhythm and no murmurs, rubs or gallops. Peripheral pulses are normal and equal bilaterally without edema. No aortic or femoral bruits. Chest: symmetric with normal excursions and percussion.  Abdomen: Soft, with Nl bowel sounds. Nontender, no guarding, rebound, hernias, masses, or organomegaly.  Lymphatics: Non tender without lymphadenopathy.  Musculoskeletal: Full ROM all peripheral extremities, joint stability, 5/5 strength, and normal gait. Skin: Warm and dry without rashes, lesions, cyanosis, clubbing or  ecchymosis.  Neuro: Cranial nerves intact, reflexes equal bilaterally. Normal muscle tone, no cerebellar symptoms. Sensation intact.  Pysch: Alert and oriented X 3 with normal affect, insight and judgment appropriate.   Assessment and Plan  1. Annual Preventative/Screening Exam    2. Labile hypertension  - EKG 12-Lead - [place], RETROPERITNL ABD,   LTD - Urinalysis, Routine w reflex microscopic - Microalbumin / creatinine urine ratio - CBC with Differential/Platelet - COMPLETE METABOLIC PANEL WITH GFR - Magnesium - TSH  3. Hyperlipidemia, mixed  - EKG 12-Lead - [place], RETROPERITNL ABD,  LTD - Lipid panel - TSH  4. Abnormal glucose  - EKG 12-Lead - [place], RETROPERITNL ABD,  LTD - Hemoglobin A1c - Insulin, random  5. Vitamin D deficiency  - VITAMIN D 25 Hydroxy   6. BPH with obstruction/lower urinary tract symptoms  - PSA  7. Screening for colorectal cancer  - POC Hemoccult Bld/Stl   8. Prostate cancer screening  - PSA  9. Screening for ischemic heart disease  - EKG 12-Lead  10. FH: hypertension  - EKG 12-Lead - [place], RETROPERITNL ABD,  LTD  11. Former smoker  - EKG 12-Lead - [place], RETROPERITNL ABD,  LTD  12. Screening for AAA (aortic abdominal aneurysm)  - [place], RETROPERITNL ABD,  LTD  13. Medication management - Urinalysis, Routine w reflex microscopic - Microalbumin / creatinine urine ratio - CBC with Differential/Platelet - COMPLETE METABOLIC PANEL WITH GFR - Magnesium - Lipid panel - TSH - Hemoglobin A1c - Insulin, random - VITAMIN D 25 Hydroxy          Patient was counseled in prudent diet, weight control to achieve/maintain BMI less than 25, BP monitoring, regular exercise and medications as discussed.  Discussed med effects and SE's. Routine screening labs and tests as requested with regular follow-up as recommended. Over 40 minutes of exam, counseling, chart review and high complex critical decision making was performed   Kirtland Bouchard, MD

## 2021-09-02 NOTE — Patient Instructions (Signed)

## 2021-09-03 LAB — COMPLETE METABOLIC PANEL WITH GFR
AG Ratio: 2.2 (calc) (ref 1.0–2.5)
ALT: 37 U/L (ref 9–46)
AST: 23 U/L (ref 10–35)
Albumin: 4.4 g/dL (ref 3.6–5.1)
Alkaline phosphatase (APISO): 91 U/L (ref 35–144)
BUN/Creatinine Ratio: 32 (calc) — ABNORMAL HIGH (ref 6–22)
BUN: 22 mg/dL (ref 7–25)
CO2: 22 mmol/L (ref 20–32)
Calcium: 9.5 mg/dL (ref 8.6–10.3)
Chloride: 105 mmol/L (ref 98–110)
Creat: 0.69 mg/dL — ABNORMAL LOW (ref 0.70–1.35)
Globulin: 2 g/dL (calc) (ref 1.9–3.7)
Glucose, Bld: 91 mg/dL (ref 65–99)
Potassium: 4.5 mmol/L (ref 3.5–5.3)
Sodium: 137 mmol/L (ref 135–146)
Total Bilirubin: 0.6 mg/dL (ref 0.2–1.2)
Total Protein: 6.4 g/dL (ref 6.1–8.1)
eGFR: 103 mL/min/{1.73_m2} (ref >=60)

## 2021-09-03 LAB — CBC WITH DIFFERENTIAL/PLATELET
Absolute Monocytes: 598 cells/uL (ref 200–950)
Basophils Absolute: 72 cells/uL (ref 0–200)
Basophils Relative: 1.1 %
Eosinophils Absolute: 228 cells/uL (ref 15–500)
Eosinophils Relative: 3.5 %
HCT: 43.5 % (ref 38.5–50.0)
Hemoglobin: 15.1 g/dL (ref 13.2–17.1)
Lymphs Abs: 2100 cells/uL (ref 850–3900)
MCH: 30.8 pg (ref 27.0–33.0)
MCHC: 34.7 g/dL (ref 32.0–36.0)
MCV: 88.8 fL (ref 80.0–100.0)
MPV: 10.4 fL (ref 7.5–12.5)
Monocytes Relative: 9.2 %
Neutro Abs: 3504 cells/uL (ref 1500–7800)
Neutrophils Relative %: 53.9 %
Platelets: 224 10*3/uL (ref 140–400)
RBC: 4.9 10*6/uL (ref 4.20–5.80)
RDW: 13 % (ref 11.0–15.0)
Total Lymphocyte: 32.3 %
WBC: 6.5 10*3/uL (ref 3.8–10.8)

## 2021-09-03 LAB — MICROALBUMIN / CREATININE URINE RATIO
Creatinine, Urine: 74 mg/dL (ref 20–320)
Microalb, Ur: <0.2 mg/dL

## 2021-09-03 LAB — URINALYSIS, ROUTINE W REFLEX MICROSCOPIC
Bilirubin Urine: NEGATIVE
Glucose, UA: NEGATIVE
Hgb urine dipstick: NEGATIVE
Ketones, ur: NEGATIVE
Leukocytes,Ua: NEGATIVE
Nitrite: NEGATIVE
Protein, ur: NEGATIVE
Specific Gravity, Urine: 1.017 (ref 1.001–1.035)
pH: 5.5 (ref 5.0–8.0)

## 2021-09-03 LAB — TSH: TSH: 3.15 mIU/L (ref 0.40–4.50)

## 2021-09-03 LAB — MAGNESIUM: Magnesium: 1.8 mg/dL (ref 1.5–2.5)

## 2021-09-03 LAB — INSULIN, RANDOM: Insulin: 8 u[IU]/mL

## 2021-09-03 LAB — LIPID PANEL
Cholesterol: 149 mg/dL (ref <200)
HDL: 60 mg/dL (ref >=40)
LDL Cholesterol (Calc): 69 mg/dL (calc)
Non-HDL Cholesterol (Calc): 89 mg/dL (calc) (ref <130)
Total CHOL/HDL Ratio: 2.5 (calc) (ref <5.0)
Triglycerides: 118 mg/dL (ref <150)

## 2021-09-03 LAB — PSA: PSA: 2.03 ng/mL (ref <=4.00)

## 2021-09-03 LAB — HEMOGLOBIN A1C
Hgb A1c MFr Bld: 5.1 % of total Hgb (ref <5.7)
Mean Plasma Glucose: 100 mg/dL
eAG (mmol/L): 5.5 mmol/L

## 2021-09-03 LAB — VITAMIN D 25 HYDROXY (VIT D DEFICIENCY, FRACTURES): Vit D, 25-Hydroxy: 70 ng/mL (ref 30–100)

## 2021-09-04 NOTE — Progress Notes (Signed)
============================================================ -   Test results slightly outside the reference range are not unusual. If there is anything important, I will review this with you,  otherwise it is considered normal test values.  If you have further questions,  please do not hesitate to contact me at the office or via My Chart.  ============================================================ ============================================================  -  PSA - Low - Great ! ============================================================ ============================================================  -   -  Total  Chol =    149    -             (  Ideal  or  Goal is less than 180  !  )   - and   -  Bad / Dangerous LDL  Chol =   69    -               (  Ideal  or  Goal is less than 70  !  )   - Both  Excellent   - Very low risk for Heart Attack  / Stroke ============================================================ ============================================================  -  A1c - Normal  - No Diabetes   - Great ! ============================================================ ============================================================  -  Vitamin D = 70 - Excellent  - Please continue dose same ============================================================ ============================================================  -  All Else - CBC - Kidneys - Electrolytes - Liver - Magnesium & Thyroid    - all  Normal / OK ============================================================ ============================================================  -  Keep up the [place] Work  !  ============================================================ ============================================================

## 2021-09-06 ENCOUNTER — Ambulatory Visit
Admission: RE | Admit: 2021-09-06 | Discharge: 2021-09-06 | Disposition: A | Discharge status: Home / Self Care | Payer: Self-pay | Source: Ambulatory Visit | Attending: Podiatry | Admitting: Podiatry

## 2021-09-06 DIAGNOSIS — S99922A Unspecified injury of left foot, initial encounter: Secondary | ICD-10-CM

## 2021-09-07 DIAGNOSIS — R600 Localized edema: Secondary | ICD-10-CM | POA: Diagnosis not present

## 2021-09-07 DIAGNOSIS — M19072 Primary osteoarthritis, left ankle and foot: Secondary | ICD-10-CM | POA: Diagnosis not present

## 2021-09-07 DIAGNOSIS — M7989 Other specified soft tissue disorders: Secondary | ICD-10-CM | POA: Diagnosis not present

## 2021-09-15 ENCOUNTER — Other Ambulatory Visit: Payer: Self-pay

## 2021-09-15 ENCOUNTER — Encounter: Payer: Self-pay | Admitting: Podiatry

## 2021-09-15 ENCOUNTER — Ambulatory Visit: Payer: Medicare HMO | Admitting: Podiatry

## 2021-09-15 DIAGNOSIS — M2042 Other hammer toe(s) (acquired), left foot: Secondary | ICD-10-CM

## 2021-09-15 DIAGNOSIS — M2012 Hallux valgus (acquired), left foot: Secondary | ICD-10-CM | POA: Diagnosis not present

## 2021-09-15 DIAGNOSIS — M21962 Unspecified acquired deformity of left lower leg: Secondary | ICD-10-CM

## 2021-09-15 NOTE — Progress Notes (Signed)
Presents today for follow-up of his MRI left foot.  States that is not better yet.  I guess I will have to have surgery to correct it.  Objective: Vital signs stable alert and oriented x3.  I have reviewed his past medical history medications allergies surgeries and social history.  Pulses are palpable.  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  Plan: Discussed etiology pathology conservative versus surgical therapies.  At this point we consented him for an Liane Comber Akin osteotomy of the first as well as a osteotomy of the second metatarsal and correction and repair of the plantar plate.  We also will perform a hammertoe repair with external pin second toe.  We discussed the possible postop complications which may include but not limited to postop pain bleeding swelling infection recurrence need for further surgery overcorrection under correction loss of digit loss of limb loss of life pin tract infection and sepsis.  I answered all of his questions to the best my ability layman's terms he understands and is amenable to it signed all 3 pages of the consent form.  He was provided information about the surgery center anesthesia group and was seen by the scheduler for scheduling.

## 2021-10-07 ENCOUNTER — Other Ambulatory Visit: Payer: Self-pay | Admitting: Internal Medicine

## 2021-10-09 ENCOUNTER — Telehealth: Payer: Self-pay | Admitting: Urology

## 2021-10-09 NOTE — Telephone Encounter (Signed)
DOS - 10/23/21  AIKEN OSTEOTOMY LEFT ---- [id number] LEFT --- [id number] METATARSAL OSTEOTOMY 2 LEFT --- 43276 HAMMERTOE REPAIR 2 LEFT --- [id number]  [employer] EFFECTIVE DATE - 06/01/21  SPOKE WITH SHENNA W. WITH [employer] AND SHE STATED THAT FOR CPT CODES 29574, [id number], [id number] ANS [phone] NO PRIOR AUTH IS REQUIRED.   REF # [id number]

## 2021-10-19 NOTE — Progress Notes (Signed)
MEDICARE ANNUAL WELLNESS VISIT AND FOLLOW UP Assessment:  Christopher Lawrence was seen today for medicare wellness.  Diagnoses and all orders for this visit:  Welcome to Medicare preventive visit  Due Annually  Health Maintenance Reviewed  Healthy lifestyle reviewed and goals set   Labile hypertension - continue DASH diet, exercise and monitor at home. Call if greater than 130/80.    Hyperlipidemia, mixed Controlled without medication, continue diet and exercise Limit saturated fats  Abnormal glucose Continue diet and exercise Limit processed carbohydrates  Vitamin D deficiency Continue supplementation to achieve therapeutic goal of 60-100  Medication management continued  Overweight with body mass index (BMI) of 26 to 26.9 in adult Long discussion about weight loss, diet, and exercise Recommended diet heavy in fruits and veggies and low in animal meats, cheeses, and dairy products, appropriate calorie intake Follow up at next visit   Need for pneumococcal vaccine Prevnar 20 given today   Over 30 minutes of exam, counseling, chart review, and critical decision making was performed  Future Appointments  Date Time Provider [place]  10/29/2021  9:00 AM [place], [place] T, DPM [facility]  11/05/2021  1:15 PM [place], Max T, DPM [facility]  11/19/2021  1:15 PM [place] Dense T, DPM [facility]  12/08/2021  1:15 PM [place], Max T, DPM [facility]  09/02/2022  2:00 PM Unk Pinto, MD [facility] None  04/22/2023 11:00 AM Magda Bernheim, NP [facility] None     Plan:   During the course of the visit the patient was educated and counseled about appropriate screening and preventive services including:   Pneumococcal vaccine  Influenza vaccine Prevnar 13 Td vaccine Screening electrocardiogram Colorectal cancer screening Diabetes screening Glaucoma screening Nutrition counseling    Subjective:  Christopher Lawrence is a 65 y.o. male who presents for  Medicare Annual Wellness Visit and 3 month follow up for HTN, hyperlipidemia, prediabetes, and vitamin D Def.   He has been having pain in his left foot. Having surgery in 2 days to correct 2 hammer toes in left foot.  His blood pressure has been controlled at home, today their BP is BP: 130/62 He does workout. He denies chest pain, shortness of breath, dizziness.  He is not on cholesterol medication and denies myalgias. His cholesterol is at goal. The cholesterol last visit was:   Lab Results  Component Value Date   CHOL 149 09/02/2021   HDL 60 09/02/2021   LDLCALC 69 09/02/2021   TRIG 118 09/02/2021   CHOLHDL 2.5 09/02/2021   He has been working on diet and exercise for abnormal glucose. Last A1C in the office was:  Lab Results  Component Value Date   HGBA1C 5.1 09/02/2021   Last GFR Lab Results  Component Value Date   GFRNONAA 95 08/27/2020     Patient is on Vitamin D supplement.   Lab Results  Component Value Date   VD25OH 70 09/02/2021      BMI is Body mass index is 26.55 kg/m., he has been working on diet and exercise. Wt Readings from Last 3 Encounters:  10/21/21 204 lb (92.5 kg)  09/02/21 199 lb (90.3 kg)  12/09/20 198 lb (89.8 kg)   Lab Results  Component Value Date   PSA 2.03 09/02/2021   PSA 3.42 08/27/2020   PSA 1.8 07/30/2019      Medication Review:   Current Outpatient Medications (Cardiovascular):    EPINEPHrine 0.3 mg/0.3 mL IJ SOAJ injection, Inject 0.3 mg into the muscle. Use prn (  Patient not taking: Reported on 10/21/2021)   tadalafil (CIALIS) 20 MG tablet, Take 1/2 to 1 tablet every 2 to 3 days as needed for XXXX (Patient not taking: Reported on 09/02/2021)  Current Outpatient Medications (Respiratory):    fexofenadine (ALLEGRA) 180 MG tablet, Take 1 tablet (180 mg total) by mouth daily.   fluticasone (FLONASE) 50 MCG/ACT nasal spray, SPRAY 2 SPRAYS INTO EACH NOSTRIL EVERY DAY    Current Outpatient Medications (Other):    BIOTIN PO, Take  by mouth.   Calcium Carbonate (CALCIUM 600 PO), Take 2 tablets by mouth daily.   Cholecalciferol (VITAMIN D PO), Take 2,000 Units by mouth 2 (two) times daily.    clobetasol ointment (TEMOVATE) 0.05 %, APPLY TO AFFECTED AREA TWICE A DAY   Magnesium 500 MG TABS, Take 1 tablet by mouth daily.    Multiple Vitamins-Minerals (MULTIVITAMIN PO), Take 1 tablet by mouth daily.    OVER THE COUNTER MEDICATION, Saw Palmetto 2 tabs daily   zinc gluconate 50 MG tablet, Take 50 mg by mouth daily.  Allergies: Allergies  Allergen Reactions   Wasp Venom Anaphylaxis    Wasp/Hornet/Bee   Androgel [Testosterone] Other (See Comments)    Headaches    Current Problems (verified) has Gastroesophageal reflux disease; Labile hypertension; Testosterone deficiency; Psoriasis; Vitamin D deficiency; Diabetes mellitus screening; Hyperlipidemia, mixed; Abnormal glucose; Hx of adenomatous polyp of colon; and Fatigue on their problem list.  Screening Tests Immunization History  Administered Date(s) Administered   Influenza Inj Mdck Quad With Preservative 09/30/2017, 08/23/2018, 09/03/2019, 08/27/2020   Influenza Split 07/19/2013, 07/29/2014, 07/31/2015   Influenza, High Dose Seasonal PF 09/02/2021   Influenza,inj,quad, With Preservative 08/26/2016   PFIZER(Purple Top)SARS-COV-2 Vaccination 01/22/2020, 02/18/2020, 09/12/2020, 05/19/2021   PPD Test 07/29/2014, 07/31/2015, 08/26/2016, 06/20/2017, 07/11/2018, 07/30/2019, 08/27/2020   Pneumococcal Polysaccharide-23 11/01/2008   Td 11/01/2006, 06/20/2017   Zoster, Live 08/26/2016    Preventative care: Last colonoscopy: 10/29/19 Dr. Carlean Purl due 2025  Prior vaccinations: TD or Tdap: 2018  Influenza: 09/02/21  Pneumococcal: 10/21/2021 (Prevnar 20) Prevnar13:  Shingles/Zostavax: zoster live 2017  Names of Other Physician/Practitioners you currently use: 1. [facility] here for primary care 2. Dr. Sabra Heck , eye doctor, last visit  2022 3. Dr Randol Kern, dentist, last visit 2022 Patient Care Team: Unk Pinto, MD as PCP - General (Internal Medicine) Festus Aloe, MD as Consulting Physician (Urology) Richmond Campbell, MD as Consulting Physician (Gastroenterology)  Surgical: He  has a past surgical history that includes Hemorrhoid banding (04/2011); Hernia repair (Right, 1958); Colonoscopy (10/2016); Laparoscopic inguinal hernia with umbilical hernia (N/A, 09/73/5329); Insertion of mesh (N/A, 08/30/2014); Colonoscopy (N/A, 10/2016); and Polypectomy. Family His family history includes Arthritis in his father; COPD in his father; Cancer in his father and mother; Colon polyps in his mother; Hypertension in his mother; Prostate cancer in his father; Uterine cancer in his mother. Social history  He reports that he quit smoking about 25 years ago. His smoking use included cigarettes. He has never used smokeless tobacco. He reports current alcohol use of about 14.0 standard drinks per week. He reports that he does not use drugs.  MEDICARE WELLNESS OBJECTIVES: Physical activity: Current Exercise Habits: Home exercise routine, Type of exercise: walking, Time (Minutes): 60, Frequency (Times/Week): 6, Weekly Exercise (Minutes/Week): 360, Intensity: Mild, Exercise limited by: None identified Cardiac risk factors: Cardiac Risk Factors include: advanced age (>54men, >63 women);male gender Depression/mood screen:   Depression screen [facility] 2/9 10/21/2021  Decreased Interest 0  Down, Depressed, Hopeless 0  PHQ - 2 Score 0    ADLs:  In your present state of health, do you have any difficulty performing the following activities: 10/21/2021 09/02/2021  Hearing? N N  Vision? N N  Difficulty concentrating or making decisions? N N  Walking or climbing stairs? N N  Dressing or bathing? N N  Doing errands, shopping? N N  Some recent data might be hidden     Cognitive Testing  Alert? Yes  Normal Appearance?Yes  Oriented to person?  Yes  Place? Yes   Time? Yes  Recall of three objects?  Yes  Can perform simple calculations? Yes  Displays appropriate judgment?Yes  Can read the correct time from a watch face?Yes  EOL planning: Does Patient Have a Medical Advance Directive?: Yes Type of Advance Directive: Healthcare Power of Attorney, Living will Does patient want to make changes to medical advance directive?: No - Patient declined Copy of [place] in Chart?: No - copy requested   Objective:   Today's Vitals   10/21/21 1429  BP: 130/62  Pulse: 64  Temp: 97.7 F (36.5 C)  SpO2: 97%  Weight: 204 lb (92.5 kg)   Body mass index is 26.55 kg/m.  General appearance: alert, no distress, WD/WN, male HEENT: normocephalic, sclerae anicteric, TMs pearly, nares patent, no discharge or erythema, pharynx normal Oral cavity: MMM, no lesions Neck: supple, no lymphadenopathy, no thyromegaly, no masses Heart: RRR, normal S1, S2, no murmurs Lungs: CTA bilaterally, no wheezes, rhonchi, or rales Abdomen: +bs, soft, non tender, non distended, no masses, no hepatomegaly, no splenomegaly Musculoskeletal: nontender, no swelling, no obvious deformity Extremities: no edema, no cyanosis, no clubbing Pulses: 2+ symmetric, upper and lower extremities, normal cap refill Neurological: alert, oriented x 3, CN2-12 intact, strength normal upper extremities and lower extremities, sensation normal throughout, DTRs 2+ throughout, no cerebellar signs, gait normal Psychiatric: normal affect, behavior normal, pleasant   Medicare Attestation I have personally reviewed: The patient's medical and social history Their use of alcohol, tobacco or illicit drugs Their current medications and supplements The patient's functional ability including ADLs,fall risks, home safety risks, cognitive, and hearing and visual impairment Diet and physical activities Evidence for depression or mood disorders  The patient's weight, height,  BMI, and visual acuity have been recorded in the chart.  I have made referrals, counseling, and provided education to the patient based on review of the above and I have provided the patient with a written personalized care plan for preventive services.     Magda Bernheim, NP   10/21/2021

## 2021-10-21 ENCOUNTER — Encounter: Payer: Self-pay | Admitting: Nurse Practitioner

## 2021-10-21 ENCOUNTER — Other Ambulatory Visit: Payer: Self-pay

## 2021-10-21 ENCOUNTER — Ambulatory Visit (INDEPENDENT_AMBULATORY_CARE_PROVIDER_SITE_OTHER): Payer: Medicare HMO | Admitting: Nurse Practitioner

## 2021-10-21 VITALS — BP 130/62 | HR 64 | Temp 97.7°F | Wt 204.0 lb

## 2021-10-21 DIAGNOSIS — R7309 Other abnormal glucose: Secondary | ICD-10-CM

## 2021-10-21 DIAGNOSIS — R0989 Other specified symptoms and signs involving the circulatory and respiratory systems: Secondary | ICD-10-CM

## 2021-10-21 DIAGNOSIS — Z6826 Body mass index (BMI) 26.0-26.9, adult: Secondary | ICD-10-CM

## 2021-10-21 DIAGNOSIS — Z79899 Other long term (current) drug therapy: Secondary | ICD-10-CM

## 2021-10-21 DIAGNOSIS — R6889 Other general symptoms and signs: Secondary | ICD-10-CM

## 2021-10-21 DIAGNOSIS — Z87891 Personal history of nicotine dependence: Secondary | ICD-10-CM | POA: Diagnosis not present

## 2021-10-21 DIAGNOSIS — E663 Overweight: Secondary | ICD-10-CM | POA: Diagnosis not present

## 2021-10-21 DIAGNOSIS — R972 Elevated prostate specific antigen [PSA]: Secondary | ICD-10-CM

## 2021-10-21 DIAGNOSIS — Z0001 Encounter for general adult medical examination with abnormal findings: Secondary | ICD-10-CM

## 2021-10-21 DIAGNOSIS — E559 Vitamin D deficiency, unspecified: Secondary | ICD-10-CM

## 2021-10-21 DIAGNOSIS — E782 Mixed hyperlipidemia: Secondary | ICD-10-CM | POA: Diagnosis not present

## 2021-10-21 DIAGNOSIS — Z23 Encounter for immunization: Secondary | ICD-10-CM | POA: Diagnosis not present

## 2021-10-21 DIAGNOSIS — Z0000 Encounter for general adult medical examination without abnormal findings: Secondary | ICD-10-CM

## 2021-10-22 ENCOUNTER — Other Ambulatory Visit: Payer: Self-pay | Admitting: Podiatry

## 2021-10-22 MED ORDER — OXYCODONE-ACETAMINOPHEN 10-325 MG PO TABS: 1.0000 | ORAL_TABLET | Freq: Three times a day (TID) | ORAL | 0 refills | Status: AC | PRN

## 2021-10-22 MED ORDER — ONDANSETRON HCL 4 MG PO TABS: 4.0000 mg | ORAL_TABLET | Freq: Three times a day (TID) | ORAL | 0 refills | Status: DC | PRN

## 2021-10-22 MED ORDER — CEPHALEXIN 500 MG PO CAPS: 500.0000 mg | ORAL_CAPSULE | Freq: Three times a day (TID) | ORAL | 0 refills | Status: DC

## 2021-10-27 DIAGNOSIS — M205X2 Other deformities of toe(s) (acquired), left foot: Secondary | ICD-10-CM | POA: Diagnosis not present

## 2021-10-27 DIAGNOSIS — M2012 Hallux valgus (acquired), left foot: Secondary | ICD-10-CM

## 2021-10-27 DIAGNOSIS — M21542 Acquired clubfoot, left foot: Secondary | ICD-10-CM

## 2021-10-27 DIAGNOSIS — M2042 Other hammer toe(s) (acquired), left foot: Secondary | ICD-10-CM

## 2021-10-27 DIAGNOSIS — M25572 Pain in left ankle and joints of left foot: Secondary | ICD-10-CM | POA: Diagnosis not present

## 2021-10-29 ENCOUNTER — Encounter: Payer: Medicare HMO | Admitting: Podiatry

## 2021-11-03 ENCOUNTER — Other Ambulatory Visit: Payer: Self-pay

## 2021-11-03 ENCOUNTER — Encounter: Payer: Self-pay | Admitting: Podiatry

## 2021-11-03 ENCOUNTER — Ambulatory Visit (INDEPENDENT_AMBULATORY_CARE_PROVIDER_SITE_OTHER): Payer: Medicare HMO

## 2021-11-03 ENCOUNTER — Ambulatory Visit (INDEPENDENT_AMBULATORY_CARE_PROVIDER_SITE_OTHER): Payer: Medicare HMO | Admitting: Podiatry

## 2021-11-03 DIAGNOSIS — Z9889 Other specified postprocedural states: Secondary | ICD-10-CM

## 2021-11-03 DIAGNOSIS — M2012 Hallux valgus (acquired), left foot: Secondary | ICD-10-CM

## 2021-11-03 DIAGNOSIS — M21962 Unspecified acquired deformity of left lower leg: Secondary | ICD-10-CM

## 2021-11-03 DIAGNOSIS — M2042 Other hammer toe(s) (acquired), left foot: Secondary | ICD-10-CM

## 2021-11-04 NOTE — Progress Notes (Signed)
He presents today date of surgery 10/27/2021 left foot Austin bunionectomy Akin osteotomy second metatarsal osteotomy hammertoe repair second.  States that it was pretty bad with the pain but now is just a little stinging he states that he still been bleeding.  When we asked him if he had been off of the foot he states that he tries to stay off of it is much as possible but over the holidays he did have to go to see his mother and has been on his foot quite a bit.  Objective: Vital signs are stable he is alert and oriented x3 dry sterile dressing was removed demonstrates dried blood as well as some active bleeding.  The epidermis is separated from the dermis with blood blisters from the edema.  Currently the sutures are maintaining closure.  K wires in place.  Radiographs demonstrate internal fixation appears to be in place though it appears that the head of the first and second metatarsals and may have shifted slightly medially.  Assessment postop 1 week.  Plan: Greatly encouraged him today to stay off of this foot and keep the foot elevated.  I placed Silvadene cream and a dry sterile compressive dressing around the foot with compression encouraging him to stay off of it.  Placed him back in his cam walker follow-up with him in 1 week for evaluation.

## 2021-11-05 ENCOUNTER — Encounter: Payer: Medicare HMO | Admitting: Podiatry

## 2021-11-10 ENCOUNTER — Ambulatory Visit (INDEPENDENT_AMBULATORY_CARE_PROVIDER_SITE_OTHER): Payer: Medicare HMO | Admitting: Podiatry

## 2021-11-10 ENCOUNTER — Other Ambulatory Visit: Payer: Self-pay

## 2021-11-10 ENCOUNTER — Encounter: Payer: Self-pay | Admitting: Podiatry

## 2021-11-10 DIAGNOSIS — M2012 Hallux valgus (acquired), left foot: Secondary | ICD-10-CM | POA: Diagnosis not present

## 2021-11-10 DIAGNOSIS — Z9889 Other specified postprocedural states: Secondary | ICD-10-CM

## 2021-11-10 DIAGNOSIS — M21962 Unspecified acquired deformity of left lower leg: Secondary | ICD-10-CM

## 2021-11-10 DIAGNOSIS — M2042 Other hammer toe(s) (acquired), left foot: Secondary | ICD-10-CM

## 2021-11-10 NOTE — Progress Notes (Signed)
Presents today postop visit #2 date of surgery 10/27/2021 left foot Austin bunionectomy Akin osteotomy second met osteotomy with hammertoe repair and pinning.  He states that he has been on the foot a little bit longer particularly around dinnertime versus hanging down more.  He states that he thinks the bandages are getting sweaty because he can sweat in the middle of wintertime.  He denies fever chills nausea vomiting muscle aches pains calf pain back pain chest pain shortness of breath.  Objective: Vital signs are stable he is alert and oriented x3 pulses are intact left foot.  Dressing was removed which was wet but not bloody.  He states that this was from sweating.  He has maceration around this incision sites but none of them are open at this point.  K wires still intact and second toe.  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  Plan: Redressed today Betadine dressing and dressed a more lightly and put him in a Darco shoe so he will be as hot.  I will follow-up with him in a couple of weeks to make sure he is doing better.

## 2021-11-12 ENCOUNTER — Encounter: Payer: Medicare HMO | Admitting: Podiatry

## 2021-11-17 ENCOUNTER — Encounter: Payer: Self-pay | Admitting: Podiatry

## 2021-11-17 ENCOUNTER — Ambulatory Visit (INDEPENDENT_AMBULATORY_CARE_PROVIDER_SITE_OTHER): Payer: Medicare HMO | Admitting: Podiatry

## 2021-11-17 ENCOUNTER — Other Ambulatory Visit: Payer: Self-pay

## 2021-11-17 DIAGNOSIS — Z9889 Other specified postprocedural states: Secondary | ICD-10-CM

## 2021-11-17 DIAGNOSIS — M2012 Hallux valgus (acquired), left foot: Secondary | ICD-10-CM

## 2021-11-17 DIAGNOSIS — M2042 Other hammer toe(s) (acquired), left foot: Secondary | ICD-10-CM

## 2021-11-17 DIAGNOSIS — M21962 Unspecified acquired deformity of left lower leg: Secondary | ICD-10-CM

## 2021-11-17 NOTE — Progress Notes (Signed)
He presents today date of surgery 10/27/2021 left foot Austin bunionectomy Akin osteotomy second metatarsal osteotomy and hammertoe with pin second.  He states that he is trying to stay off of it that he may be up on it mobic around dinnertime a lot more than he should be.  Denies fever chills nausea run muscle aches pain states that he still seems to be bleeding.  He states that he still sweating considerably but not nearly as much as he was in the big boot.  He likes to short shoe much better.  Objective: Vital signs are stable he is alert and oriented x3 pulses are palpable.  Presto dressing wet when I removed it.  Christopher Lawrence demonstrates some skin breakdown to the distal most aspect of the incision overlying the bunion.  There is some fibrin deposition and some granulation tissue is present onset epithelization appears to be occurring but it is still draining a serosanguineous type drainage he has a lot of dried skin with some bleeding beneath that that has dried and is discolored.  K wire still in place.  Assessment: Superficial ulceration hallux left status post Austin bunion repair Akin osteotomy second met osteotomy hammertoe repair.  Plan: Discussed etiology pathology conservative versus surgical therapies first and foremost to watch for signs and symptoms of worsening infection which we discussed today he will keep this dry and clean and not wash it.  We debrided the area today and placed Iodosorb gel with a light dressing which I demonstrated to him how to do.  I provided him with a tube of Iodosorb gel and a dry sterile compressive dressing which time once again demonstrated how to put on.  I will follow-up with him in 1 week for reevaluation currently we are not on any antibiotics.

## 2021-11-19 ENCOUNTER — Encounter: Payer: Medicare HMO | Admitting: Podiatry

## 2021-11-24 ENCOUNTER — Other Ambulatory Visit: Payer: Self-pay

## 2021-11-24 ENCOUNTER — Ambulatory Visit (INDEPENDENT_AMBULATORY_CARE_PROVIDER_SITE_OTHER): Payer: Medicare HMO

## 2021-11-24 ENCOUNTER — Ambulatory Visit (INDEPENDENT_AMBULATORY_CARE_PROVIDER_SITE_OTHER): Payer: Medicare HMO | Admitting: Podiatry

## 2021-11-24 DIAGNOSIS — M2012 Hallux valgus (acquired), left foot: Secondary | ICD-10-CM

## 2021-11-25 NOTE — Progress Notes (Signed)
He presents today for follow-up visit, postop visit denies fever chills nausea vomiting muscle aches pains calf pain back pain chest pain shortness of breath.  He is status post Austin bunion repair second met osteotomy and hammertoe repair with pin left foot.  He continues to use his Iodosorb dressing on a daily basis with a wound to the dorsum of the hallux.  States that he limits the time up on his foot.  Objective: Vital signs are stable alert and oriented x3.  Pulses are palpable.  The foot does demonstrate considerable amount of dry plaque skin which I was able to debride a lot of today.  He still has an open wound overlying the proximal phalanx through the bunion incision.  But it is getting smaller and appears to be healing.  I was able to remove all of his sutures today.  Radiographs taken today demonstrate a fracture hands to the Akin osteotomy also demonstrates a fracture to the K wire that was placed through the toe that fracture likely in the office in the second metatarsal and the wire will most likely be retained there.  Assessment slowly healing foot with wound.  Plan: Redressed him today removed all the sutures initially and, continue to have him dress this on a daily basis follow-up with him in about 2 weeks

## 2021-12-08 ENCOUNTER — Encounter: Payer: Medicare HMO | Admitting: Podiatry

## 2021-12-10 ENCOUNTER — Other Ambulatory Visit: Payer: Self-pay

## 2021-12-10 ENCOUNTER — Encounter: Payer: Self-pay | Admitting: Podiatry

## 2021-12-10 ENCOUNTER — Ambulatory Visit (INDEPENDENT_AMBULATORY_CARE_PROVIDER_SITE_OTHER): Payer: Medicare HMO

## 2021-12-10 ENCOUNTER — Ambulatory Visit (INDEPENDENT_AMBULATORY_CARE_PROVIDER_SITE_OTHER): Payer: Medicare HMO | Admitting: Podiatry

## 2021-12-10 ENCOUNTER — Other Ambulatory Visit: Payer: Self-pay | Admitting: Podiatry

## 2021-12-10 DIAGNOSIS — Z9889 Other specified postprocedural states: Secondary | ICD-10-CM

## 2021-12-10 DIAGNOSIS — M21962 Unspecified acquired deformity of left lower leg: Secondary | ICD-10-CM

## 2021-12-10 DIAGNOSIS — M2012 Hallux valgus (acquired), left foot: Secondary | ICD-10-CM

## 2021-12-10 DIAGNOSIS — M2042 Other hammer toe(s) (acquired), left foot: Secondary | ICD-10-CM

## 2021-12-15 NOTE — Progress Notes (Signed)
°  Subjective:  Patient ID: Christopher Lawrence, male    DOB: 10-09-1956,  MRN: [id number]  Chief Complaint  Patient presents with   Routine Post Op    POV #4 DOS 10/27/2021 LT FOOT AUSTIN BUNIONECTOMY, AIKEN OSTEOTOMY, 2ND METATARSAL OSTEOTOMY, HAMMERTOE 2ND    "There is really no pain, just pins and needles. The wounds look better, been dressing those daily still"    66 y.o. male returns for post-op check.  He is doing well not having much pain  Review of Systems: Negative except as noted in the HPI. Denies N/V/F/Ch.   Objective:  There were no vitals filed for this visit. There is no height or weight on file to calculate BMI. Constitutional Well developed. Well nourished.  Vascular Foot warm and well perfused. Capillary refill normal to all digits.  Calf is soft and supple, no posterior calf or knee pain, negative Homans' sign  Neurologic Normal speech. Oriented to person, place, and time. Epicritic sensation to light touch grossly present bilaterally.  Dermatologic Skin healing well without signs of infection. Skin edges well coapted without signs of infection.  Orthopedic: Tenderness to palpation noted about the surgical site.   Multiple view plain film radiographs: Good healing across the arthrodesis site, hardware intact and in position for bunionectomy, broken Kirschner wire proximal fragment is buried and metatarsal head, similar appearance to previous radiographs Assessment:   1. Hav (hallux abducto valgus), left   2. Hammer toe of left foot   3. Deformity of metatarsal bone of left foot   4. Status post left foot surgery    Plan:  Patient was evaluated and treated and all questions answered.  S/p foot surgery left -Progressing as expected post-operatively. -Kirschner wire was removed uneventfully.  The broken piece that is proximal is retained in the metatarsal head.  I took a post removal radiograph to confirm that had not changed position.  I reviewed this with the patient  and we discussed that it is buried in bone and I do not expect this will give him any problems at any point -He will return to see Dr. Milinda Pointer as scheduled  Return in 2 weeks (on 12/24/2021) for POV w/ xray.

## 2021-12-22 ENCOUNTER — Encounter: Payer: Medicare HMO | Admitting: Podiatry

## 2021-12-24 ENCOUNTER — Other Ambulatory Visit: Payer: Self-pay

## 2021-12-24 ENCOUNTER — Ambulatory Visit (INDEPENDENT_AMBULATORY_CARE_PROVIDER_SITE_OTHER): Payer: Medicare HMO | Admitting: Podiatry

## 2021-12-24 ENCOUNTER — Encounter: Payer: Self-pay | Admitting: Podiatry

## 2021-12-24 ENCOUNTER — Ambulatory Visit (INDEPENDENT_AMBULATORY_CARE_PROVIDER_SITE_OTHER): Payer: Medicare HMO

## 2021-12-24 DIAGNOSIS — M2042 Other hammer toe(s) (acquired), left foot: Secondary | ICD-10-CM

## 2021-12-24 DIAGNOSIS — M2012 Hallux valgus (acquired), left foot: Secondary | ICD-10-CM

## 2021-12-24 DIAGNOSIS — Z9889 Other specified postprocedural states: Secondary | ICD-10-CM

## 2021-12-24 DIAGNOSIS — M21962 Unspecified acquired deformity of left lower leg: Secondary | ICD-10-CM | POA: Diagnosis not present

## 2021-12-26 NOTE — Progress Notes (Signed)
He presents today for postop visit date of surgery is 10/27/2021 status post Liane Comber bunionectomy Akin osteotomy and a second metatarsal osteotomy with hammertoe repair and K wire.  He states that the foot feels fine the wound is still open with some yellow drainage.  He denies smelling any odor denies fever chills nausea vomiting muscle aches and pains.  Objective: Vital signs are stable he is alert and oriented x3 the foot all in all looks okay still moderately swollen still consistent with hyperhidrosis he still has the wound overlying the proximal phalanx of the hallux measuring 1.5 cm in length by 0.6 cm in width and 0.6 cm in depth.  Does not probe deep to tendon there is no tendon visible there is some fibrin deposition present there is some granulation tissue below that area.  Radiographs taken today demonstrate a well-healing osteotomies first and second metatarsals there is a small piece of K wire of the proximal end of the K wire that is still present in the head of the metatarsal which should be fine should not Pose any problems at all the toe is sitting rectus and looks like it is in very good position.  As are the other internal fixations of the first metatarsal and the proximal phalanx.  There does not appear to be any infection periosteal fluffing or destruction of the proximal phalanx.  Assessment well-healing surgical foot with delayed union of a dehiscence overlying the proximal phalanx.  Plan: Discussed etiology pathology conservative versus surgical therapies at this point I would like him to see Dr. Sherryle Lis for possible graft to assist in healing of this wound.  He will continue the use of his daily dressing regimen Iodosorb gel.

## 2022-01-11 ENCOUNTER — Other Ambulatory Visit: Payer: Self-pay

## 2022-01-11 ENCOUNTER — Ambulatory Visit (INDEPENDENT_AMBULATORY_CARE_PROVIDER_SITE_OTHER): Payer: Medicare HMO | Admitting: Podiatry

## 2022-01-11 DIAGNOSIS — L97522 Non-pressure chronic ulcer of other part of left foot with fat layer exposed: Secondary | ICD-10-CM | POA: Diagnosis not present

## 2022-01-11 MED ORDER — SANTYL 250 UNIT/GM EX OINT
1.0000 | TOPICAL_OINTMENT | Freq: Every day | CUTANEOUS | 30 refills | Status: DC
Start: 2022-01-11 — End: 2022-10-14

## 2022-01-17 ENCOUNTER — Encounter: Payer: Self-pay | Admitting: Podiatry

## 2022-01-17 NOTE — Progress Notes (Signed)
?  Subjective:  ?Patient ID: Christopher Lawrence, male    DOB: [birth year],  MRN: [id number] ? ?Chief Complaint  ?Patient presents with  ? Foot Ulcer  ?   POV #6 DOS 10/27/2021 LT FOOT AUSTIN BUNIONECTOMY, AIKEN OSTEOTOMY, 2ND METATARSAL OSTEOTOMY, HAMMERTOE 2ND  ? ? ?67 y.o. male presents with the above complaint. History confirmed with patient.  He is referred to me by Dr. Milinda Pointer for ongoing wound care of his left foot he has been using Neosporin and using Iodosorb ? ?Objective:  ?Physical Exam: ?warm, good capillary refill, no trophic changes or ulcerative lesions, normal DP and PT pulses, normal sensory exam, and ulceration left dorsal hallux measures 1.2 cm x 0.5 cm x 0.3 cm with fibrogranular wound bed ? ? ? ?Assessment:  ? ?1. Ulcer of left foot with fat layer exposed ([place])   ? ? ? ?Plan:  ?Patient was evaluated and treated and all questions answered. ? ?Ulcer left hallux ?-We discussed the etiology and factors that are a part of the wound healing process.  We also discussed the risk of infection both soft tissue and osteomyelitis from open ulceration.  Discussed the risk of limb loss if this happens or worsens. ?-Debridement as below. ?-Dressed with Iodosorb, DSD. ?-Continue home dressing changes daily with saline moistened gauze and Santyl ointment which I prescribed.  This was described to reduce the amount of bioburden and fibrous tissue that was present with enzymatic debridement, I think this is negatively impacting his wound healing at this point ?-If the wound bed improves more, do not see progress of wound closure then may consider advanced tissue substitute applications in the office ? ?Procedure: Excisional Debridement of Wound ?Rationale: Removal of non-viable soft tissue from the wound to promote healing.  ?Anesthesia: none ?Post-Debridement Wound Measurements: 1.2 cm x 0.6 cm x 0.3 cm  ?Type of Debridement: Sharp Excisional ?Tissue Removed: Non-viable soft tissue ?Depth of Debridement: subcutaneous  tissue. ?Technique: Sharp excisional debridement to bleeding, viable wound base.  ?Dressing: Dry, sterile, compression dressing. ?Disposition: Patient tolerated procedure well.  ? ? ? ?   ? ? ?Return in about 2 weeks (around 01/25/2022) for wound care.  ? ?

## 2022-01-25 ENCOUNTER — Other Ambulatory Visit: Payer: Self-pay

## 2022-01-25 ENCOUNTER — Ambulatory Visit (INDEPENDENT_AMBULATORY_CARE_PROVIDER_SITE_OTHER): Payer: Medicare HMO | Admitting: Podiatry

## 2022-01-25 DIAGNOSIS — L97522 Non-pressure chronic ulcer of other part of left foot with fat layer exposed: Secondary | ICD-10-CM

## 2022-01-26 NOTE — Progress Notes (Signed)
?  Subjective:  ?Patient ID: Christopher Lawrence, male    DOB: 28-Dec-1955,  MRN: [id number] ? ?Chief Complaint  ?Patient presents with  ? Foot Ulcer  ?    WOUND CHECK    DOS 10/27/2021 LT FOOT AUSTIN BUNIONECTOMY, AIKEN OSTEOTOMY, 2ND METATARSAL OSTEOTOMY, HAMMERTOE 2ND  ? ? ?66 y.o. male presents with the above complaint. History confirmed with patient.  He is referred to me by Dr. Milinda Pointer for ongoing wound care of his left foot he has been using the Santyl ointment since last visit and is improving ? ?Objective:  ?Physical Exam: ?warm, good capillary refill, no trophic changes or ulcerative lesions, normal DP and PT pulses, normal sensory exam, and ulceration left dorsal hallux measures 0.6 x 0.2 x 0.2 cm with fibrogranular wound bed, significant improvement ? ? ? ? ?Assessment:  ? ?1. Ulcer of left foot with fat layer exposed ([place])   ? ? ? ?Plan:  ?Patient was evaluated and treated and all questions answered. ? ?Ulcer left hallux ?-We discussed the etiology and factors that are a part of the wound healing process.  We also discussed the risk of infection both soft tissue and osteomyelitis from open ulceration.  Discussed the risk of limb loss if this happens or worsens. ?-Debridement as below. ?-Dressed with Iodosorb, DSD. ?-Continue home dressing changes daily with saline moistened gauze and Santyl ointment which I prescribed.   ? ?Procedure: Excisional Debridement of Wound ?Rationale: Removal of non-viable soft tissue from the wound to promote healing.  ?Anesthesia: none ?Post-Debridement Wound Measurements: 0.6 x 0.2 x 0.2 ?Type of Debridement: Sharp Excisional ?Tissue Removed: Non-viable soft tissue ?Depth of Debridement: subcutaneous tissue. ?Technique: Sharp excisional debridement to bleeding, viable wound base.  ?Dressing: Dry, sterile, compression dressing. ?Disposition: Patient tolerated procedure well.  ? ? ? ?   ? ? ?Return in about 4 weeks (around 02/22/2022) for wound care.  ? ?

## 2022-02-16 DIAGNOSIS — H524 Presbyopia: Secondary | ICD-10-CM | POA: Diagnosis not present

## 2022-02-22 ENCOUNTER — Ambulatory Visit: Payer: Medicare HMO | Admitting: Podiatry

## 2022-02-22 DIAGNOSIS — L97522 Non-pressure chronic ulcer of other part of left foot with fat layer exposed: Secondary | ICD-10-CM | POA: Diagnosis not present

## 2022-02-23 NOTE — Progress Notes (Signed)
?  Subjective:  ?Patient ID: GLENDON DUNWOODY, male    DOB: 1956/04/22,  MRN: [id number] ? ?Chief Complaint  ?Patient presents with  ? Foot Ulcer  ?  4 week follow up left foot  ? ? ?66 y.o. male presents with the above complaint. History confirmed with patient.  He is referred to me by Dr. Milinda Pointer for ongoing wound care of his left foot he has been using the Santyl ointment since last visit and is improving ? ?Objective:  ?Physical Exam: ?warm, good capillary refill, no trophic changes or ulcerative lesions, normal DP and PT pulses, normal sensory exam, and ulceration left dorsal hallux measures 0.2 x 0.2 x 0.2 cm with fibrogranular wound bed, significant improvement ? ? ? ? ? ?Assessment:  ? ?1. Ulcer of left foot with fat layer exposed ([place])   ? ? ? ?Plan:  ?Patient was evaluated and treated and all questions answered. ? ?Ulcer left hallux ?-We discussed the etiology and factors that are a part of the wound healing process.  We also discussed the risk of infection both soft tissue and osteomyelitis from open ulceration.  Discussed the risk of limb loss if this happens or worsens. ?-Debridement as below. ?-Dressed with Iodosorb, DSD. ?-Continue home dressing changes daily with saline moistened gauze and Santyl ointment which I prescribed.   ? ?Procedure: Excisional Debridement of Wound ?Rationale: Removal of non-viable soft tissue from the wound to promote healing.  ?Anesthesia: none ?Post-Debridement Wound Measurements: 0.2 x 0.2 x 0.2 ?Type of Debridement: Sharp Excisional ?Tissue Removed: Non-viable soft tissue ?Depth of Debridement: subcutaneous tissue. ?Technique: Sharp excisional debridement to bleeding, viable wound base.  ?Dressing: Dry, sterile, compression dressing. ?Disposition: Patient tolerated procedure well.  ? ? ? ?   ? ? ?Return in about 4 weeks (around 03/22/2022) for wound care right foot.  ? ?

## 2022-03-22 ENCOUNTER — Ambulatory Visit: Payer: Medicare HMO | Admitting: Podiatry

## 2022-03-22 DIAGNOSIS — L97522 Non-pressure chronic ulcer of other part of left foot with fat layer exposed: Secondary | ICD-10-CM | POA: Diagnosis not present

## 2022-03-23 NOTE — Progress Notes (Signed)
  Subjective:  Patient ID: Christopher Lawrence, male    DOB: 1956/07/30,  MRN: [id number]  Chief Complaint  Patient presents with   Foot Ulcer      4wk wound care L FOOT    65 y.o. male presents with the above complaint. History confirmed with patient.  Says it is doing well.  Objective:  Physical Exam: warm, good capillary refill, no trophic changes or ulcerative lesions, normal DP and PT pulses, normal sensory exam, and left dorsal hallux incision has finally healed      Assessment:   1. Ulcer of left foot with fat layer exposed ([place])      Plan:  Patient was evaluated and treated and all questions answered.  Ulcer left hallux -Wound is healed at this point.  I recommended he leave it open to air he may discontinue ointments and dressing at this point.  He still having some tenderness in his surgical sites as well and hopefully this will improve with time.  I would like him to follow-up 1 more time with Dr. Milinda Pointer in 2 months for final follow-up.         Return in 2 months (on 05/22/2022), or if symptoms worsen or fail to improve, for f/u from surgery.

## 2022-05-20 ENCOUNTER — Ambulatory Visit: Payer: Medicare HMO | Admitting: Podiatry

## 2022-05-20 ENCOUNTER — Ambulatory Visit (INDEPENDENT_AMBULATORY_CARE_PROVIDER_SITE_OTHER): Payer: Medicare HMO

## 2022-05-20 ENCOUNTER — Encounter: Payer: Self-pay | Admitting: Podiatry

## 2022-05-20 DIAGNOSIS — M2042 Other hammer toe(s) (acquired), left foot: Secondary | ICD-10-CM | POA: Diagnosis not present

## 2022-05-20 DIAGNOSIS — Z9889 Other specified postprocedural states: Secondary | ICD-10-CM

## 2022-05-20 DIAGNOSIS — M2012 Hallux valgus (acquired), left foot: Secondary | ICD-10-CM

## 2022-05-20 DIAGNOSIS — L600 Ingrowing nail: Secondary | ICD-10-CM

## 2022-05-20 DIAGNOSIS — M21962 Unspecified acquired deformity of left lower leg: Secondary | ICD-10-CM

## 2022-05-20 MED ORDER — NEOMYCIN-POLYMYXIN-HC 1 % OT SOLN: OTIC | 1 refills | Status: DC

## 2022-05-20 NOTE — Progress Notes (Signed)
He presents today for follow-up of his Christopher Lawrence osteotomy and second met osteotomy with a hammertoe repair second digit left foot.  He states that he is doing well with this however the third toe seems to be popping up like the second toe did.  He would also like for me to take a look at an ingrown nail to the hallux left.  Objective: Vital signs are stable he is alert oriented x3 he has a slight increase in range of motion of the first metatarsal phalangeal joint.  His lateral border of the hallux left with sharply incurvated and has been infected as evident by abscess that appears to be resolving.  Radiographs taken today demonstrate well-healing osteotomies internal fixation is intact.  Assessment: Well-healing surgical foot ingrown nail fibular border hallux left.  He does have a mild new hammertoe deformity third digit left foot  Plan: Chemical matricectomy was performed today after local anesthetic was administered.  He was given both oral and written instructions for the care and soaking of the toe as well as a prescription of Cortisporin Otic to be applied twice daily after soaking.  I will follow-up with him in about 2 weeks we will reassess that toe as well this may need to go back to the operating room to correct.

## 2022-05-20 NOTE — Patient Instructions (Signed)

## 2022-06-03 ENCOUNTER — Ambulatory Visit: Payer: Medicare HMO | Admitting: Podiatry

## 2022-06-03 DIAGNOSIS — L03032 Cellulitis of left toe: Secondary | ICD-10-CM

## 2022-06-05 NOTE — Progress Notes (Signed)
He presents today states that he is doing much better as he refers to his matrixectomy of the left hallux.  He is also status post Austin bunionectomy hammertoe repair number 2-second metatarsal osteotomy.  He is also concerned about the third digit of the left foot.  Objective: Vital signs are stable he is alert oriented x3 there is mild erythema there is no edema no cellulitis drainage or odor he has good range of motion of the first metatarsal phalangeal joint though it is mildly stiff due to the scar tissue present.  His third toe is starting to sort of cocked up at the level of the metatarsal phalangeal joint and the PIPJ we did discuss performing a hammertoe repair at some point probably in the fall.  Assessment: Resolving matrixectomy wound well-healing Austin bunion repair.  Plan: At this point encouraged range of motion exercises encouraged him to continue to soak and we will follow-up with him in about 3 weeks we will do another set of x-rays on that left foot.

## 2022-06-24 ENCOUNTER — Encounter: Payer: Self-pay | Admitting: Podiatry

## 2022-06-24 ENCOUNTER — Ambulatory Visit: Payer: Medicare HMO | Admitting: Podiatry

## 2022-06-24 DIAGNOSIS — D2372 Other benign neoplasm of skin of left lower limb, including hip: Secondary | ICD-10-CM | POA: Diagnosis not present

## 2022-06-24 NOTE — Progress Notes (Signed)
He presents today chief complaint of plantar callus left foot.  States that it feels like it did prior to surgery just in a different place.  Objective: Vital signs are stable he is alert and oriented x3 he has a contracted third digit of the left foot.  He has a porokeratotic lesion Sub third metatarsal head of the left foot which is exquisitely tender on palpation there are no open lesions or wounds.  Assessment: Contracted third metatarsal phalangeal joint and painful benign skin lesion left foot.  Plan: Debrided benign skin lesion placed Salinocaine under occlusion to be left on 2 to 3 days before being removed and washed off thoroughly.  We will follow-up with him in 1 month at which time we will consent him for surgery and reevaluate the porokeratosis.

## 2022-07-06 ENCOUNTER — Telehealth: Payer: Self-pay

## 2022-07-06 NOTE — Patient Outreach (Signed)
  Care Coordination   07/06/2022 Name: ODAY RIDINGS MRN: [id number] DOB: October 22, 1956   Care Coordination Outreach Attempts:  An unsuccessful telephone outreach was attempted today to offer the patient information about available care coordination services as a benefit of their health plan.   Follow Up Plan:  Additional outreach attempts will be made to offer the patient care coordination information and services.   Encounter Outcome:  No Answer  Care Coordination Interventions Activated:  No   Care Coordination Interventions:  No, not indicated    Jone Baseman, RN, MSN [facility] Care Management Care Management Coordinator Direct Line [phone] Toll Free: [phone]  Fax: [phone]

## 2022-07-13 ENCOUNTER — Telehealth: Payer: Self-pay

## 2022-07-13 NOTE — Patient Outreach (Signed)
  Care Coordination   Initial Visit Note   07/13/2022 Name: Christopher Lawrence MRN: [id number] DOB: 06-25-1956  Christopher Lawrence is a 66 y.o. year old male who sees Christopher Pinto, MD for primary care. I spoke with  Christopher Lawrence by phone today.  What matters to the patients health and wellness today?  none    Goals Addressed             This Visit's Progress    COMPLETED: Care Coordination Activities-No follow up required       Care Coordination Interventions: Advised patient to schedule annual wellness visit. Patient visit scheduled in November.  Also Discussed COVID Vaccine- advised to go to healthcare.gov for further info on new vaccines.            SDOH assessments and interventions completed:  Yes     Care Coordination Interventions Activated:  Yes  Care Coordination Interventions:  Yes, provided   Follow up plan: No further intervention required.   Encounter Outcome:  Pt. Visit Completed   Christopher Baseman, RN, MSN Christopher Lawrence Care Lawrence Coordinator Direct Line [phone] Toll Free: [phone]  Fax: [phone]

## 2022-07-13 NOTE — Patient Instructions (Signed)
Visit Information  Thank you for taking time to visit with me today. Please don't hesitate to contact me if I can be of assistance to you.   Following are the goals we discussed today:   Goals Addressed             This Visit's Progress    COMPLETED: Care Coordination Activities-No follow up required       Care Coordination Interventions: Advised patient to schedule annual wellness visit. Patient visit scheduled in November.  Also Discussed COVID Vaccine- advised to go to healthcare.gov for further info on new vaccines.            If you are experiencing a Mental Health or [place] or need someone to talk to, please call the Suicide and Crisis Lifeline: 988   Patient verbalizes understanding of instructions and care plan provided today and agrees to view in [place]. Active MyChart status and patient understanding of how to access instructions and care plan via MyChart confirmed with patient.     No further follow up required: Patient declined further ongoing follow up.  Jone Baseman, RN, MSN [facility] Care Management Coordinator Direct Line [phone]

## 2022-07-27 ENCOUNTER — Encounter: Payer: Self-pay | Admitting: Podiatry

## 2022-07-27 ENCOUNTER — Ambulatory Visit: Payer: Medicare HMO | Admitting: Podiatry

## 2022-07-27 DIAGNOSIS — M21962 Unspecified acquired deformity of left lower leg: Secondary | ICD-10-CM

## 2022-07-27 DIAGNOSIS — M2042 Other hammer toe(s) (acquired), left foot: Secondary | ICD-10-CM | POA: Diagnosis not present

## 2022-07-27 DIAGNOSIS — D2372 Other benign neoplasm of skin of left lower limb, including hip: Secondary | ICD-10-CM

## 2022-07-28 NOTE — Progress Notes (Signed)
He presents today to discuss surgery once again.  He states that I guess I does need to go ahead and get this toe fixed so this pain underneath the foot will go away.  ROS: Denies fever chills nausea vomiting muscle aches pains calf pain back pain chest pain shortness of breath.  Denies changes in his past medical history medications allergies surgeries and social history.  Objective: Vital signs are stable alert oriented x3.  There is no erythema edema salines drainage odor pulses remain strong and palpable.  He has a cocked up hammertoe deformity to the third metatarsal phalangeal joint left foot.  Status post [facility] Clan second met osteotomy and hammertoe repair with pin and second digit all went on to heal well though we do have a flexor contraction of the toe and an extensor contraction at the metatarsal phalangeal joint.  This is also resulting in a reactive hyperkeratosis to the plantar aspect of the foot.  Radiographs were reviewed today indicating well-healed initial surgery slightly elongated third metatarsal with contracture of the second toe.  Assessment: Hammertoe deformity plantarflexed third metatarsal left foot mild flexor contracture deformity to toes #4 and #5 of the left foot.  Nonrigid.  Plan: Discussed etiology pathology conservative surgical therapies I debrided the reactive hyperkeratotic tissue for him today.  I also discussed in great detail today second metatarsal osteotomy hammertoe repair with K wire he understands this is amendable to it as well as flexor tenotomy's to toes #4 #5 of the same foot.  We did discuss possible postop complications which are similar to the initial surgery.  Which may consist of but not limited to postop pain bleeding swelling infection recurrence need for further surgery overcorrection under correction also digit loss limb loss of life.  He will use the cam boot that he has at home and I will follow-up with him in the near future for surgical  intervention.

## 2022-08-12 ENCOUNTER — Telehealth: Payer: Self-pay | Admitting: Urology

## 2022-08-12 NOTE — Telephone Encounter (Signed)
DOS - 09/10/22  METATARSAL OSTEOTOMY 3RD LEFT --- [id number] HAMMERTOE REPAIR 3RD LEFT --- [id number] TENOTOMY 4,5 LEFT --- [id number]  [employer] EFFECTIVE DATE - 06/01/21  SPOKE WITH AYA S. WITH [employer] AND SHE STATED THAT FOR CPT CODES 69249, 32419 AND 91444 NO PRIOR AUTH IS REQUIRED.   REF # [id number]

## 2022-09-01 ENCOUNTER — Encounter: Payer: Self-pay | Admitting: Internal Medicine

## 2022-09-01 NOTE — Progress Notes (Incomplete)
Annual  Screening/Preventative Visit  & Comprehensive Evaluation & Examination   Future Appointments  Date Time Provider Department  09/02/2022                           2:00 PM Unk Pinto, MD [facility]  04/22/2023                       wellness 11:00 AM Alycia Rossetti, NP [facility]              This very nice 66 y.o. MWM presents for a Screening /Preventative Visit & comprehensive evaluation and management of multiple medical co-morbidities.  Patient has been followed for labile HTN, HLD, GERD, Prediabetes and Vitamin D Deficiency.       Labile HTN predates since  2003. Patient's BP has been controlled at home.  Today's BP is at goal -                      . Patient denies any cardiac symptoms as chest pain, palpitations, shortness of breath, dizziness or ankle swelling.       Patient's hyperlipidemia is controlled with diet . Last lipids were at goal :  Lab Results  Component Value Date   CHOL 149 09/02/2021   HDL 60 09/02/2021   LDLCALC 69 09/02/2021   TRIG 118 09/02/2021   CHOLHDL 2.5 09/02/2021         Patient is monitored expectantly for glucose intolerance  and patient denies reactive hypoglycemic symptoms, visual blurring, diabetic polys or paresthesias. Last A1c was normal & at goal :   Lab Results  Component Value Date   HGBA1C 5.1 09/02/2021         Finally, patient has history of Vitamin D Deficiency ("44"/2008) and last vitamin D was at goal :   Lab Results  Component Value Date   VD25OH 70 09/02/2021       Current Outpatient Medications  Medication Instructions  . BIOTIN    . Calcium  600  mg 2 tablets Daily  . VITAMIN D   2,000 Units,  2 times daily  . clobetasol ointment (TEMOVATE) 0.05 % APPLY TO AFFECTED AREA TWICE A DAY  . collagenase (SANTYL) 250 UNIT/GM ointment 1 application , Topical, Daily, Apply nickel thickness to ulcer daily with saline moistened and then dry gauze. Measurements 1.2 x 0.5 cm  . EPI-PEN 0.3 mg  .  fexofenadine 180 mg, Oral, Daily  . FLONASE  nasal spray 2 SPRAYS EACH NOSTRIL EVERY DAY  . Magnesium 500 MG TABS 1 tablet  Daily  . Multiple Vitamins-Minerals  1 tablet Daily  . CORTISPORIN  1 % SOLN OTIC Apply 1-2 drops to toe BID after soaking  . ondansetron    4 mg                               Every 8 hours PRN  . Saw Palmetto  2 tabs daily   . PFIZER COVID-19 VAC -  BIVALENT recorded.  . tadalafil  20 MG tablet Take 1/2 to 1 tablet every 2 to 3 days as needed for XXXX  . zinc   50 mg , Oral, Daily     Allergies  Allergen Reactions  . Wasp Venom Anaphylaxis    Wasp/Hornet/Bee  . Androgel [Testosterone] Other (See Comments)    Headaches  Past Medical History:  Diagnosis Date  . Allergy   . GERD (gastroesophageal reflux disease)   . Hx of adenomatous polyp of colon 10/27/2016  . Labile hypertension   . Other testicular hypofunction   . Psoriasis      Health Maintenance  Topic Date Due  . Zoster Vaccines- Shingrix (1 of 2) Never done  . Pneumonia Vaccine 25+ Years old (2 - PCV) 11/01/2009  . COVID-19 Vaccine (3 - Pfizer risk series) 03/17/2020  . INFLUENZA VACCINE  06/01/2021  . COLONOSCOPY (Pts 45-50yr Insurance coverage will need to be confirmed)  10/28/2024  . TETANUS/TDAP  06/21/2027  . Hepatitis C Screening  Completed  . HIV Screening  Completed  . HPV VACCINES  Aged Out     Immunization History  Administered Date(s) Administered  . Influenza Inj Mdck Quad 08/23/2018, 09/03/2019, 08/27/2020  . Influenza Split 07/19/2013, 07/29/2014, 07/31/2015  . Influenza,inj,quad  08/26/2016  . PFIZER SARS-COV-2 Vacc 01/22/2020, 02/18/2020  . PPD Test 07/11/2018, 07/30/2019, 08/27/2020  . Pneumococcal 23 11/01/2008  . Td 11/01/2006, 06/20/2017  . Zoster, Live 08/26/2016    Last Colon - 10/29/2019 - Dr GCarlean Purl- Recc 5 y f/u - due Dec 2025   Past Surgical History:  Procedure Laterality Date  . COLONOSCOPY  10/2016  . COLONOSCOPY N/A 10/2016  . HEMORRHOID  BANDING  04/2011  . HERNIA REPAIR Right 1958  . INSERTION OF MESH N/A 08/30/2014   Procedure: INSERTION OF MESH;  Surgeon: SMichael Boston MD;  [facility] [facility]  Service: General;  Laterality: N/A;  . LAPAROSCOPIC INGUINAL HERNIA WITH UMBILICAL HERNIA N/A 137/90/[id number]  Procedure: LAPAROSCOPIC EXPLORATION AND REPAIR OF BILATERAL HERNIAS IN  GROINS AND UMBILICAL HERNIA REPAIR.;  Surgeon: SMichael Boston MD;  Location: [facility];  Service: General;  Laterality: N/A;  . POLYPECTOMY       Family History  Problem Relation Age of Onset  . Hypertension Mother   . Colon polyps Mother   . Cancer Mother        Uterine  . Uterine cancer Mother   . COPD Father   . Arthritis Father   . Cancer Father        Prostate  . Prostate cancer Father   . Colon cancer Neg Hx   . Esophageal cancer Neg Hx   . Rectal cancer Neg Hx   . Stomach cancer Neg Hx      Social History   Married - Spouse name -Antony Haste No children Tobacco Use  . Smoking status: Former    Types: Cigarettes    Quit date: 09/28/1996    Years since quitting: 24.9  . Smokeless tobacco: Never  Substance Use Topics  . Alcohol use: Yes    Alcohol/week: 14.0 standard drinks    Types: 14 Glasses of wine per week    Comment: daily  wine  . Drug use: No      ROS Constitutional: Denies fever, chills, weight loss/gain, headaches, insomnia,  night sweats or change in appetite. Does c/o fatigue. Eyes: Denies redness, blurred vision, diplopia, discharge, itchy or watery eyes.  ENT: Denies discharge, congestion, post nasal drip, epistaxis, sore throat, earache, hearing loss, dental pain, Tinnitus, Vertigo, Sinus pain or snoring.  Cardio: Denies chest pain, palpitations, irregular heartbeat, syncope, dyspnea, diaphoresis, orthopnea, PND, claudication or edema Respiratory: denies cough, dyspnea, DOE, pleurisy, hoarseness, laryngitis or wheezing.  Gastrointestinal: Denies dysphagia, heartburn, reflux, water brash, pain, cramps, nausea, vomiting,  bloating, diarrhea, constipation, hematemesis, melena, hematochezia, jaundice or hemorrhoids Genitourinary:  Denies dysuria, frequency, urgency, nocturia, hesitancy, discharge, hematuria or flank pain Musculoskeletal: Denies arthralgia, myalgia, stiffness, Jt. Swelling, pain, limp or strain/sprain. Denies Falls. Skin: Denies puritis, rash, hives, warts, acne, eczema or change in skin lesion Neuro: No weakness, tremor, incoordination, spasms, paresthesia or pain Psychiatric: Denies confusion, memory loss or sensory loss. Denies Depression. Endocrine: Denies change in weight, skin, hair change, nocturia, and paresthesia, diabetic polys, visual blurring or hyper / hypo glycemic episodes.  Heme/Lymph: No excessive bleeding, bruising or enlarged lymph nodes.   Physical Exam  There were no vitals taken for this visit.  General Appearance: Well nourished and well groomed and in no apparent distress.  Eyes: PERRLA, EOMs, conjunctiva no swelling or erythema, normal fundi and vessels. Sinuses: No frontal/maxillary tenderness ENT/Mouth: EACs patent / TMs  nl. Nares clear without erythema, swelling, mucoid exudates. Oral hygiene is good. No erythema, swelling, or exudate. Tongue normal, non-obstructing. Tonsils not swollen or erythematous. Hearing normal.  Neck: Supple, thyroid not palpable. No bruits, nodes or JVD. Respiratory: Respiratory effort normal.  BS equal and clear bilateral without rales, rhonci, wheezing or stridor. Cardio: Heart sounds are normal with regular rate and rhythm and no murmurs, rubs or gallops. Peripheral pulses are normal and equal bilaterally without edema. No aortic or femoral bruits. Chest: symmetric with normal excursions and percussion.  Abdomen: Soft, with Nl bowel sounds. Nontender, no guarding, rebound, hernias, masses, or organomegaly.  Lymphatics: Non tender without lymphadenopathy.  Musculoskeletal: Full ROM all peripheral extremities, joint stability, 5/5 strength,  and normal gait. Skin: Warm and dry without rashes, lesions, cyanosis, clubbing or  ecchymosis.  Neuro: Cranial nerves intact, reflexes equal bilaterally. Normal muscle tone, no cerebellar symptoms. Sensation intact.  Pysch: Alert and oriented X 3 with normal affect, insight and judgment appropriate.   Assessment and Plan  1. Annual Preventative/Screening Exam    2. Labile hypertension  - EKG 12-Lead - [place], RETROPERITNL ABD,  LTD - Urinalysis, Routine w reflex microscopic - Microalbumin / creatinine urine ratio - CBC with Differential/Platelet - COMPLETE METABOLIC PANEL WITH GFR - Magnesium - TSH  3. Hyperlipidemia, mixed  - EKG 12-Lead - [place], RETROPERITNL ABD,  LTD - Lipid panel - TSH  4. Abnormal glucose  - EKG 12-Lead - [place], RETROPERITNL ABD,  LTD - Hemoglobin A1c - Insulin, random  5. Vitamin D deficiency  - VITAMIN D 25 Hydroxy   6. BPH with obstruction/lower urinary tract symptoms  - PSA  7. Screening for colorectal cancer  - POC Hemoccult Bld/Stl   8. Prostate cancer screening  - PSA  9. Screening for ischemic heart disease  - EKG 12-Lead  10. FH: hypertension  - EKG 12-Lead - [place], RETROPERITNL ABD,  LTD  11. Former smoker  - EKG 12-Lead - [place], RETROPERITNL ABD,  LTD  12. Screening for AAA (aortic abdominal aneurysm)  - [place], RETROPERITNL ABD,  LTD  13. Medication management - Urinalysis, Routine w reflex microscopic - Microalbumin / creatinine urine ratio - CBC with Differential/Platelet - COMPLETE METABOLIC PANEL WITH GFR - Magnesium - Lipid panel - TSH - Hemoglobin A1c - Insulin, random - VITAMIN D 25 Hydroxy          Patient was counseled in prudent diet, weight control to achieve/maintain BMI less than 25, BP monitoring, regular exercise and medications as discussed.  Discussed med effects and SE's. Routine screening labs and tests as requested with regular follow-up as recommended. Over 40 minutes of exam, counseling, chart  review and high complex critical decision  making was performed   Kirtland Bouchard, MD

## 2022-09-01 NOTE — Progress Notes (Signed)
Annual  Screening/Preventative Visit  & Comprehensive Evaluation & Examination   Future Appointments  Date Time Provider Department  09/02/2022                           2:00 PM Unk Pinto, MD [facility]  04/22/2023                       wellness 11:00 AM Alycia Rossetti, NP [facility]              This very nice 66 y.o. MWM presents for a Screening /Preventative Visit & comprehensive evaluation and management of multiple medical co-morbidities.  Patient has been followed for labile HTN, HLD, GERD, Prediabetes and Vitamin D Deficiency.       Labile HTN predates since  2003. Patient's BP has been controlled at home.  Today's BP is at goal - 128/76 . Patient denies any cardiac symptoms as chest pain, palpitations, shortness of breath, dizziness or ankle swelling.       Patient's hyperlipidemia is controlled with diet . Last lipids were at goal :  Lab Results  Component Value Date   CHOL 149 09/02/2021   HDL 60 09/02/2021   LDLCALC 69 09/02/2021   TRIG 118 09/02/2021   CHOLHDL 2.5 09/02/2021         Patient is monitored expectantly for glucose intolerance  and patient denies reactive hypoglycemic symptoms, visual blurring, diabetic polys or paresthesias. Last A1c was normal & at goal :   Lab Results  Component Value Date   HGBA1C 5.1 09/02/2021         Finally, patient has history of Vitamin D Deficiency ("44"/2008) and last vitamin D was at goal :   Lab Results  Component Value Date   VD25OH 91 09/02/2021       Current Outpatient Medications  Medication Instructions   BIOTIN     Calcium  600  mg 2 tablets Daily   VITAMIN D   2,000 Units,  2 times daily   clobetasol ointment (TEMOVATE) 0.05 % APPLY TO AFFECTED AREA TWICE A DAY   collagenase (SANTYL) 250 UNIT/GM ointment 1 application , Topical, Daily, Apply nickel thickness to ulcer daily with saline moistened and then dry gauze. Measurements 1.2 x 0.5 cm   EPI-PEN 0.3 mg   fexofenadine 180 mg, Oral,  Daily   FLONASE  nasal spray 2 SPRAYS EACH NOSTRIL EVERY DAY   Magnesium 500 MG TABS 1 tablet  Daily   Multiple Vitamins-Minerals  1 tablet Daily   CORTISPORIN  1 % SOLN OTIC Apply 1-2 drops to toe BID after soaking   ondansetron    4 mg                               Every 8 hours PRN   Saw Palmetto  2 tabs daily    PFIZER COVID-19 VAC -  BIVALENT recorded.   tadalafil  20 MG tablet Take 1/2 to 1 tablet every 2 to 3 days as needed for XXXX   zinc   50 mg , Oral, Daily     Allergies  Allergen Reactions   Wasp Venom Anaphylaxis    Wasp/Hornet/Bee   Androgel [Testosterone] Other (See Comments)    Headaches     Past Medical History:  Diagnosis Date   Allergy    GERD (gastroesophageal reflux disease)  Hx of adenomatous polyp of colon 10/27/2016   Labile hypertension    Other testicular hypofunction    Psoriasis      Health Maintenance  Topic Date Due   Zoster Vaccines- Shingrix (1 of 2) Never done   Pneumonia Vaccine 101+ Years old (2 - PCV) 11/01/2009   COVID-19 Vaccine (3 - Pfizer risk series) 03/17/2020   INFLUENZA VACCINE  06/01/2021   COLONOSCOPY (Pts 45-59yr Insurance coverage will need to be confirmed)  10/28/2024   TETANUS/TDAP  06/21/2027   Hepatitis C Screening  Completed   HIV Screening  Completed   HPV VACCINES  Aged Out     Immunization History  Administered Date(s) Administered   Influenza Inj Mdck Quad 08/23/2018, 09/03/2019, 08/27/2020   Influenza Split 07/19/2013, 07/29/2014, 07/31/2015   Influenza,inj,quad  08/26/2016   PFIZER SARS-COV-2 Vacc 01/22/2020, 02/18/2020   PPD Test 07/11/2018, 07/30/2019, 08/27/2020   Pneumococcal 23 11/01/2008   Td 11/01/2006, 06/20/2017   Zoster, Live 08/26/2016    Last Colon - 10/29/2019 - Dr GCarlean Purl- Recc 5 y f/u - due Dec 2025   Past Surgical History:  Procedure Laterality Date   COLONOSCOPY  10/2016   COLONOSCOPY N/A 10/2016   HEMORRHOID BANDING  04/2011   HERNIA REPAIR Right 1958   INSERTION OF MESH  N/A 08/30/2014   Procedure: INSERTION OF MESH;  Surgeon: SMichael Boston MD;  [facility]  Service: General;  Laterality: N/A;   LAPAROSCOPIC INGUINAL HERNIA WITH UMBILICAL HERNIA N/A 138/25/0539  Procedure: LAPAROSCOPIC EXPLORATION AND REPAIR OF BILATERAL HERNIAS IN  GROINS AND UMBILICAL HERNIA REPAIR.;  Surgeon: SMichael Boston MD;  Location[facility];  Service: General;  Laterality: N/A;   POLYPECTOMY       Family History  Problem Relation Age of Onset   Hypertension Mother    Colon polyps Mother    Cancer Mother        Uterine   Uterine cancer Mother    COPD Father    Arthritis Father    Cancer Father        Prostate   Prostate cancer Father    Colon cancer Neg Hx    Esophageal cancer Neg Hx    Rectal cancer Neg Hx    Stomach cancer Neg Hx      Social History   Married - Spouse name - AAntony Haste No children Tobacco Use   Smoking status: Former    Types: Cigarettes    Quit date: 09/28/1996    Years since quitting: 24.9   Smokeless tobacco: Never  Substance Use Topics   Alcohol use: Yes    Alcohol/week: 14.0 standard drinks    Types: 14 Glasses of wine per week    Comment: daily  wine   Drug use: No      ROS Constitutional: Denies fever, chills, weight loss/gain, headaches, insomnia,  night sweats or change in appetite. Does c/o fatigue. Eyes: Denies redness, blurred vision, diplopia, discharge, itchy or watery eyes.  ENT: Denies discharge, congestion, post nasal drip, epistaxis, sore throat, earache, hearing loss, dental pain, Tinnitus, Vertigo, Sinus pain or snoring.  Cardio: Denies chest pain, palpitations, irregular heartbeat, syncope, dyspnea, diaphoresis, orthopnea, PND, claudication or edema Respiratory: denies cough, dyspnea, DOE, pleurisy, hoarseness, laryngitis or wheezing.  Gastrointestinal: Denies dysphagia, heartburn, reflux, water brash, pain, cramps, nausea, vomiting, bloating, diarrhea, constipation, hematemesis, melena, hematochezia, jaundice or  hemorrhoids Genitourinary: Denies dysuria, frequency, urgency, nocturia, hesitancy, discharge, hematuria or flank pain Musculoskeletal: Denies arthralgia, myalgia, stiffness, Jt. Swelling, pain,  limp or strain/sprain. Denies Falls. Skin: Denies puritis, rash, hives, warts, acne, eczema or change in skin lesion Neuro: No weakness, tremor, incoordination, spasms, paresthesia or pain Psychiatric: Denies confusion, memory loss or sensory loss. Denies Depression. Endocrine: Denies change in weight, skin, hair change, nocturia, and paresthesia, diabetic polys, visual blurring or hyper / hypo glycemic episodes.  Heme/Lymph: No excessive bleeding, bruising or enlarged lymph nodes.   Physical Exam  BP 128/76   Pulse 67   Temp 98.2 F (36.8 C)   Resp 17   Ht 6' 1.5" (1.867 m)   Wt 204 lb 9.6 oz (92.8 kg)   SpO2 96%   BMI 26.63 kg/m   General Appearance: Well nourished and well groomed and in no apparent distress.  Eyes: PERRLA, EOMs, conjunctiva no swelling or erythema, normal fundi and vessels. Sinuses: No frontal/maxillary tenderness ENT/Mouth: EACs patent / TMs  nl. Nares clear without erythema, swelling, mucoid exudates. Oral hygiene is good. No erythema, swelling, or exudate. Tongue normal, non-obstructing. Tonsils not swollen or erythematous. Hearing normal.  Neck: Supple, thyroid not palpable. No bruits, nodes or JVD. Respiratory: Respiratory effort normal.  BS equal and clear bilateral without rales, rhonci, wheezing or stridor. Cardio: Heart sounds are normal with regular rate and rhythm and no murmurs, rubs or gallops. Peripheral pulses are normal and equal bilaterally without edema. No aortic or femoral bruits. Chest: symmetric with normal excursions and percussion.  Abdomen: Soft, with Nl bowel sounds. Nontender, no guarding, rebound, hernias, masses, or organomegaly.  Lymphatics: Non tender without lymphadenopathy.  Musculoskeletal: Full ROM all peripheral extremities, joint  stability, 5/5 strength, and normal gait. Skin: Warm and dry without rashes, lesions, cyanosis, clubbing or  ecchymosis.  Neuro: Cranial nerves intact, reflexes equal bilaterally. Normal muscle tone, no cerebellar symptoms. Sensation intact.  Pysch: Alert and oriented x 3 with normal affect, insight and judgment appropriate.   Assessment and Plan  1. Annual Preventative/Screening Exam    2. Labile hypertension  - EKG 12-Lead - [place], RETROPERITNL ABD,  LTD - Urinalysis, Routine w reflex microscopic - Microalbumin / creatinine urine ratio - CBC with Differential/Platelet - COMPLETE METABOLIC PANEL WITH GFR - Magnesium - TSH  3. Hyperlipidemia, mixed  - EKG 12-Lead - [place], RETROPERITNL ABD,  LTD - Lipid panel - TSH  4. Abnormal glucose  - EKG 12-Lead - [place], RETROPERITNL ABD,  LTD - Hemoglobin A1c - Insulin, random  5. Vitamin D deficiency  - VITAMIN D 25 Hydroxy (Vit-D Deficiency, Fractures)  6. BPH with obstruction/lower urinary tract symptoms  - PSA  7. Screening for colorectal cancer  - POC Hemoccult Bld/Stl (3-Cd Home Screen); Future  8. Prostate cancer screening  - PSA  9. Screening for heart disease  - EKG 12-Lead  10. FH: hypertension  - EKG 12-Lead - [place], RETROPERITNL ABD,  LTD  11. Former smoker  - EKG 12-Lead - [place], RETROPERITNL ABD,  LTD  12. Screening for AAA (aortic abdominal aneurysm)  - [place], RETROPERITNL ABD,  LTD  13. Medication management  - Urinalysis, Routine w reflex microscopic - Microalbumin / creatinine urine ratio - COMPLETE METABOLIC PANEL WITH GFR - Magnesium - Lipid panel - TSH - Hemoglobin A1c - Insulin, random - VITAMIN D 25 Hydroxy           Patient was counseled in prudent diet, weight control to achieve/maintain BMI less than 25, BP monitoring, regular exercise and medications as discussed.  Discussed med effects and SE's. Routine screening labs and tests as requested with  regular follow-up as recommended. Over 40  minutes of exam, counseling, chart review and high complex critical decision making was performed   Kirtland Bouchard, MD

## 2022-09-01 NOTE — Patient Instructions (Signed)

## 2022-09-02 ENCOUNTER — Ambulatory Visit (INDEPENDENT_AMBULATORY_CARE_PROVIDER_SITE_OTHER): Payer: Medicare HMO | Admitting: Internal Medicine

## 2022-09-02 ENCOUNTER — Encounter: Payer: Self-pay | Admitting: Internal Medicine

## 2022-09-02 VITALS — BP 128/76 | HR 67 | Temp 98.2°F | Resp 17 | Ht 73.5 in | Wt 204.6 lb

## 2022-09-02 DIAGNOSIS — E782 Mixed hyperlipidemia: Secondary | ICD-10-CM

## 2022-09-02 DIAGNOSIS — N138 Other obstructive and reflux uropathy: Secondary | ICD-10-CM

## 2022-09-02 DIAGNOSIS — Z131 Encounter for screening for diabetes mellitus: Secondary | ICD-10-CM

## 2022-09-02 DIAGNOSIS — N401 Enlarged prostate with lower urinary tract symptoms: Secondary | ICD-10-CM | POA: Diagnosis not present

## 2022-09-02 DIAGNOSIS — Z8249 Family history of ischemic heart disease and other diseases of the circulatory system: Secondary | ICD-10-CM

## 2022-09-02 DIAGNOSIS — Z23 Encounter for immunization: Secondary | ICD-10-CM

## 2022-09-02 DIAGNOSIS — Z136 Encounter for screening for cardiovascular disorders: Secondary | ICD-10-CM | POA: Diagnosis not present

## 2022-09-02 DIAGNOSIS — Z125 Encounter for screening for malignant neoplasm of prostate: Secondary | ICD-10-CM

## 2022-09-02 DIAGNOSIS — E559 Vitamin D deficiency, unspecified: Secondary | ICD-10-CM | POA: Diagnosis not present

## 2022-09-02 DIAGNOSIS — I700 Atherosclerosis of aorta: Secondary | ICD-10-CM

## 2022-09-02 DIAGNOSIS — R7309 Other abnormal glucose: Secondary | ICD-10-CM

## 2022-09-02 DIAGNOSIS — Z0000 Encounter for general adult medical examination without abnormal findings: Secondary | ICD-10-CM

## 2022-09-02 DIAGNOSIS — Z79899 Other long term (current) drug therapy: Secondary | ICD-10-CM

## 2022-09-02 DIAGNOSIS — R0989 Other specified symptoms and signs involving the circulatory and respiratory systems: Secondary | ICD-10-CM | POA: Diagnosis not present

## 2022-09-02 DIAGNOSIS — Z0001 Encounter for general adult medical examination with abnormal findings: Secondary | ICD-10-CM

## 2022-09-02 DIAGNOSIS — Z1211 Encounter for screening for malignant neoplasm of colon: Secondary | ICD-10-CM

## 2022-09-02 DIAGNOSIS — Z87891 Personal history of nicotine dependence: Secondary | ICD-10-CM

## 2022-09-03 LAB — MICROALBUMIN / CREATININE URINE RATIO
Creatinine, Urine: 95 mg/dL (ref 20–320)
Microalb Creat Ratio: 4 mcg/mg creat (ref <30)
Microalb, Ur: 0.4 mg/dL

## 2022-09-03 LAB — CBC WITH DIFFERENTIAL/PLATELET
Absolute Monocytes: 547 cells/uL (ref 200–950)
Basophils Absolute: 69 cells/uL (ref 0–200)
Basophils Relative: 0.9 %
Eosinophils Absolute: 323 cells/uL (ref 15–500)
Eosinophils Relative: 4.2 %
HCT: 42.8 % (ref 38.5–50.0)
Hemoglobin: 15.1 g/dL (ref 13.2–17.1)
Lymphs Abs: 2379 cells/uL (ref 850–3900)
MCH: 30.8 pg (ref 27.0–33.0)
MCHC: 35.3 g/dL (ref 32.0–36.0)
MCV: 87.3 fL (ref 80.0–100.0)
MPV: 10.8 fL (ref 7.5–12.5)
Monocytes Relative: 7.1 %
Neutro Abs: 4381 cells/uL (ref 1500–7800)
Neutrophils Relative %: 56.9 %
Platelets: 233 10*3/uL (ref 140–400)
RBC: 4.9 10*6/uL (ref 4.20–5.80)
RDW: 13.3 % (ref 11.0–15.0)
Total Lymphocyte: 30.9 %
WBC: 7.7 10*3/uL (ref 3.8–10.8)

## 2022-09-03 LAB — COMPLETE METABOLIC PANEL WITH GFR
AG Ratio: 2 (calc) (ref 1.0–2.5)
ALT: 36 U/L (ref 9–46)
AST: 20 U/L (ref 10–35)
Albumin: 4.5 g/dL (ref 3.6–5.1)
Alkaline phosphatase (APISO): 97 U/L (ref 35–144)
BUN: 20 mg/dL (ref 7–25)
CO2: 24 mmol/L (ref 20–32)
Calcium: 9.7 mg/dL (ref 8.6–10.3)
Chloride: 105 mmol/L (ref 98–110)
Creat: 0.71 mg/dL (ref 0.70–1.35)
Globulin: 2.2 g/dL (calc) (ref 1.9–3.7)
Glucose, Bld: 88 mg/dL (ref 65–99)
Potassium: 4.3 mmol/L (ref 3.5–5.3)
Sodium: 138 mmol/L (ref 135–146)
Total Bilirubin: 0.5 mg/dL (ref 0.2–1.2)
Total Protein: 6.7 g/dL (ref 6.1–8.1)
eGFR: 101 mL/min/{1.73_m2} (ref >=60)

## 2022-09-03 LAB — URINALYSIS, ROUTINE W REFLEX MICROSCOPIC
Bacteria, UA: NONE SEEN /HPF
Bilirubin Urine: NEGATIVE
Glucose, UA: NEGATIVE
Hgb urine dipstick: NEGATIVE
Hyaline Cast: NONE SEEN /LPF
Ketones, ur: NEGATIVE
Nitrite: NEGATIVE
Protein, ur: NEGATIVE
RBC / HPF: NONE SEEN /HPF (ref 0–2)
Specific Gravity, Urine: 1.02 (ref 1.001–1.035)
Squamous Epithelial / HPF: NONE SEEN /HPF (ref <=5)
WBC, UA: NONE SEEN /HPF (ref 0–5)
pH: <=5 (ref 5.0–8.0)

## 2022-09-03 LAB — LIPID PANEL
Cholesterol: 154 mg/dL (ref <200)
HDL: 63 mg/dL (ref >=40)
LDL Cholesterol (Calc): 72 mg/dL (calc)
Non-HDL Cholesterol (Calc): 91 mg/dL (calc) (ref <130)
Total CHOL/HDL Ratio: 2.4 (calc) (ref <5.0)
Triglycerides: 103 mg/dL (ref <150)

## 2022-09-03 LAB — PSA: PSA: 2.31 ng/mL (ref <=4.00)

## 2022-09-03 LAB — HEMOGLOBIN A1C
Hgb A1c MFr Bld: 5.4 % of total Hgb (ref <5.7)
Mean Plasma Glucose: 108 mg/dL
eAG (mmol/L): 6 mmol/L

## 2022-09-03 LAB — INSULIN, RANDOM: Insulin: 6.9 u[IU]/mL

## 2022-09-03 LAB — MICROSCOPIC MESSAGE

## 2022-09-03 LAB — VITAMIN D 25 HYDROXY (VIT D DEFICIENCY, FRACTURES): Vit D, 25-Hydroxy: 71 ng/mL (ref 30–100)

## 2022-09-03 LAB — TSH: TSH: 3.66 mIU/L (ref 0.40–4.50)

## 2022-09-03 LAB — MAGNESIUM: Magnesium: 1.9 mg/dL (ref 1.5–2.5)

## 2022-09-03 NOTE — Progress Notes (Signed)
<><><><><><><><><><><><><><><><><><><><><><><><><><><><><><><><><> <><><><><><><><><><><><><><><><><><><><><><><><><><><><><><><><><> -   Test results slightly outside the reference range are not unusual. If there is anything important, I will review this with you,  otherwise it is considered normal test values.  If you have further questions,  please do not hesitate to contact me at the office or via My Chart.  <><><><><><><><><><><><><><><><><><><><><><><><><><><><><><><><><> <><><><><><><><><><><><><><><><><><><><><><><><><><><><><><><><><>  -  PSA - very Low - No Prostate Cancer  ! <><><><><><><><><><><><><><><><><><><><><><><><><><><><><><><><><>  -  Total  Chol =   154      - Excellent            (  Ideal  or  Goal is less than 180  !  )  & -  Bad / Dangerous LDL  Chol = 72  - also Excellent              (  Ideal  or  Goal is less than 70  !  )   Excellent   - Very low risk for Heart Attack  / Stroke <><><><><><><><><><><><><><><><><><><><><><><><><><><><><><><><><>  -  A1c - Normal  - No Diabetes  -  Great    ! <><><><><><><><><><><><><><><><><><><><><><><><><><><><><><><><><>  -  Vitamin D = 71 - Excellent   -   Please keep dose same  <><><><><><><><><><><><><><><><><><><><><><><><><><><><><><><><><>  -  All Else - CBC - Kidneys - Electrolytes - Liver - Magnesium & Thyroid    - all  Normal / OK <><><><><><><><><><><><><><><><><><><><><><><><><><><><><><><><><> <><><><><><><><><><><><><><><><><><><><><><><><><><><><><><><><><>  -  Keep up the Great Work    !   <><><><><><><><><><><><><><><><><><><><><><><><><><><><><><><><><> <><><><><><><><><><><><><><><><><><><><><><><><><><><><><><><><><>

## 2022-09-05 ENCOUNTER — Encounter: Payer: Self-pay | Admitting: Internal Medicine

## 2022-09-08 ENCOUNTER — Other Ambulatory Visit: Payer: Self-pay | Admitting: Podiatry

## 2022-09-08 MED ORDER — ONDANSETRON HCL 4 MG PO TABS: 4.0000 mg | ORAL_TABLET | Freq: Three times a day (TID) | ORAL | 0 refills | Status: DC | PRN

## 2022-09-08 MED ORDER — OXYCODONE-ACETAMINOPHEN 10-325 MG PO TABS: 1.0000 | ORAL_TABLET | Freq: Three times a day (TID) | ORAL | 0 refills | Status: AC | PRN

## 2022-09-08 MED ORDER — CEPHALEXIN 500 MG PO CAPS: 500.0000 mg | ORAL_CAPSULE | Freq: Three times a day (TID) | ORAL | 0 refills | Status: DC

## 2022-09-13 DIAGNOSIS — G8918 Other acute postprocedural pain: Secondary | ICD-10-CM | POA: Diagnosis not present

## 2022-09-13 DIAGNOSIS — M21542 Acquired clubfoot, left foot: Secondary | ICD-10-CM | POA: Diagnosis not present

## 2022-09-13 DIAGNOSIS — M2042 Other hammer toe(s) (acquired), left foot: Secondary | ICD-10-CM | POA: Diagnosis not present

## 2022-09-13 DIAGNOSIS — M205X2 Other deformities of toe(s) (acquired), left foot: Secondary | ICD-10-CM | POA: Diagnosis not present

## 2022-09-16 ENCOUNTER — Ambulatory Visit (INDEPENDENT_AMBULATORY_CARE_PROVIDER_SITE_OTHER): Payer: Medicare HMO | Admitting: Podiatry

## 2022-09-16 ENCOUNTER — Encounter: Payer: Self-pay | Admitting: Podiatry

## 2022-09-16 ENCOUNTER — Ambulatory Visit (INDEPENDENT_AMBULATORY_CARE_PROVIDER_SITE_OTHER): Payer: Medicare HMO

## 2022-09-16 DIAGNOSIS — Z9889 Other specified postprocedural states: Secondary | ICD-10-CM

## 2022-09-16 DIAGNOSIS — M2042 Other hammer toe(s) (acquired), left foot: Secondary | ICD-10-CM | POA: Diagnosis not present

## 2022-09-16 NOTE — Progress Notes (Signed)
Christopher Lawrence presents today for his first postop visit date of surgery September 13, 2022 third metatarsal osteotomy.  He also had flexor tenotomy's toes #4 #5 of the left foot.  States that is doing okay but pain medication makes me somewhat nauseated he also goes on to say that his mother died 06-Feb-2023 afternoon so has been on this for a lot more recently.  He denies fever chills nausea vomit muscle aches pains calf pain back pain chest pain shortness of breath.  Objective: Vital signs stable he is alert oriented x3 dry sterile dressing intact today once removed demonstrates no erythema cellulitis drainage or odor he does have a small amount of bleeding from the incision date of surgery which was performed on February 06, 2023..  Radiographs taken today demonstrate an osseously mature individual K wires retained to the third toe left foot as well as to Synthes screws to the metatarsal head.  Assessment: Well-healing surgical foot.  Plan: Redressed today dressed a compressive dressing encouraged to try to stay off it is much as possible I will follow-up with him in 2 weeks

## 2022-09-28 ENCOUNTER — Ambulatory Visit (INDEPENDENT_AMBULATORY_CARE_PROVIDER_SITE_OTHER): Payer: Medicare HMO | Admitting: Podiatry

## 2022-09-28 ENCOUNTER — Encounter: Payer: Self-pay | Admitting: Podiatry

## 2022-09-28 DIAGNOSIS — Z9889 Other specified postprocedural states: Secondary | ICD-10-CM

## 2022-09-28 DIAGNOSIS — M2042 Other hammer toe(s) (acquired), left foot: Secondary | ICD-10-CM

## 2022-09-28 NOTE — Progress Notes (Signed)
He presents today for his second postop visit date of surgery is 09/13/2022 third metatarsal osteotomy with hammertoe repair third left with K wire.  Also had flexor tenotomy to toes 4 and 5 on the left foot.  He denies fever chills nausea vomit muscle aches pains calf pain back pain chest pain shortness of breath.  Objective: Vital signs are stable alert and oriented x 3 dressed her dressing intact was removed demonstrates macerated tissue around the dorsum of the foot where he has been sweating.  Sutures are intact margins well coapted I did remove the sutures to the plantar aspect of the fourth and fifth digits of the left foot tolerated procedure well without complications I am going to leave the sutures into the dorsal aspect of the left foot and third toe primarily because of the maceration and skin breakdown that he had last time for his bunion surgery.  Assessment: Healed surgical foot.  Plan: Remove sutures today fourth and fifth toes left redressed the foot dressed a compressive dressing follow-up with him in 1 to 2 weeks for the remainder of the sutures to be removed.

## 2022-10-05 ENCOUNTER — Ambulatory Visit (INDEPENDENT_AMBULATORY_CARE_PROVIDER_SITE_OTHER): Payer: Medicare HMO | Admitting: Podiatry

## 2022-10-05 ENCOUNTER — Encounter: Payer: Medicare HMO | Admitting: Podiatry

## 2022-10-05 DIAGNOSIS — Z9889 Other specified postprocedural states: Secondary | ICD-10-CM | POA: Diagnosis not present

## 2022-10-05 DIAGNOSIS — M2042 Other hammer toe(s) (acquired), left foot: Secondary | ICD-10-CM

## 2022-10-05 NOTE — Progress Notes (Signed)
Patient presents today for post op visit # 3, patient of Dr. Milinda Pointer.   POV # 3 DOS 09/13/22 third metatarsal osteotomy with hammertoe repair third left with k wire    He presents in his walking boot weightbearing. Denies any falls or injury to the foot. Foot is swollen. No signs of infection. No calf pain or shortness of breath. Bandages dry and intact. Incision is intact. Sutures were removed from 3 digit left without complication. He tolerated suture removal well.         Foot redressed today and placed his back in the boot. Reviewed icing and elevation. He will follow up with Dr. Milinda Pointer next week for POV# 4 pin removal.

## 2022-10-14 ENCOUNTER — Ambulatory Visit (INDEPENDENT_AMBULATORY_CARE_PROVIDER_SITE_OTHER): Payer: Medicare HMO

## 2022-10-14 ENCOUNTER — Encounter: Payer: Self-pay | Admitting: Podiatry

## 2022-10-14 ENCOUNTER — Ambulatory Visit (INDEPENDENT_AMBULATORY_CARE_PROVIDER_SITE_OTHER): Payer: Medicare HMO | Admitting: Podiatry

## 2022-10-14 DIAGNOSIS — Z9889 Other specified postprocedural states: Secondary | ICD-10-CM

## 2022-10-14 DIAGNOSIS — M2042 Other hammer toe(s) (acquired), left foot: Secondary | ICD-10-CM | POA: Diagnosis not present

## 2022-10-15 ENCOUNTER — Other Ambulatory Visit: Payer: Self-pay

## 2022-10-15 DIAGNOSIS — Z1211 Encounter for screening for malignant neoplasm of colon: Secondary | ICD-10-CM

## 2022-10-15 LAB — POC HEMOCCULT BLD/STL (HOME/3-CARD/SCREEN)
Card #2 Fecal Occult Blod, POC: NEGATIVE
Card #3 Fecal Occult Blood, POC: NEGATIVE
Fecal Occult Blood, POC: NEGATIVE

## 2022-10-17 NOTE — Progress Notes (Signed)
Mr. Ewing presents today date of surgery 09/13/2022 third metatarsal osteotomy hammertoe repair third digit left foot flexor tenotomy's 4 and 5 of the left foot.  States is doing good just still hurts on the bottom a little bit as he refers to the benign skin lesions.  Objective: Vital signs stable he is alert and oriented x 3 K wires intact to the third toe.  There is mild edema no erythema cellulitis drainage odor incision sites are gone on to heal uneventfully.  Radiographs taken today demonstrate well-healing osteotomy internal fixation is intact K wires in good position.  It appears that he is going to go ahead and arthrodesis that PIPJ provided there are no changes.  Assessment: Well-healing surgical foot.  Plan: Redressed today dressed compressive dressing like to follow-up with him in about 2 weeks to make sure that he is healing well and hopefully be able to pull the pin at that point.

## 2022-10-18 DIAGNOSIS — Z1211 Encounter for screening for malignant neoplasm of colon: Secondary | ICD-10-CM | POA: Diagnosis not present

## 2022-10-18 DIAGNOSIS — Z1212 Encounter for screening for malignant neoplasm of rectum: Secondary | ICD-10-CM | POA: Diagnosis not present

## 2022-10-28 ENCOUNTER — Encounter: Payer: Self-pay | Admitting: Podiatry

## 2022-10-28 ENCOUNTER — Ambulatory Visit (INDEPENDENT_AMBULATORY_CARE_PROVIDER_SITE_OTHER): Payer: Medicare HMO

## 2022-10-28 ENCOUNTER — Ambulatory Visit (INDEPENDENT_AMBULATORY_CARE_PROVIDER_SITE_OTHER): Payer: Medicare HMO | Admitting: Podiatry

## 2022-10-28 VITALS — BP 138/77 | HR 69

## 2022-10-28 DIAGNOSIS — M2042 Other hammer toe(s) (acquired), left foot: Secondary | ICD-10-CM

## 2022-10-28 DIAGNOSIS — Z9889 Other specified postprocedural states: Secondary | ICD-10-CM

## 2022-10-28 NOTE — Progress Notes (Signed)
He presents today for postop visit date of surgery September 13, 2022 third metatarsal osteotomy and hammertoe repair #3 flexor tenotomy toes 4 and 5 in the left foot.  He is only distillate on the bottom most likely the boot.  Denies fever chills nausea vomit muscle aches and pains.  Objective: Vital signs stable he is alert oriented x 3 but appears to be rectus K wires and third toe appears to be healing very nicely.  Radiographically confirms third metatarsal osteotomy with screw fixation intact.  K wires intact to the third toe.  Assessment: Well-healing surgical foot.  Plan: Remove K wire today.  Placed him in a light dressing and start walking with a tennis shoe I will follow-up with him in about 3 to 4 weeks.

## 2022-11-25 ENCOUNTER — Ambulatory Visit (INDEPENDENT_AMBULATORY_CARE_PROVIDER_SITE_OTHER): Payer: Medicare HMO

## 2022-11-25 ENCOUNTER — Ambulatory Visit: Payer: Medicare HMO | Admitting: Podiatry

## 2022-11-25 DIAGNOSIS — M2042 Other hammer toe(s) (acquired), left foot: Secondary | ICD-10-CM

## 2022-11-25 DIAGNOSIS — Z9889 Other specified postprocedural states: Secondary | ICD-10-CM

## 2022-11-25 DIAGNOSIS — D2372 Other benign neoplasm of skin of left lower limb, including hip: Secondary | ICD-10-CM | POA: Diagnosis not present

## 2022-11-25 NOTE — Progress Notes (Signed)
[name] presents today date of surgery 09/13/2022 third metatarsal osteotomy hammertoe repair #3 with flexor tenotomy's to toes #4 5 of the left foot.  Denies fever chills nausea vomit muscle aches pains states that he is starting to have some pain on the plantar aspect of the left foot for that hard area is.  Objective: Vital signs stable he is alert and oriented x 3.  The toe is mildly swollen he does have a reactive keratoma to the plantar aspect of the left foot which was there prior to our procedure and I never trimmed it.  So at this point with him for weightbearing I went ahead and trimmed it.  He does not demonstrate any type of verrucoid lesion.  Assessment: Well-healing surgical foot painful lesion plantar aspect of forefoot left.  Plan: I debrided the reactive hyper keratoma plantar aspect of the left foot follow-up with him in 1 month or as needed.

## 2022-12-15 ENCOUNTER — Ambulatory Visit: Payer: Medicare HMO | Admitting: Nurse Practitioner

## 2022-12-16 ENCOUNTER — Ambulatory Visit: Payer: Medicare HMO | Admitting: Nurse Practitioner

## 2022-12-16 NOTE — Progress Notes (Deleted)
MEDICARE ANNUAL WELLNESS VISIT AND FOLLOW UP Assessment:  Christopher Lawrence was seen today for medicare wellness.  Diagnoses and all orders for this visit:  Encounter for Medicare Annual Wellness Visit  Due Annually  Health Maintenance Reviewed  Healthy lifestyle reviewed and goals set   Labile hypertension - continue DASH diet, exercise and monitor at home. Call if greater than 130/80.    Hyperlipidemia, mixed Controlled without medication, continue diet and exercise Limit saturated fats  Abnormal glucose Continue diet and exercise Limit processed carbohydrates  Vitamin D deficiency Continue supplementation to achieve therapeutic goal of 60-100  Medication management continued  Overweight with body mass index (BMI) of 26 to 26.9 in adult Long discussion about weight loss, diet, and exercise Recommended diet heavy in fruits and veggies and low in animal meats, cheeses, and dairy products, appropriate calorie intake Follow up at next visit      Over 30 minutes of exam, counseling, chart review, and critical decision making was performed  Future Appointments  Date Time Provider [place]  12/20/2022  2:30 PM Alycia Rossetti, NP [facility] None  09/08/2023  2:00 PM Unk Pinto, MD [facility] None  12/13/2023  2:00 PM Alycia Rossetti, NP [facility] None     Plan:   During the course of the visit the patient was educated and counseled about appropriate screening and preventive services including:   Pneumococcal vaccine  Influenza vaccine Prevnar 13 Td vaccine Screening electrocardiogram Colorectal cancer screening Diabetes screening Glaucoma screening Nutrition counseling    Subjective:  Christopher Lawrence is a 67 y.o. male who presents for Medicare Annual Wellness Visit and 3 month follow up for HTN, hyperlipidemia, prediabetes, and vitamin D Def.   He has been having pain in his left foot. Having surgery in 2 days to correct 2 hammer toes in left  foot.  His blood pressure has been controlled at home, today their BP is   He does workout. He denies chest pain, shortness of breath, dizziness.  He is not on cholesterol medication and denies myalgias. His cholesterol is at goal. The cholesterol last visit was:   Lab Results  Component Value Date   CHOL 154 09/02/2022   HDL 63 09/02/2022   LDLCALC 72 09/02/2022   TRIG 103 09/02/2022   CHOLHDL 2.4 09/02/2022   He has been working on diet and exercise for abnormal glucose. Last A1C in the office was:  Lab Results  Component Value Date   HGBA1C 5.4 09/02/2022   Last GFR Lab Results  Component Value Date   GFRNONAA 95 08/27/2020     Patient is on Vitamin D supplement.   Lab Results  Component Value Date   VD25OH 71 09/02/2022      BMI is There is no height or weight on file to calculate BMI., he has been working on diet and exercise. Wt Readings from Last 3 Encounters:  09/02/22 204 lb 9.6 oz (92.8 kg)  10/21/21 204 lb (92.5 kg)  09/02/21 199 lb (90.3 kg)   Lab Results  Component Value Date   PSA 2.31 09/02/2022   PSA 2.03 09/02/2021   PSA 3.42 08/27/2020      Medication Review:    Current Outpatient Medications (Respiratory):    fexofenadine (ALLEGRA) 180 MG tablet, Take 1 tablet (180 mg total) by mouth daily.   fluticasone (FLONASE) 50 MCG/ACT nasal spray, SPRAY 2 SPRAYS INTO EACH NOSTRIL EVERY DAY    Current Outpatient Medications (Other):    ondansetron (ZOFRAN) 4 MG tablet,  Take 1 tablet (4 mg total) by mouth every 8 (eight) hours as needed.   BIOTIN PO, Take by mouth.   Calcium Carbonate (CALCIUM 600 PO), Take 2 tablets by mouth daily.   Cholecalciferol (VITAMIN D PO), Take 2,000 Units by mouth 2 (two) times daily.    clobetasol ointment (TEMOVATE) 0.05 %, APPLY TO AFFECTED AREA TWICE A DAY   Magnesium 500 MG TABS, Take 1 tablet by mouth daily.    Multiple Vitamins-Minerals (MULTIVITAMIN PO), Take 1 tablet by mouth daily.     NEOMYCIN-POLYMYXIN-HYDROCORTISONE (CORTISPORIN) 1 % SOLN OTIC solution, Apply 1-2 drops to toe BID after soaking   OVER THE COUNTER MEDICATION, Saw Palmetto 2 tabs daily   zinc gluconate 50 MG tablet, Take 50 mg by mouth daily.  Allergies: Allergies  Allergen Reactions   Wasp Venom Anaphylaxis    Wasp/Hornet/Bee   Androgel [Testosterone] Other (See Comments)    Headaches    Current Problems (verified) has Gastroesophageal reflux disease; Labile hypertension; Testosterone deficiency; Psoriasis; Vitamin D deficiency; Diabetes mellitus screening; Hyperlipidemia, mixed; Abnormal glucose; Hx of adenomatous polyp of colon; and Fatigue on their problem list.  Screening Tests Immunization History  Administered Date(s) Administered   Influenza Inj Mdck Quad With Preservative 09/30/2017, 08/23/2018, 09/03/2019, 08/27/2020   Influenza Split 07/19/2013, 07/29/2014, 07/31/2015   Influenza, High Dose Seasonal PF 09/02/2021, 09/02/2022   Influenza,inj,quad, With Preservative 08/26/2016   PFIZER(Purple Top)SARS-COV-2 Vaccination 01/22/2020, 02/18/2020, 09/12/2020, 05/19/2021   PNEUMOCOCCAL CONJUGATE-20 10/21/2021   PPD Test 07/29/2014, 07/31/2015, 08/26/2016, 06/20/2017, 07/11/2018, 07/30/2019, 08/27/2020   Pneumococcal Polysaccharide-23 11/01/2008   Td 11/01/2006, 06/20/2017   Zoster, Live 08/26/2016    Preventative care: Last colonoscopy: 10/29/19 Dr. Carlean Purl due 2025  Prior vaccinations: TD or Tdap: 2018  Influenza: 09/02/21  Pneumococcal: 10/21/2021 (Prevnar 20) Prevnar13:  Shingles/Zostavax: zoster live 2017  Names of Other Physician/Practitioners you currently use: 1. [facility] here for primary care 2. Dr. Sabra Heck , eye doctor, last visit 2022 3. Dr Randol Kern, dentist, last visit 2022 Patient Care Team: Unk Pinto, MD as PCP - General (Internal Medicine) Festus Aloe, MD as Consulting Physician (Urology) Richmond Campbell, MD as  Consulting Physician (Gastroenterology)  Surgical: He  has a past surgical history that includes Hemorrhoid banding (04/2011); Hernia repair (Right, 1958); Colonoscopy (10/2016); Laparoscopic inguinal hernia with umbilical hernia (N/A, [id number]); Insertion of mesh (N/A, 08/30/2014); Colonoscopy (N/A, 10/2016); and Polypectomy. Family His family history includes Arthritis in his father; COPD in his father; Cancer in his father and mother; Colon polyps in his mother; Hypertension in his mother; Prostate cancer in his father; Uterine cancer in his mother. Social history  He reports that he quit smoking about 26 years ago. His smoking use included cigarettes. He has never used smokeless tobacco. He reports current alcohol use of about 14.0 standard drinks of alcohol per week. He reports that he does not use drugs.  MEDICARE WELLNESS OBJECTIVES: Physical activity:   Cardiac risk factors:   Depression/mood screen:      09/01/2022   11:49 PM  Depression screen PHQ 2/9  Decreased Interest 0  Down, Depressed, Hopeless 0  PHQ - 2 Score 0    ADLs:     09/01/2022   11:51 PM  In your present state of health, do you have any difficulty performing the following activities:  Hearing? 0  Vision? 0  Difficulty concentrating or making decisions? 0  Walking or climbing stairs? 0  Dressing or bathing? 0  Doing errands, shopping? 0  Cognitive Testing  Alert? Yes  Normal Appearance?Yes  Oriented to person? Yes  Place? Yes   Time? Yes  Recall of three objects?  Yes  Can perform simple calculations? Yes  Displays appropriate judgment?Yes  Can read the correct time from a watch face?Yes  EOL planning:     Objective:   There were no vitals filed for this visit.  There is no height or weight on file to calculate BMI.  General appearance: alert, no distress, WD/WN, male HEENT: normocephalic, sclerae anicteric, TMs pearly, nares patent, no discharge or erythema, pharynx normal Oral cavity:  MMM, no lesions Neck: supple, no lymphadenopathy, no thyromegaly, no masses Heart: RRR, normal S1, S2, no murmurs Lungs: CTA bilaterally, no wheezes, rhonchi, or rales Abdomen: +bs, soft, non tender, non distended, no masses, no hepatomegaly, no splenomegaly Musculoskeletal: nontender, no swelling, no obvious deformity Extremities: no edema, no cyanosis, no clubbing Pulses: 2+ symmetric, upper and lower extremities, normal cap refill Neurological: alert, oriented x 3, CN2-12 intact, strength normal upper extremities and lower extremities, sensation normal throughout, DTRs 2+ throughout, no cerebellar signs, gait normal Psychiatric: normal affect, behavior normal, pleasant   Medicare Attestation I have personally reviewed: The patient's medical and social history Their use of alcohol, tobacco or illicit drugs Their current medications and supplements The patient's functional ability including ADLs,fall risks, home safety risks, cognitive, and hearing and visual impairment Diet and physical activities Evidence for depression or mood disorders  The patient's weight, height, BMI, and visual acuity have been recorded in the chart.  I have made referrals, counseling, and provided education to the patient based on review of the above and I have provided the patient with a written personalized care plan for preventive services.     Alycia Rossetti, NP   12/16/2022

## 2022-12-20 ENCOUNTER — Ambulatory Visit: Payer: Medicare HMO | Admitting: Nurse Practitioner

## 2022-12-20 DIAGNOSIS — E782 Mixed hyperlipidemia: Secondary | ICD-10-CM

## 2022-12-20 DIAGNOSIS — R7309 Other abnormal glucose: Secondary | ICD-10-CM

## 2022-12-20 DIAGNOSIS — Z79899 Other long term (current) drug therapy: Secondary | ICD-10-CM

## 2022-12-20 DIAGNOSIS — E559 Vitamin D deficiency, unspecified: Secondary | ICD-10-CM

## 2022-12-20 DIAGNOSIS — Z0000 Encounter for general adult medical examination without abnormal findings: Secondary | ICD-10-CM

## 2022-12-20 DIAGNOSIS — E663 Overweight: Secondary | ICD-10-CM

## 2022-12-20 DIAGNOSIS — R0989 Other specified symptoms and signs involving the circulatory and respiratory systems: Secondary | ICD-10-CM

## 2022-12-20 DIAGNOSIS — Z87891 Personal history of nicotine dependence: Secondary | ICD-10-CM

## 2022-12-20 DIAGNOSIS — N138 Other obstructive and reflux uropathy: Secondary | ICD-10-CM

## 2022-12-28 NOTE — Progress Notes (Signed)
MEDICARE ANNUAL WELLNESS VISIT AND FOLLOW UP Assessment:  Christopher Lawrence was seen today for medicare wellness.  Diagnoses and all orders for this visit:  Encounter for Medicare Annual Wellness Visit  Due Annually  Health Maintenance Reviewed  Healthy lifestyle reviewed and goals set   Labile hypertension - continue DASH diet, exercise and monitor at home. Call if greater than 130/80.    Hyperlipidemia, mixed Controlled without medication, continue diet and exercise Limit saturated fats - Lipid panel  Abnormal glucose Continue diet and exercise Limit processed carbohydrates - CBC, CMP  Vitamin D deficiency Continue supplementation to achieve therapeutic goal of 60-100  Medication management Magnesium  Overweight with body mass index (BMI) of 26 to 26.9 in adult Long discussion about weight loss, diet, and exercise Recommended diet heavy in fruits and veggies and low in animal meats, cheeses, and dairy products, appropriate calorie intake Follow up at next visit   Skin lesion of scalp Dark brown skin lesion on scalp behind right ear -     Ambulatory referral to Dermatology  Psoriasis Continue clobetasol cream as directed  Onychomycosis -     terbinafine (LAMISIL) 250 MG tablet; Take 1 tablet (250 mg total) by mouth daily. Take 1 pill daily for one month, off for 1 month, one a month, etc until gone #30 with 3 refills        Over 30 minutes of exam, counseling, chart review, and critical decision making was performed  Future Appointments  Date Time Provider Department Center  09/08/2023  2:00 PM Lucky Cowboy, MD [facility] None  12/13/2023  2:00 PM Raynelle Dick, NP [facility] None     Plan:   During the course of the visit the patient was educated and counseled about appropriate screening and preventive services including:   Pneumococcal vaccine  Influenza vaccine Prevnar 13 Td vaccine Screening electrocardiogram Colorectal cancer screening Diabetes  screening Glaucoma screening Nutrition counseling    Subjective:  Christopher Lawrence is a 67 y.o. male who presents for Medicare Annual Wellness Visit and 3 month follow up for HTN, hyperlipidemia, prediabetes, and vitamin D Def.  Marland Kitchen  His blood pressure has been controlled at home, today their BP is BP: 118/70  BP Readings from Last 3 Encounters:  12/29/22 118/70  10/28/22 138/77  09/02/22 128/76  He does workout. He denies chest pain, shortness of breath, dizziness.   BMI is Body mass index is 26.97 kg/m., he has been working on diet and exercise. Wt Readings from Last 3 Encounters:  12/29/22 207 lb 3.2 oz (94 kg)  09/02/22 204 lb 9.6 oz (92.8 kg)  10/21/21 204 lb (92.5 kg)    He is not on cholesterol medication and denies myalgias. His cholesterol is at goal. The cholesterol last visit was:   Lab Results  Component Value Date   CHOL 154 09/02/2022   HDL 63 09/02/2022   LDLCALC 72 09/02/2022   TRIG 103 09/02/2022   CHOLHDL 2.4 09/02/2022   He has been working on diet and exercise for abnormal glucose. Last A1C in the office was:  Lab Results  Component Value Date   HGBA1C 5.4 09/02/2022   Last GFR Lab Results  Component Value Date   EGFR 101 09/02/2022     Patient is on Vitamin D supplement.   Lab Results  Component Value Date   VD25OH 71 09/02/2022       Lab Results  Component Value Date   PSA 2.31 09/02/2022   PSA 2.03 09/02/2021  PSA 3.42 08/27/2020      Medication Review:    Current Outpatient Medications (Respiratory):    fexofenadine (ALLEGRA) 180 MG tablet, Take 1 tablet (180 mg total) by mouth daily.   fluticasone (FLONASE) 50 MCG/ACT nasal spray, SPRAY 2 SPRAYS INTO EACH NOSTRIL EVERY DAY    Current Outpatient Medications (Other):    BIOTIN PO, Take by mouth.   Calcium Carbonate (CALCIUM 600 PO), Take 1 tablet by mouth daily.   Cholecalciferol (VITAMIN D PO), Take 2,000 Units by mouth 2 (two) times daily. 5,000 units   clobetasol ointment  (TEMOVATE) 0.05 %, APPLY TO AFFECTED AREA TWICE A DAY   Magnesium 500 MG TABS, Take 1 tablet by mouth daily.    Multiple Vitamins-Minerals (MULTIVITAMIN PO), Take 1 tablet by mouth daily.    ondansetron (ZOFRAN) 4 MG tablet, Take 1 tablet (4 mg total) by mouth every 8 (eight) hours as needed. (Patient not taking: Reported on 12/29/2022)   OVER THE COUNTER MEDICATION, Saw Palmetto 2 tabs daily   zinc gluconate 50 MG tablet, Take 50 mg by mouth daily.   NEOMYCIN-POLYMYXIN-HYDROCORTISONE (CORTISPORIN) 1 % SOLN OTIC solution, Apply 1-2 drops to toe BID after soaking (Patient not taking: Reported on 12/29/2022)  Allergies: Allergies  Allergen Reactions   Wasp Venom Anaphylaxis    Wasp/Hornet/Bee   Androgel [Testosterone] Other (See Comments)    Headaches    Current Problems (verified) has Gastroesophageal reflux disease; Labile hypertension; Testosterone deficiency; Psoriasis; Vitamin D deficiency; Diabetes mellitus screening; Hyperlipidemia, mixed; Abnormal glucose; Hx of adenomatous polyp of colon; and Fatigue on their problem list.  Screening Tests Immunization History  Administered Date(s) Administered   Covid-19, Mrna,Vaccine(Spikevax)43yrs and older 09/06/2022   Influenza Inj Mdck Quad With Preservative 09/30/2017, 08/23/2018, 09/03/2019, 08/27/2020   Influenza Split 07/19/2013, 07/29/2014, 07/31/2015   Influenza, High Dose Seasonal PF 09/02/2021, 09/02/2022   Influenza,inj,quad, With Preservative 08/26/2016   PFIZER(Purple Top)SARS-COV-2 Vaccination 01/22/2020, 02/18/2020, 09/12/2020, 05/19/2021   PNEUMOCOCCAL CONJUGATE-20 10/21/2021   PPD Test 07/29/2014, 07/31/2015, 08/26/2016, 06/20/2017, 07/11/2018, 07/30/2019, 08/27/2020   Pneumococcal Polysaccharide-23 11/01/2008   Respiratory Syncytial Virus Vaccine,Recomb Aduvanted(Arexvy) 09/06/2022   Td 11/01/2006, 06/20/2017   Zoster, Live 08/26/2016   Health Maintenance  Topic Date Due   COVID-19 Vaccine (6 - 2023-24 season)  01/14/2023 (Originally 11/01/2022)   Medicare Annual Wellness (AWV)  12/30/2023   COLONOSCOPY (Pts 45-88yrs Insurance coverage will need to be confirmed)  10/28/2024   DTaP/Tdap/Td (3 - Tdap) 06/21/2027   Pneumonia Vaccine 65+ Years old  Completed   INFLUENZA VACCINE  Completed   Hepatitis C Screening  Completed   HPV VACCINES  Aged Out   Zoster Vaccines- Shingrix  Discontinued    Names of Other Physician/Practitioners you currently use: 1. [facility] here for primary care 2. Dr. Hyacinth Meeker , eye doctor, last visit 01/2022 3. Dr Jon Billings, dentist, last visit 09/2022 Patient Care Team: Lucky Cowboy, MD as PCP - General (Internal Medicine) Jerilee Field, MD as Consulting Physician (Urology) Sharrell Ku, MD as Consulting Physician (Gastroenterology)  Surgical: He  has a past surgical history that includes Hemorrhoid banding (04/2011); Hernia repair (Right, 1958); Colonoscopy (10/2016); Laparoscopic inguinal hernia with umbilical hernia (N/A, 08/30/2014); Insertion of mesh (N/A, 08/30/2014); Colonoscopy (N/A, 10/2016); and Polypectomy. Family His family history includes Arthritis in his father; COPD in his father; Cancer in his father and mother; Colon polyps in his mother; Hypertension in his mother; Prostate cancer in his father; Uterine cancer in his mother. Social history  He reports  that he quit smoking about 26 years ago. His smoking use included cigarettes. He has never used smokeless tobacco. He reports current alcohol use of about 14.0 standard drinks of alcohol per week. He reports that he does not use drugs.  MEDICARE WELLNESS OBJECTIVES: Physical activity: Current Exercise Habits: Home exercise routine, Type of exercise: walking;strength training/weights, Time (Minutes): 60, Frequency (Times/Week): 7, Weekly Exercise (Minutes/Week): 420, Intensity: Mild, Exercise limited by: None identified Cardiac risk factors: Cardiac Risk Factors  include: advanced age (>69men, >64 women);male gender Depression/mood screen:      12/29/2022    2:53 PM  Depression screen PHQ 2/9  Decreased Interest 0  Down, Depressed, Hopeless 0  PHQ - 2 Score 0    ADLs:     12/29/2022    2:53 PM 09/01/2022   11:51 PM  In your present state of health, do you have any difficulty performing the following activities:  Hearing? 0 0  Vision? 0 0  Difficulty concentrating or making decisions? 0 0  Walking or climbing stairs? 0 0  Dressing or bathing? 0 0  Doing errands, shopping? 0 0     Cognitive Testing  Alert? Yes  Normal Appearance?Yes  Oriented to person? Yes  Place? Yes   Time? Yes  Recall of three objects?  Yes  Can perform simple calculations? Yes  Displays appropriate judgment?Yes  Can read the correct time from a watch face?Yes  EOL planning: Does Patient Have a Medical Advance Directive?: Yes Type of Advance Directive: Healthcare Power of Attorney, Living will Does patient want to make changes to medical advance directive?: No - Patient declined Copy of Healthcare Power of Attorney in Chart?: No - copy requested   Objective:   Today's Vitals   12/29/22 1433  BP: 118/70  Pulse: 65  Temp: 98.1 F (36.7 C)  SpO2: 95%  Weight: 207 lb 3.2 oz (94 kg)  Height: 6' 1.5" (1.867 m)    Body mass index is 26.97 kg/m.  General appearance: alert, no distress, WD/WN, male HEENT: normocephalic, sclerae anicteric, TMs pearly, nares patent, no discharge or erythema, pharynx normal Oral cavity: MMM, no lesions Neck: supple, no lymphadenopathy, no thyromegaly, no masses Heart: RRR, normal S1, S2, no murmurs Lungs: CTA bilaterally, no wheezes, rhonchi, or rales Abdomen: +bs, soft, non tender, non distended, no masses, no hepatomegaly, no splenomegaly Musculoskeletal: nontender, no swelling, no obvious deformity Extremities: no edema, no cyanosis, no clubbing Pulses: 2+ symmetric, upper and lower extremities, normal cap  refill Neurological: alert, oriented x 3, CN2-12 intact, strength normal upper extremities and lower extremities, sensation normal throughout, DTRs 2+ throughout, no cerebellar signs, gait normal Skin : left foot 4th digit has onychomycosis of nail. Scalp behind right ear dark brown and slightly raised and about the size of a pencil eraser. Scaly patches of hands elbows and shins from psoriasis Psychiatric: normal affect, behavior normal, pleasant   Medicare Attestation I have personally reviewed: The patient's medical and social history Their use of alcohol, tobacco or illicit drugs Their current medications and supplements The patient's functional ability including ADLs,fall risks, home safety risks, cognitive, and hearing and visual impairment Diet and physical activities Evidence for depression or mood disorders  The patient's weight, height, BMI, and visual acuity have been recorded in the chart.  I have made referrals, counseling, and provided education to the patient based on review of the above and I have provided the patient with a written personalized care plan for preventive services.     Christopher Lawrence  Hollie Salk, NP   12/29/2022

## 2022-12-29 ENCOUNTER — Encounter: Payer: Self-pay | Admitting: Nurse Practitioner

## 2022-12-29 ENCOUNTER — Ambulatory Visit (INDEPENDENT_AMBULATORY_CARE_PROVIDER_SITE_OTHER): Payer: Medicare HMO | Admitting: Nurse Practitioner

## 2022-12-29 VITALS — BP 118/70 | HR 65 | Temp 98.1°F | Ht 73.5 in | Wt 207.2 lb

## 2022-12-29 DIAGNOSIS — E559 Vitamin D deficiency, unspecified: Secondary | ICD-10-CM | POA: Diagnosis not present

## 2022-12-29 DIAGNOSIS — Z0001 Encounter for general adult medical examination with abnormal findings: Secondary | ICD-10-CM | POA: Diagnosis not present

## 2022-12-29 DIAGNOSIS — R0989 Other specified symptoms and signs involving the circulatory and respiratory systems: Secondary | ICD-10-CM

## 2022-12-29 DIAGNOSIS — N138 Other obstructive and reflux uropathy: Secondary | ICD-10-CM

## 2022-12-29 DIAGNOSIS — Z0000 Encounter for general adult medical examination without abnormal findings: Secondary | ICD-10-CM

## 2022-12-29 DIAGNOSIS — Z6826 Body mass index (BMI) 26.0-26.9, adult: Secondary | ICD-10-CM | POA: Diagnosis not present

## 2022-12-29 DIAGNOSIS — Z79899 Other long term (current) drug therapy: Secondary | ICD-10-CM

## 2022-12-29 DIAGNOSIS — L409 Psoriasis, unspecified: Secondary | ICD-10-CM

## 2022-12-29 DIAGNOSIS — L989 Disorder of the skin and subcutaneous tissue, unspecified: Secondary | ICD-10-CM

## 2022-12-29 DIAGNOSIS — N401 Enlarged prostate with lower urinary tract symptoms: Secondary | ICD-10-CM

## 2022-12-29 DIAGNOSIS — B351 Tinea unguium: Secondary | ICD-10-CM

## 2022-12-29 DIAGNOSIS — E663 Overweight: Secondary | ICD-10-CM

## 2022-12-29 DIAGNOSIS — R6889 Other general symptoms and signs: Secondary | ICD-10-CM

## 2022-12-29 DIAGNOSIS — E782 Mixed hyperlipidemia: Secondary | ICD-10-CM

## 2022-12-29 DIAGNOSIS — R7309 Other abnormal glucose: Secondary | ICD-10-CM

## 2022-12-29 MED ORDER — TERBINAFINE HCL 250 MG PO TABS: 250.0000 mg | ORAL_TABLET | Freq: Every day | ORAL | 0 refills | Status: DC

## 2022-12-29 NOTE — Patient Instructions (Addendum)

## 2022-12-30 LAB — COMPLETE METABOLIC PANEL WITH GFR
AG Ratio: 1.9 (calc) (ref 1.0–2.5)
ALT: 35 U/L (ref 9–46)
AST: 21 U/L (ref 10–35)
Albumin: 4.6 g/dL (ref 3.6–5.1)
Alkaline phosphatase (APISO): 97 U/L (ref 35–144)
BUN/Creatinine Ratio: 25 (calc) — ABNORMAL HIGH (ref 6–22)
BUN: 17 mg/dL (ref 7–25)
CO2: 21 mmol/L (ref 20–32)
Calcium: 9.5 mg/dL (ref 8.6–10.3)
Chloride: 107 mmol/L (ref 98–110)
Creat: 0.67 mg/dL — ABNORMAL LOW (ref 0.70–1.35)
Globulin: 2.4 g/dL (calc) (ref 1.9–3.7)
Glucose, Bld: 82 mg/dL (ref 65–99)
Potassium: 4.8 mmol/L (ref 3.5–5.3)
Sodium: 139 mmol/L (ref 135–146)
Total Bilirubin: 0.5 mg/dL (ref 0.2–1.2)
Total Protein: 7 g/dL (ref 6.1–8.1)
eGFR: 103 mL/min/{1.73_m2} (ref >=60)

## 2022-12-30 LAB — CBC WITH DIFFERENTIAL/PLATELET
Absolute Monocytes: 630 cells/uL (ref 200–950)
Basophils Absolute: 90 cells/uL (ref 0–200)
Basophils Relative: 1.2 %
Eosinophils Absolute: 330 cells/uL (ref 15–500)
Eosinophils Relative: 4.4 %
HCT: 45.8 % (ref 38.5–50.0)
Hemoglobin: 15.9 g/dL (ref 13.2–17.1)
Lymphs Abs: 2115 cells/uL (ref 850–3900)
MCH: 30 pg (ref 27.0–33.0)
MCHC: 34.7 g/dL (ref 32.0–36.0)
MCV: 86.4 fL (ref 80.0–100.0)
MPV: 10.7 fL (ref 7.5–12.5)
Monocytes Relative: 8.4 %
Neutro Abs: 4335 cells/uL (ref 1500–7800)
Neutrophils Relative %: 57.8 %
Platelets: 250 10*3/uL (ref 140–400)
RBC: 5.3 10*6/uL (ref 4.20–5.80)
RDW: 13.6 % (ref 11.0–15.0)
Total Lymphocyte: 28.2 %
WBC: 7.5 10*3/uL (ref 3.8–10.8)

## 2022-12-30 LAB — LIPID PANEL
Cholesterol: 157 mg/dL (ref <200)
HDL: 60 mg/dL (ref >=40)
LDL Cholesterol (Calc): 76 mg/dL (calc)
Non-HDL Cholesterol (Calc): 97 mg/dL (calc) (ref <130)
Total CHOL/HDL Ratio: 2.6 (calc) (ref <5.0)
Triglycerides: 129 mg/dL (ref <150)

## 2022-12-30 LAB — MAGNESIUM: Magnesium: 2 mg/dL (ref 1.5–2.5)

## 2023-03-29 DIAGNOSIS — Z789 Other specified health status: Secondary | ICD-10-CM | POA: Diagnosis not present

## 2023-03-29 DIAGNOSIS — L400 Psoriasis vulgaris: Secondary | ICD-10-CM | POA: Diagnosis not present

## 2023-03-29 DIAGNOSIS — L728 Other follicular cysts of the skin and subcutaneous tissue: Secondary | ICD-10-CM | POA: Diagnosis not present

## 2023-03-29 DIAGNOSIS — R208 Other disturbances of skin sensation: Secondary | ICD-10-CM | POA: Diagnosis not present

## 2023-03-29 DIAGNOSIS — L821 Other seborrheic keratosis: Secondary | ICD-10-CM | POA: Diagnosis not present

## 2023-03-29 DIAGNOSIS — L298 Other pruritus: Secondary | ICD-10-CM | POA: Diagnosis not present

## 2023-03-29 DIAGNOSIS — L538 Other specified erythematous conditions: Secondary | ICD-10-CM | POA: Diagnosis not present

## 2023-03-29 DIAGNOSIS — L820 Inflamed seborrheic keratosis: Secondary | ICD-10-CM | POA: Diagnosis not present

## 2023-04-08 ENCOUNTER — Encounter: Payer: Self-pay | Admitting: Nurse Practitioner

## 2023-04-08 ENCOUNTER — Ambulatory Visit
Admission: RE | Admit: 2023-04-08 | Discharge: 2023-04-08 | Disposition: A | Discharge status: Home / Self Care | Payer: Medicare HMO | Source: Ambulatory Visit | Attending: Nurse Practitioner | Admitting: Nurse Practitioner

## 2023-04-08 ENCOUNTER — Ambulatory Visit (INDEPENDENT_AMBULATORY_CARE_PROVIDER_SITE_OTHER): Payer: Medicare HMO | Admitting: Nurse Practitioner

## 2023-04-08 VITALS — BP 120/62 | HR 62 | Temp 97.7°F | Ht 73.5 in | Wt 205.6 lb

## 2023-04-08 DIAGNOSIS — M7501 Adhesive capsulitis of right shoulder: Secondary | ICD-10-CM

## 2023-04-08 DIAGNOSIS — M25511 Pain in right shoulder: Secondary | ICD-10-CM

## 2023-04-08 DIAGNOSIS — M25619 Stiffness of unspecified shoulder, not elsewhere classified: Secondary | ICD-10-CM | POA: Diagnosis not present

## 2023-04-08 MED ORDER — PREDNISONE 10 MG PO TABS: ORAL_TABLET | ORAL | 0 refills | Status: DC

## 2023-04-08 NOTE — Progress Notes (Unsigned)
Assessment and Plan:  Mathias Cicero Basque was seen today for an episodic visit.  Diagnoses and all order for this visit:  1. Adhesive capsulitis of right shoulder Start tmt with steroid taper Discussed PT or referral to Orthopedics if s/s fail to improve.  - predniSONE (DELTASONE) 10 MG tablet; 1 tab 3 x day for 2 days, then 1 tab 2 x day for 2 days, then 1 tab 1 x day for 3 days  Dispense: 13 tablet; Refill: 0 - DG Shoulder Right; Future  2. Limited range of motion (ROM) of shoulder  - DG Shoulder Right; Future  3. Acute pain of right shoulder  - DG Shoulder Right; Future   Notify office for further evaluation and treatment, questions or concerns if s/s fail to improve. The risks and benefits of my recommendations, as well as other treatment options were discussed with the patient today. Questions were answered.  Further disposition pending results of labs. Discussed med's effects and SE's.    Over 20 minutes of exam, counseling, chart review, and critical decision making was performed.   Future Appointments  Date Time Provider Department Center  09/08/2023  2:00 PM Lucky Cowboy, MD [facility] None  12/13/2023  2:00 PM Raynelle Dick, NP [facility] None    ------------------------------------------------------------------------------------------------------------------   HPI BP 120/62   Pulse 62   Temp 97.7 F (36.5 C)   Ht 6' 1.5" (1.867 m)   Wt 205 lb 9.6 oz (93.3 kg)   SpO2 99%   BMI 26.76 kg/m   67 y.o.male presents for evaluation of right shoulder pain x 3 days.  No known injury.  Reports waking up, possibly sleeping on his shoulder.  Woke up with the inability to move right shoulder with LROM.  Unable to abduct past 90 degrees without pain. Unable to pronate or supinate without pain.  Denies any past injury to the shoulder including fall, trauma.  Does work outdoors tending to his yard daily but denies any strenuous lifting or moving.    Past Medical History:   Diagnosis Date   Allergy    GERD (gastroesophageal reflux disease)    Hx of adenomatous polyp of colon 10/27/2016   Labile hypertension    Other testicular hypofunction    Psoriasis      Allergies  Allergen Reactions   Wasp Venom Anaphylaxis    Wasp/Hornet/Bee   Androgel [Testosterone] Other (See Comments)    Headaches    Current Outpatient Medications on File Prior to Visit  Medication Sig   BIOTIN PO Take by mouth.   Calcium Carbonate (CALCIUM 600 PO) Take 1 tablet by mouth daily.   Cholecalciferol (VITAMIN D PO) Take 2,000 Units by mouth 2 (two) times daily. 5,000 units   clobetasol ointment (TEMOVATE) 0.05 % APPLY TO AFFECTED AREA TWICE A DAY   fexofenadine (ALLEGRA) 180 MG tablet Take 1 tablet (180 mg total) by mouth daily.   fluticasone (FLONASE) 50 MCG/ACT nasal spray SPRAY 2 SPRAYS INTO EACH NOSTRIL EVERY DAY   Magnesium 500 MG TABS Take 1 tablet by mouth daily.    Multiple Vitamins-Minerals (MULTIVITAMIN PO) Take 1 tablet by mouth daily.    ondansetron (ZOFRAN) 4 MG tablet Take 1 tablet (4 mg total) by mouth every 8 (eight) hours as needed. (Patient not taking: Reported on 12/29/2022)   OVER THE COUNTER MEDICATION Saw Palmetto 2 tabs daily   zinc gluconate 50 MG tablet Take 50 mg by mouth daily.   NEOMYCIN-POLYMYXIN-HYDROCORTISONE (CORTISPORIN) 1 % SOLN OTIC solution Apply  1-2 drops to toe BID after soaking (Patient not taking: Reported on 12/29/2022)   terbinafine (LAMISIL) 250 MG tablet Take 1 tablet (250 mg total) by mouth daily. Take 1 pill daily for one month, off for 1 month, one a month, etc until gone (Patient not taking: Reported on 04/08/2023)   No current facility-administered medications on file prior to visit.    ROS: all negative except what is noted in the HPI.   Physical Exam:  BP 120/62   Pulse 62   Temp 97.7 F (36.5 C)   Ht 6' 1.5" (1.867 m)   Wt 205 lb 9.6 oz (93.3 kg)   SpO2 99%   BMI 26.76 kg/m   General Appearance: NAD.  Awake,  conversant and cooperative. Eyes: PERRLA, EOMs intact.  Sclera white.  Conjunctiva without erythema. Sinuses: No frontal/maxillary tenderness.  No nasal discharge. Nares patent.  ENT/Mouth: Ext aud canals clear.  Bilateral TMs w/DOL and without erythema or bulging. Hearing intact.  Posterior pharynx without swelling or exudate.  Tonsils without swelling or erythema.  Neck: Supple.  No masses, nodules or thyromegaly. Respiratory: Effort is regular with non-labored breathing. Breath sounds are equal bilaterally without rales, rhonchi, wheezing or stridor.  Cardio: RRR with no MRGs. Brisk peripheral pulses without edema.  Abdomen: Active BS in all four quadrants.  Soft and non-tender without guarding, rebound tenderness, hernias or masses. Lymphatics: Non tender without lymphadenopathy.  Musculoskeletal: LROM to right shoulder d/t pain. 5/5 strength, normal ambulation.  No clubbing or cyanosis. Skin: Appropriate color for ethnicity. Warm without rashes, lesions, ecchymosis, ulcers.  Neuro: CN II-XII grossly normal. Normal muscle tone without cerebellar symptoms and intact sensation.   Psych: AO X 3,  appropriate mood and affect, insight and judgment.   Allergies:  Allergies  Allergen Reactions   Wasp Venom Anaphylaxis    Wasp/Hornet/Bee   Androgel [Testosterone] Other (See Comments)    Headaches   Medical History:  has Gastroesophageal reflux disease; Labile hypertension; Testosterone deficiency; Psoriasis; Vitamin D deficiency; Diabetes mellitus screening; Hyperlipidemia, mixed; Abnormal glucose; Hx of adenomatous polyp of colon; and Fatigue on their problem list. Surgical History:  He  has a past surgical history that includes Hemorrhoid banding (04/2011); Hernia repair (Right, 1958); Colonoscopy (10/2016); Laparoscopic inguinal hernia with umbilical hernia (N/A, 08/30/2014); Insertion of mesh (N/A, 08/30/2014); Colonoscopy (N/A, 10/2016); and Polypectomy. Family History:  Hisfamily history  includes Arthritis in his father; COPD in his father; Cancer in his father and mother; Colon polyps in his mother; Hypertension in his mother; Prostate cancer in his father; Uterine cancer in his mother. Social History:   reports that he quit smoking about 26 years ago. His smoking use included cigarettes. He has never used smokeless tobacco. He reports current alcohol use of about 14.0 standard drinks of alcohol per week. He reports that he does not use drugs.     Adela Glimpse, NP 10:30 AM [facility]

## 2023-04-08 NOTE — Patient Instructions (Signed)
Adhesive Capsulitis  Adhesive capsulitis, also called frozen shoulder, causes the shoulder to become stiff and painful to move. This condition happens when there is inflammation of the tendons and ligaments that surround the shoulder joint (shoulder capsule). Tendons are tissues that connect muscle to bone. Ligaments are tissues that connect bones to each other. What are the causes? This condition may be caused by: An injury to your shoulder joint. Straining your shoulder. Not moving your shoulder for a period of time. This can happen if your arm was injured or in a sling. In some cases, the cause is not known. What increases the risk? You are more likely to develop this condition if: You are male. You are older than 67 years of age. You have certain other conditions, such as: Diabetes. Thyroid problems. Stroke. You recently had surgery, especially shoulder or neck surgery. What are the signs or symptoms? Symptoms of this condition include: Pain in your shoulder when you move your arm. There may also be pain when parts of your shoulder are touched. The pain may be worse at night or when you are resting. Not being able to move your shoulder normally. Sudden muscle tightening (muscle spasms). How is this diagnosed? This condition is diagnosed with a physical exam and imaging tests, such as an X-ray or MRI. How is this treated? This condition may be treated with: Treatment of the injury or condition that caused the adhesive capsulitis. Medicines for pain, inflammation, or muscle spasms. Injections of medicine (steroids) into the shoulder joint. Physical therapy. This involves doing exercises to get the shoulder moving again. Shoulder manipulation. This is a procedure to move the shoulder into another position. It is done after you are given a medicine to make you fall asleep (general anesthesia). The joint may also be injected with salt water at high pressure to break down  scarring. Surgery. This may be done in severe cases when other treatments do not work. Some less common treatments include: Injection of hyaluronic acid into the shoulder joint. This substance is a normal part of the fluid inside joints. It helps lubricate the joint and can lower inflammation. Injection of platelet-rich plasma into the shoulder joint. This uses a type of blood cell from your own blood that may speed up the healing process. Sending energy waves to the affected area (extracorporeal shock wave therapy). Most people recover completely from adhesive capsulitis, but some may not get back full shoulder movement. Follow these instructions at home: Managing pain, stiffness, and swelling     If told, put ice on the injured area. Put ice in a plastic bag. Place a towel between your skin and the bag. Leave the ice on for 20 minutes, 2-3 times a day. If told, apply heat to the affected area before you exercise. Use the heat source that your health care provider recommends, such as a moist heat pack or a heating pad. Place a towel between your skin and the heat source. Leave the heat on for 20-30 minutes. If your skin turns bright red, remove the ice or heat right away to prevent skin damage. The risk of damage is higher if you cannot feel pain, heat, or cold. General instructions Take over-the-counter and prescription medicines only as told by your provider. If you are being treated with physical therapy, do exercises as told. Avoid activities or exercises that put a lot of demand on your shoulder, such as throwing. These can make pain worse. Contact a health care provider if: You have  new symptoms. Your symptoms get worse. This information is not intended to replace advice given to you by your health care provider. Make sure you discuss any questions you have with your health care provider. Document Revised: 08/02/2022 Document Reviewed: 08/02/2022 Elsevier Patient Education  2024  [employer].

## 2023-04-22 ENCOUNTER — Ambulatory Visit: Payer: Medicare HMO | Admitting: Nurse Practitioner

## 2023-06-02 DIAGNOSIS — H5213 Myopia, bilateral: Secondary | ICD-10-CM | POA: Diagnosis not present

## 2023-07-05 IMAGING — MR MR FOOT*L* W/O CM
4 of 8 series · 13 of 40 positions shown · non-contrast
Comparison: Radiographs 06/04/2021

CLINICAL DATA: Second MTP joint plantar plate injury, foot pain and
swelling for 6 months.

EXAM:
MRI OF THE LEFT FOOT WITHOUT CONTRAST
TECHNIQUE: Multiplanar, multisequence MR imaging of the left forefoot was
performed. No intravenous contrast was administered.

[Series 3: PD fat-sat · axial · left · 3.0mm · 0.31mm/px · z∈[-36,+75]mm · 4 of 30 slices shown (1 of 2)]
[im 1/30]
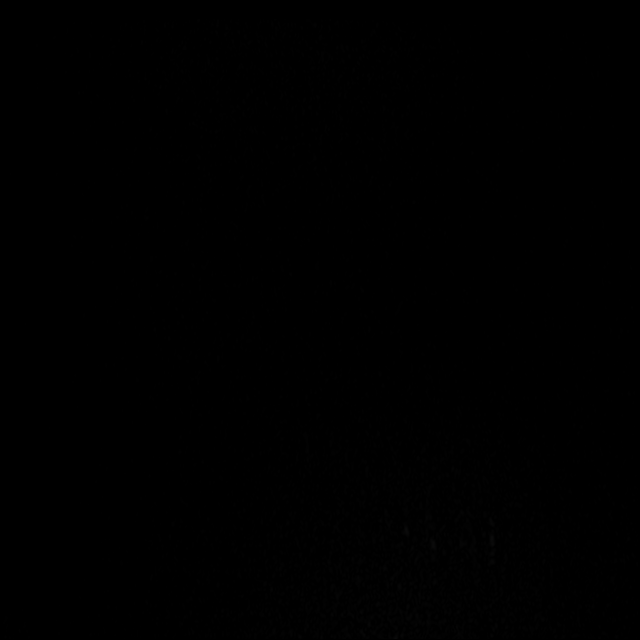
[im 8/30]
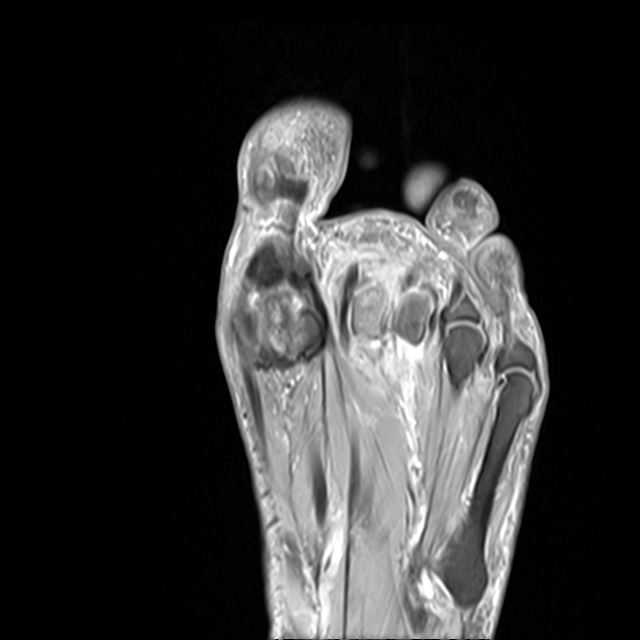
[im 15/30]
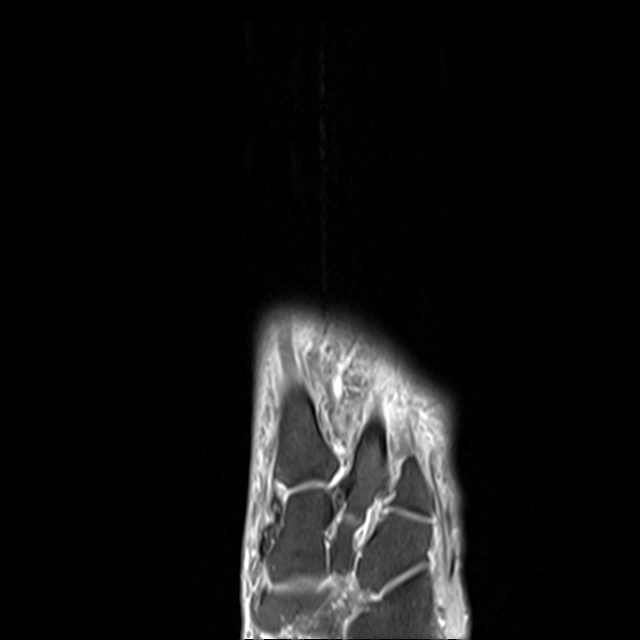
[im 30/30]
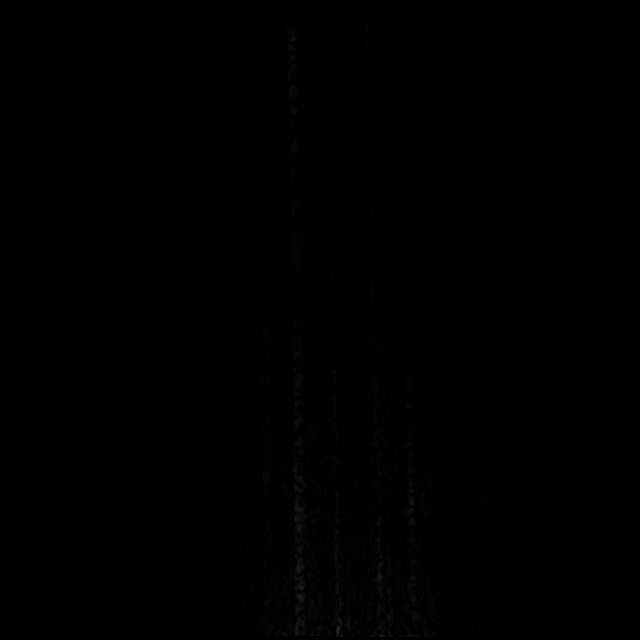

[Series 4: T2 fat-sat · axial · left · 3.0mm · 0.31mm/px · z∈[-35,+77]mm · 3 of 30 slices shown (1 of 2)]
[im 1/30]
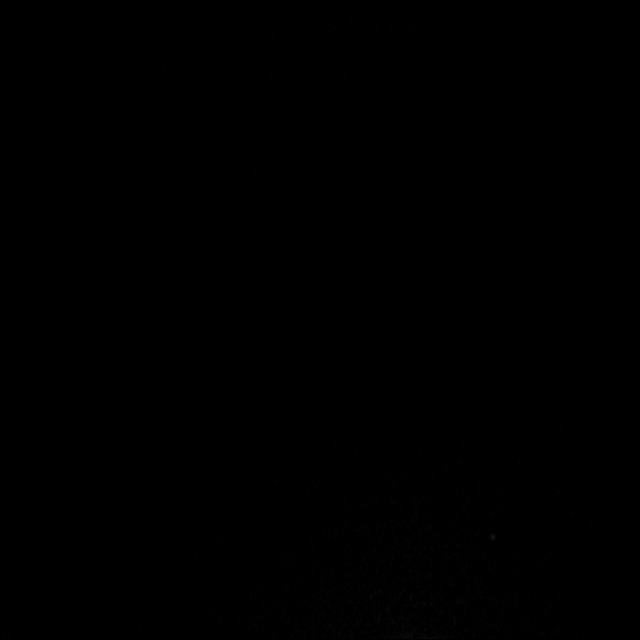
[im 20/30]
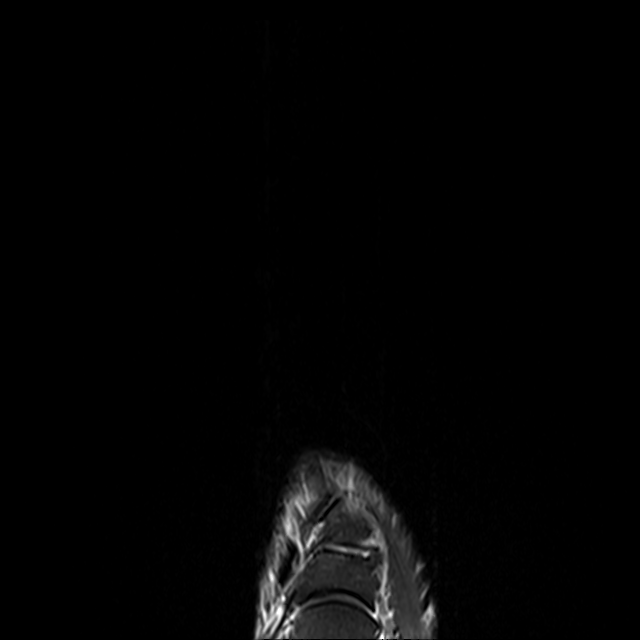
[im 30/30]
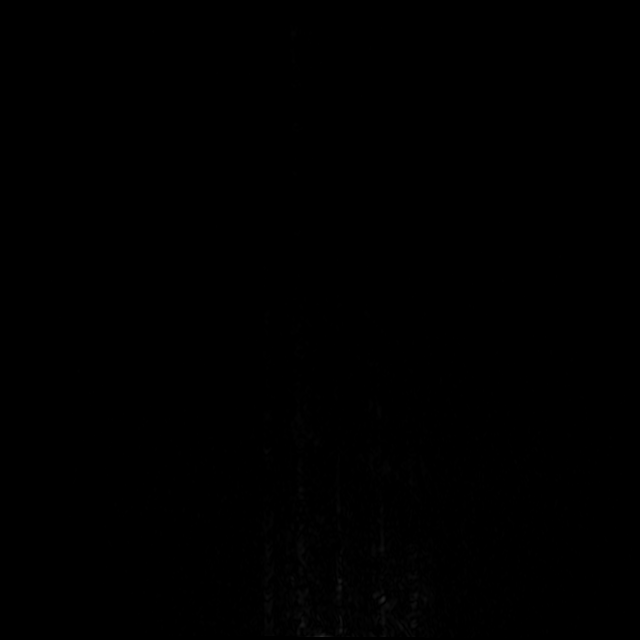

[Series 4: T2 fat-sat · axial · left · 3.0mm · 0.31mm/px · z∈[-35,+77]mm · 3 of 30 slices shown (2 of 2)]
[im 1/30]
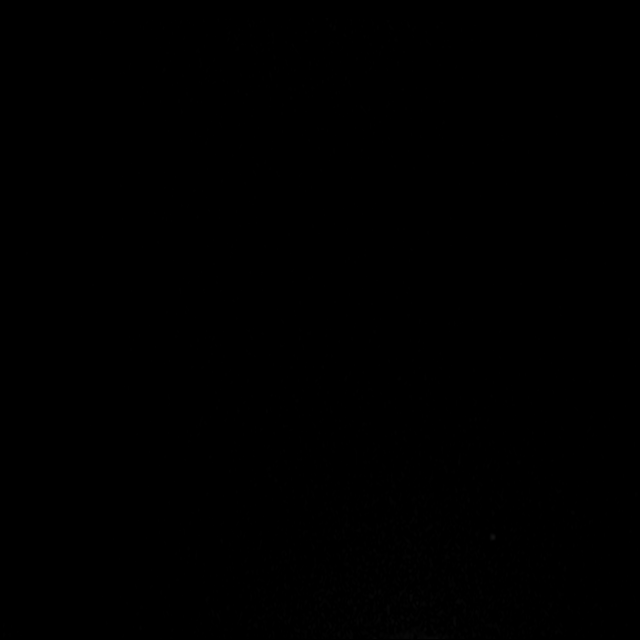
[im 20/30]
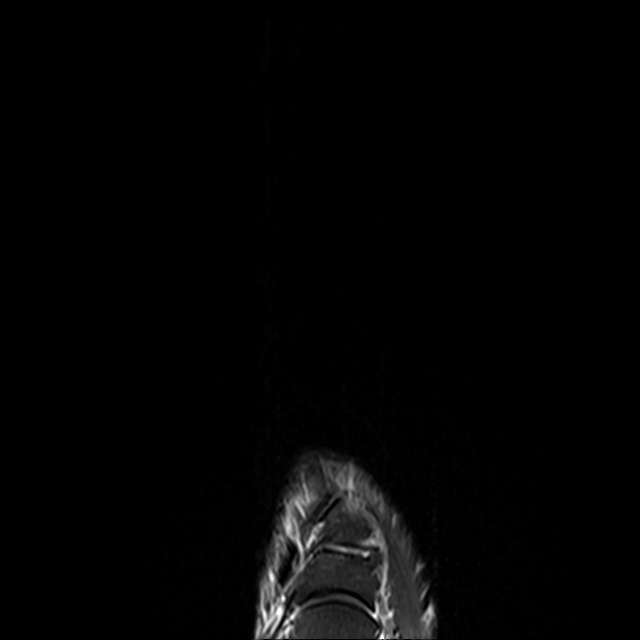
[im 30/30]
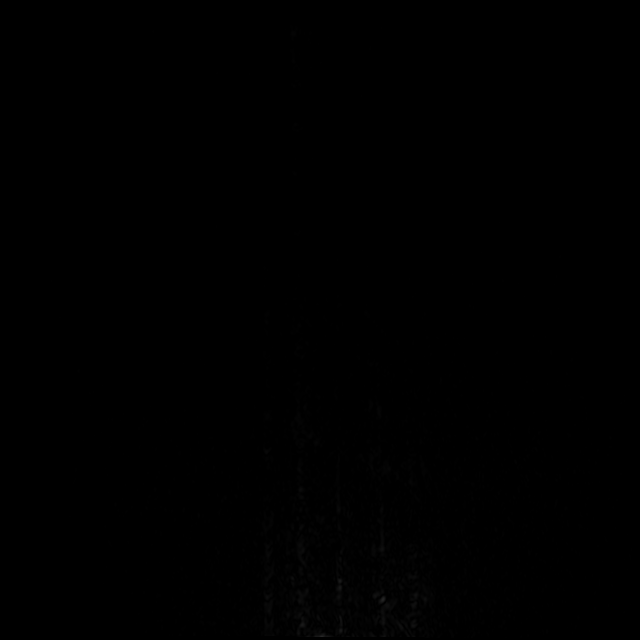

[Series 8: PD fat-sat · coronal · left · 3.0mm · 0.31mm/px · 3 of 50 slices shown (2 of 2)]
[im 9/50]
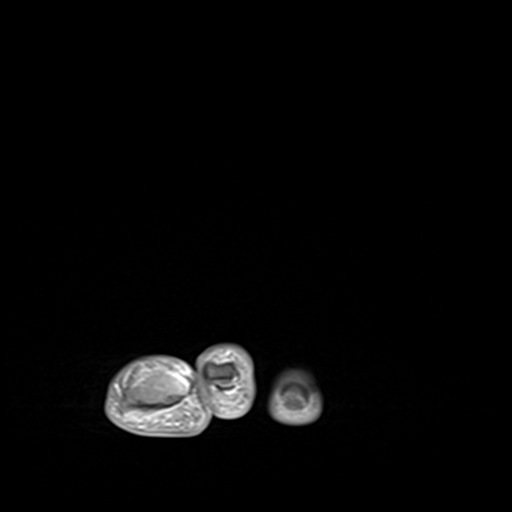
[im 25/50]
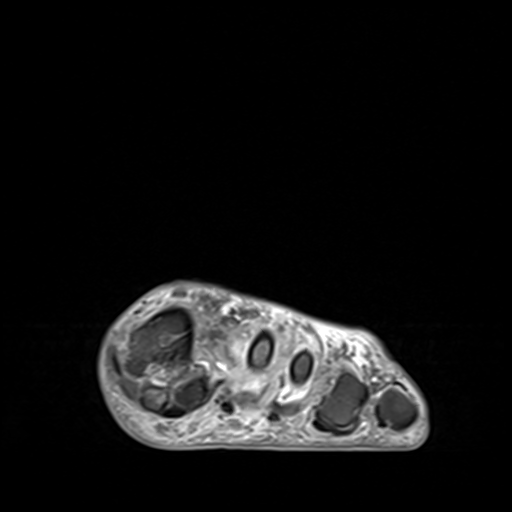
[im 41/50]
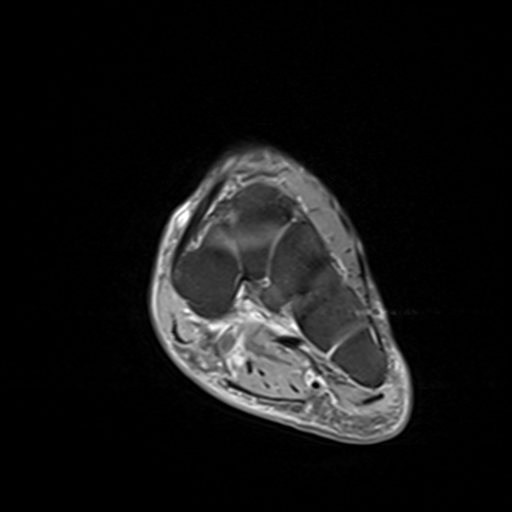

[13 of 40 positions shown; findings below may reference images not displayed]

FINDINGS: Bones/Joint/Cartilage

Patchy edema signal in the medial sesamoid of the first digit and in
the adjacent first metatarsal head on image 28 series 7, potentially
with some intermetatarsal edema and edema along the sesamoid
connection of the medial metatarsosesamoid ligament for example on
image 28 series 7. Degenerative focal subcortical marrow edema along
the first metatarsal head on image 7 series 6 with degenerative
spurring of the first MTP joint. There is suspected tearing of the
first MTP joint plantar plate on image 9 series 6 and potentially
proximally on image 8 series 6.

Substantial abnormal marrow edema in the proximal half of the
proximal phalanx of the second digit, and in the distal half of the
second metatarsal as well as indistinctness of the plantar capsular
tissues just lateral to the flexor tendon, with poor delineation of
the lateral portion of the plantar plate and some poor definition of
the flexor digitorum brevis tendon. Injury of the plantar plate is a
distinct possibility given the indistinctness of plantar tissues in
this region.

Ligaments

Lisfranc ligament appears intact. As noted above, there is abnormal
edema signal along the sesamoid attachment of the medial
metatarsosesamoid ligament.

Muscles and Tendons

Tendinopathy of the second digit flexor digitorum brevis tendon.
Suspected mild distal tibialis posterior tendinopathy.

Soft tissues

Mild subcutaneous edema tracking along the dorsum of the foot and
into the toes.
IMPRESSION: 1. Suspected plantar plate injury of the second MTP joint with
abnormal edema distally in the second metatarsal and proximally in
the proximal phalanx along with indistinctness of the plantar
capsular structures including the plantar plate and flexor digitorum
brevis.
2. Degenerative findings the first MTP joint and between the medial
sesamoid and first metatarsal head, along with suspected partial
tearing of the sesamoid attachment of the medial metatarsosesamoid
ligament, as well as indistinctness of the first digit plantar plate
with some fluid signal intensity in the expected location concerning
for potential turf toe injury.
3. Suspected mild distal tibialis posterior tendinopathy, although
today's exam was not optimized to assess the tibialis posterior.

## 2023-07-21 ENCOUNTER — Emergency Department (HOSPITAL_COMMUNITY): Payer: Medicare HMO

## 2023-07-21 ENCOUNTER — Emergency Department (HOSPITAL_COMMUNITY)
Admission: EM | Admit: 2023-07-21 | Discharge: 2023-07-22 | Discharge status: Against Medical Advice | Payer: Medicare HMO | Attending: Emergency Medicine | Admitting: Emergency Medicine

## 2023-07-21 ENCOUNTER — Other Ambulatory Visit: Payer: Self-pay

## 2023-07-21 ENCOUNTER — Encounter (HOSPITAL_COMMUNITY): Payer: Self-pay

## 2023-07-21 DIAGNOSIS — S61214A Laceration without foreign body of right ring finger without damage to nail, initial encounter: Secondary | ICD-10-CM | POA: Insufficient documentation

## 2023-07-21 DIAGNOSIS — X58XXXA Exposure to other specified factors, initial encounter: Secondary | ICD-10-CM | POA: Diagnosis not present

## 2023-07-21 DIAGNOSIS — S6991XA Unspecified injury of right wrist, hand and finger(s), initial encounter: Secondary | ICD-10-CM | POA: Diagnosis not present

## 2023-07-21 DIAGNOSIS — M19041 Primary osteoarthritis, right hand: Secondary | ICD-10-CM | POA: Diagnosis not present

## 2023-07-21 DIAGNOSIS — Z5321 Procedure and treatment not carried out due to patient leaving prior to being seen by health care provider: Secondary | ICD-10-CM | POA: Diagnosis not present

## 2023-07-21 NOTE — ED Triage Notes (Signed)
Pt arrived from home via POV c/o laceration to tip of right ring finger. Pt could not bleeding to stop. Bleeding controlled at present in triage with pressure dressing on.

## 2023-07-22 NOTE — ED Notes (Signed)
Pt left AMA

## 2023-09-08 ENCOUNTER — Encounter: Payer: Self-pay | Admitting: Internal Medicine

## 2023-09-08 ENCOUNTER — Ambulatory Visit (INDEPENDENT_AMBULATORY_CARE_PROVIDER_SITE_OTHER): Payer: Medicare HMO | Admitting: Internal Medicine

## 2023-09-08 VITALS — BP 136/70 | HR 73 | Temp 97.0°F | Resp 16 | Ht 73.5 in | Wt 206.2 lb

## 2023-09-08 DIAGNOSIS — R7309 Other abnormal glucose: Secondary | ICD-10-CM | POA: Diagnosis not present

## 2023-09-08 DIAGNOSIS — E782 Mixed hyperlipidemia: Secondary | ICD-10-CM | POA: Diagnosis not present

## 2023-09-08 DIAGNOSIS — E559 Vitamin D deficiency, unspecified: Secondary | ICD-10-CM | POA: Diagnosis not present

## 2023-09-08 DIAGNOSIS — Z8249 Family history of ischemic heart disease and other diseases of the circulatory system: Secondary | ICD-10-CM

## 2023-09-08 DIAGNOSIS — Z79899 Other long term (current) drug therapy: Secondary | ICD-10-CM | POA: Diagnosis not present

## 2023-09-08 DIAGNOSIS — Z87891 Personal history of nicotine dependence: Secondary | ICD-10-CM

## 2023-09-08 DIAGNOSIS — N401 Enlarged prostate with lower urinary tract symptoms: Secondary | ICD-10-CM | POA: Diagnosis not present

## 2023-09-08 DIAGNOSIS — R0989 Other specified symptoms and signs involving the circulatory and respiratory systems: Secondary | ICD-10-CM

## 2023-09-08 DIAGNOSIS — I700 Atherosclerosis of aorta: Secondary | ICD-10-CM | POA: Diagnosis not present

## 2023-09-08 DIAGNOSIS — Z0001 Encounter for general adult medical examination with abnormal findings: Secondary | ICD-10-CM

## 2023-09-08 DIAGNOSIS — Z136 Encounter for screening for cardiovascular disorders: Secondary | ICD-10-CM | POA: Diagnosis not present

## 2023-09-08 DIAGNOSIS — Z0000 Encounter for general adult medical examination without abnormal findings: Secondary | ICD-10-CM

## 2023-09-08 DIAGNOSIS — Z1211 Encounter for screening for malignant neoplasm of colon: Secondary | ICD-10-CM

## 2023-09-08 DIAGNOSIS — Z125 Encounter for screening for malignant neoplasm of prostate: Secondary | ICD-10-CM | POA: Diagnosis not present

## 2023-09-08 DIAGNOSIS — N138 Other obstructive and reflux uropathy: Secondary | ICD-10-CM

## 2023-09-08 NOTE — Patient Instructions (Signed)

## 2023-09-08 NOTE — Progress Notes (Signed)
Annual  Screening/Preventative Visit  & Comprehensive Evaluation & Examination  Future Appointments  Date Time Provider Department  09/08/2023                         cpe  2:00 PM Lucky Cowboy, MD [facility]  12/13/2023                         wellness  2:00 PM Raynelle Dick, NP [facility]  09/24/2024                       cpe  2:00 PM Lucky Cowboy, MD [facility]         This very nice 67 y.o. MWM  with   labile HTN, HLD, GERD, Prediabetes and Vitamin D Deficiency  presents for a Screening /Preventative Visit & comprehensive evaluation and management of multiple medical co-morbidities.         Labile HTN predates since  2003. Patient's BP has been controlled at home.  Today's BP is at goal - 136/70 . Patient denies any cardiac symptoms as chest pain, palpitations, shortness of breath, dizziness or ankle swelling.       Patient's hyperlipidemia is controlled with diet . Last lipids were at goal :  Lab Results  Component Value Date   CHOL 157 12/29/2022   HDL 60 12/29/2022   LDLCALC 76 12/29/2022   TRIG 129 12/29/2022   CHOLHDL 2.6 12/29/2022         Patient is monitored expectantly for glucose intolerance  and patient denies reactive hypoglycemic symptoms, visual blurring, diabetic polys or paresthesias. Last A1c was normal & at goal :  Lab Results  Component Value Date   HGBA1C 5.4 09/02/2022          Finally, patient has history of Vitamin D Deficiency ("44"/2008) and last vitamin D was at goal :   Lab Results  Component Value Date   VD25OH 71 09/02/2022      Current Outpatient Medications  Medication Instructions   BIOTIN     Calcium  600  1 tablet Daily   VITAMIN D    2 times daily 5,000 units   clobetasol ointment 0.05 % APPLY  TWICE A DAY   fexofenadine  180 mg, Oral, Daily   FLONASE  nasal spray 2 SPRAYS  EACH NOSTRIL EVERY DAY   Magnesium 500 MG TABS 1 tablet  Daily   Multiple Vitamins-Minerals  1 tablet,Daily   Saw Palmetto  2 tabs daily     zinc 50 mg Daily     Allergies  Allergen Reactions   Wasp Venom Anaphylaxis    Wasp/Hornet/Bee   Androgel [Testosterone] Other (See Comments)    Headaches     Past Medical History:  Diagnosis Date   Allergy    GERD (gastroesophageal reflux disease)    Hx of adenomatous polyp of colon 10/27/2016   Labile hypertension    Other testicular hypofunction    Psoriasis      Health Maintenance  Topic Date Due   Zoster Vaccines- Shingrix (1 of 2) Never done   Pneumonia Vaccine 6+ Years old (2 - PCV) 11/01/2009   COVID-19 Vaccine (3 - Pfizer risk series) 03/17/2020   INFLUENZA VACCINE  06/01/2021   COLONOSCOPY (Pts 45-65yrs Insurance coverage will need to be confirmed)  10/28/2024   TETANUS/TDAP  06/21/2027   Hepatitis C Screening  Completed   HIV  Screening  Completed   HPV VACCINES  Aged Out     Immunization History  Administered Date(s) Administered   Influenza Inj Mdck Quad 08/23/2018, 09/03/2019, 08/27/2020   Influenza Split 07/19/2013, 07/29/2014, 07/31/2015   Influenza,inj,quad  08/26/2016   PFIZER SARS-COV-2 Vacc 01/22/2020, 02/18/2020   PPD Test 07/11/2018, 07/30/2019, 08/27/2020   Pneumococcal 23 11/01/2008   Td 11/01/2006, 06/20/2017   Zoster, Live 08/26/2016    Last Colon - 10/29/2019 - Dr Leone Payor - Recc 5 y f/u - due Dec 2025   Past Surgical History:  Procedure Laterality Date   COLONOSCOPY  10/2016   COLONOSCOPY N/A 10/2016   HEMORRHOID BANDING  04/2011   HERNIA REPAIR Right 1958   INSERTION OF MESH N/A 08/30/2014   Procedure: INSERTION OF MESH;  Surgeon: Karie Soda, MD;  Location: [facility] OR;  Service: General;  Laterality: N/A;   LAPAROSCOPIC INGUINAL HERNIA WITH UMBILICAL HERNIA N/A 08/30/2014   Procedure: LAPAROSCOPIC EXPLORATION AND REPAIR OF BILATERAL HERNIAS IN  GROINS AND UMBILICAL HERNIA REPAIR.;  Surgeon: Karie Soda, MD;  Location: [facility] OR;  Service: General;  Laterality: N/A;   POLYPECTOMY       Family History  Problem Relation Age of  Onset   Hypertension Mother    Colon polyps Mother    Cancer Mother        Uterine   Uterine cancer Mother    COPD Father    Arthritis Father    Cancer Father        Prostate   Prostate cancer Father    Colon cancer Neg Hx    Esophageal cancer Neg Hx    Rectal cancer Neg Hx    Stomach cancer Neg Hx      Social History   Married - Spouse name - Hessie Diener. No children Tobacco Use   Smoking status: Former    Types: Cigarettes    Quit date: 09/28/1996    Years since quitting: 24.9   Smokeless tobacco: Never  Substance Use Topics   Alcohol use: Yes    Alcohol/week: 14.0 standard drinks    Types: 14 Glasses of wine per week    Comment: daily  wine   Drug use: No      ROS Constitutional: Denies fever, chills, weight loss/gain, headaches, insomnia,  night sweats or change in appetite. Does c/o fatigue. Eyes: Denies redness, blurred vision, diplopia, discharge, itchy or watery eyes.  ENT: Denies discharge, congestion, post nasal drip, epistaxis, sore throat, earache, hearing loss, dental pain, Tinnitus, Vertigo, Sinus pain or snoring.  Cardio: Denies chest pain, palpitations, irregular heartbeat, syncope, dyspnea, diaphoresis, orthopnea, PND, claudication or edema Respiratory: denies cough, dyspnea, DOE, pleurisy, hoarseness, laryngitis or wheezing.  Gastrointestinal: Denies dysphagia, heartburn, reflux, water brash, pain, cramps, nausea, vomiting, bloating, diarrhea, constipation, hematemesis, melena, hematochezia, jaundice or hemorrhoids Genitourinary: Denies dysuria, frequency, urgency, nocturia, hesitancy, discharge, hematuria or flank pain Musculoskeletal: Denies arthralgia, myalgia, stiffness, Jt. Swelling, pain, limp or strain/sprain. Denies Falls. Skin: Denies puritis, rash, hives, warts, acne, eczema or change in skin lesion Neuro: No weakness, tremor, incoordination, spasms, paresthesia or pain Psychiatric: Denies confusion, memory loss or sensory loss. Denies  Depression. Endocrine: Denies change in weight, skin, hair change, nocturia, and paresthesia, diabetic polys, visual blurring or hyper / hypo glycemic episodes.  Heme/Lymph: No excessive bleeding, bruising or enlarged lymph nodes.   Physical Exam  BP 136/70   Pulse 73   Temp (!) 97 F (36.1 C)   Resp 16   Ht 6' 1.5" (1.867 m)  Wt 206 lb 3.2 oz (93.5 kg)   SpO2 97%   BMI 26.84 kg/m   General Appearance: Well nourished and well groomed and in no apparent distress.  Eyes: PERRLA, EOMs, conjunctiva no swelling or erythema, normal fundi and vessels. Sinuses: No frontal/maxillary tenderness ENT/Mouth: EACs patent / TMs  nl. Nares clear without erythema, swelling, mucoid exudates. Oral hygiene is good. No erythema, swelling, or exudate. Tongue normal, non-obstructing. Tonsils not swollen or erythematous. Hearing normal.  Neck: Supple, thyroid not palpable. No bruits, nodes or JVD. Respiratory: Respiratory effort normal.  BS equal and clear bilateral without rales, rhonci, wheezing or stridor. Cardio: Heart sounds are normal with regular rate and rhythm and no murmurs, rubs or gallops. Peripheral pulses are normal and equal bilaterally without edema. No aortic or femoral bruits. Chest: symmetric with normal excursions and percussion.  Abdomen: Soft, with Nl bowel sounds. Nontender, no guarding, rebound, hernias, masses, or organomegaly.  Lymphatics: Non tender without lymphadenopathy.  Musculoskeletal: Full ROM all peripheral extremities, joint stability, 5/5 strength, and normal gait. Skin: Warm and dry without rashes, lesions, cyanosis, clubbing or  ecchymosis.  Neuro: Cranial nerves intact, reflexes equal bilaterally. Normal muscle tone, no cerebellar symptoms. Sensation intact.  Pysch: Alert and oriented x 3 with normal affect, insight and judgment appropriate.   Assessment and Plan  1. Annual Preventative/Screening Exam    2. Labile hypertension  - EKG 12-Lead - [place],  RETROPERITNL ABD,  LTD - Urinalysis, Routine w reflex microscopic - Microalbumin / creatinine urine ratio - CBC with Differential/Platelet - COMPLETE METABOLIC PANEL WITH GFR - Magnesium - TSH  3. Hyperlipidemia, mixed  - EKG 12-Lead - [place], RETROPERITNL ABD,  LTD - Lipid panel - TSH  4. Abnormal glucose  - EKG 12-Lead - [place], RETROPERITNL ABD,  LTD - Hemoglobin A1c - Insulin, random  5. Vitamin D deficiency  - VITAMIN D 25 Hydroxy (  6. BPH with obstruction/lower urinary tract symptoms  - PSA  7. Screening for colorectal cancer  - POC Hemoccult Bld/Stl  8. Prostate cancer screening  - PSA  9. Screening for heart disease  - EKG 12-Lead  10. FH: hypertension  - EKG 12-Lead - [place], RETROPERITNL ABD,  LTD  11. Former smoker  - EKG 12-Lead - [place], RETROPERITNL ABD,  LTD  12. Screening for AAA (aortic abdominal aneurysm)  - [place], RETROPERITNL ABD,  LTD  13. Medication management  - Urinalysis, Routine w reflex microscopic - Microalbumin / creatinine urine ratio - COMPLETE METABOLIC PANEL WITH GFR - Magnesium - Lipid panel - TSH - Hemoglobin A1c - Insulin, random - VITAMIN D 25 Hydroxy           Patient was counseled in prudent diet, weight control to achieve/maintain BMI less than 25, BP monitoring, regular exercise and medications as discussed.  Discussed med effects and SE's. Routine screening labs and tests as requested with regular follow-up as recommended. Over 40 minutes of exam, counseling, chart review and high complex critical decision making was performed   Marinus Maw, MD

## 2023-09-09 ENCOUNTER — Other Ambulatory Visit: Payer: Self-pay | Admitting: Internal Medicine

## 2023-09-09 LAB — COMPLETE METABOLIC PANEL WITH GFR
AG Ratio: 1.9 (calc) (ref 1.0–2.5)
ALT: 32 U/L (ref 9–46)
AST: 22 U/L (ref 10–35)
Albumin: 4.6 g/dL (ref 3.6–5.1)
Alkaline phosphatase (APISO): 101 U/L (ref 35–144)
BUN: 20 mg/dL (ref 7–25)
CO2: 24 mmol/L (ref 20–32)
Calcium: 9.8 mg/dL (ref 8.6–10.3)
Chloride: 104 mmol/L (ref 98–110)
Creat: 0.7 mg/dL (ref 0.70–1.35)
Globulin: 2.4 g/dL (ref 1.9–3.7)
Glucose, Bld: 93 mg/dL (ref 65–99)
Potassium: 4.6 mmol/L (ref 3.5–5.3)
Sodium: 138 mmol/L (ref 135–146)
Total Bilirubin: 0.7 mg/dL (ref 0.2–1.2)
Total Protein: 7 g/dL (ref 6.1–8.1)
eGFR: 101 mL/min/{1.73_m2} (ref >=60)

## 2023-09-09 LAB — HEMOGLOBIN A1C
Hgb A1c MFr Bld: 5.3 %{Hb} (ref <5.7)
Mean Plasma Glucose: 105 mg/dL
eAG (mmol/L): 5.8 mmol/L

## 2023-09-09 LAB — URINALYSIS, ROUTINE W REFLEX MICROSCOPIC
Bilirubin Urine: NEGATIVE
Glucose, UA: NEGATIVE
Hgb urine dipstick: NEGATIVE
Ketones, ur: NEGATIVE
Leukocytes,Ua: NEGATIVE
Nitrite: NEGATIVE
Protein, ur: NEGATIVE
Specific Gravity, Urine: 1.012 (ref 1.001–1.035)
pH: 5.5 (ref 5.0–8.0)

## 2023-09-09 LAB — MICROALBUMIN / CREATININE URINE RATIO
Creatinine, Urine: 50 mg/dL (ref 20–320)
Microalb, Ur: <0.2 mg/dL

## 2023-09-09 LAB — CBC WITH DIFFERENTIAL/PLATELET
Absolute Lymphocytes: 2102 {cells}/uL (ref 850–3900)
Absolute Monocytes: 655 {cells}/uL (ref 200–950)
Basophils Absolute: 77 {cells}/uL (ref 0–200)
Basophils Relative: 1 %
Eosinophils Absolute: 231 {cells}/uL (ref 15–500)
Eosinophils Relative: 3 %
HCT: 44.7 % (ref 38.5–50.0)
Hemoglobin: 15.1 g/dL (ref 13.2–17.1)
MCH: 30.3 pg (ref 27.0–33.0)
MCHC: 33.8 g/dL (ref 32.0–36.0)
MCV: 89.8 fL (ref 80.0–100.0)
MPV: 10.5 fL (ref 7.5–12.5)
Monocytes Relative: 8.5 %
Neutro Abs: 4635 {cells}/uL (ref 1500–7800)
Neutrophils Relative %: 60.2 %
Platelets: 229 10*3/uL (ref 140–400)
RBC: 4.98 10*6/uL (ref 4.20–5.80)
RDW: 12.9 % (ref 11.0–15.0)
Total Lymphocyte: 27.3 %
WBC: 7.7 10*3/uL (ref 3.8–10.8)

## 2023-09-09 LAB — LIPID PANEL
Cholesterol: 169 mg/dL (ref <200)
HDL: 68 mg/dL (ref >=40)
LDL Cholesterol (Calc): 83 mg/dL
Non-HDL Cholesterol (Calc): 101 mg/dL (ref <130)
Total CHOL/HDL Ratio: 2.5 (calc) (ref <5.0)
Triglycerides: 90 mg/dL (ref <150)

## 2023-09-09 LAB — TSH: TSH: 3.51 m[IU]/L (ref 0.40–4.50)

## 2023-09-09 LAB — MAGNESIUM: Magnesium: 1.9 mg/dL (ref 1.5–2.5)

## 2023-09-09 LAB — INSULIN, RANDOM: Insulin: 8.9 u[IU]/mL

## 2023-09-09 LAB — PSA: PSA: 2.33 ng/mL (ref <=4.00)

## 2023-09-09 LAB — VITAMIN D 25 HYDROXY (VIT D DEFICIENCY, FRACTURES): Vit D, 25-Hydroxy: 83 ng/mL (ref 30–100)

## 2023-09-09 MED ORDER — CLOBETASOL PROPIONATE 0.05 % EX OINT: TOPICAL_OINTMENT | CUTANEOUS | 3 refills | Status: DC

## 2023-09-10 NOTE — Progress Notes (Signed)
<>*<>*<>*<>*<>*<>*<>*<>*<>*<>*<>*<>*<>*<>*<>*<>*<>*<>*<>*<>*<>*<>*<>*<>*<> <>*<>*<>*<>*<>*<>*<>*<>*<>*<>*<>*<>*<>*<>*<>*<>*<>*<>*<>*<>*<>*<>*<>*<>*<>  -  Test results slightly outside the reference range are not unusual. If there is anything important, I will review this with you,  otherwise it is considered normal test values.  If you have further questions,  please do not hesitate to contact me at the office or via My Chart.   <>*<>*<>*<>*<>*<>*<>*<>*<>*<>*<>*<>*<>*<>*<>*<>*<>*<>*<>*<>*<>*<>*<>*<>*<> <>*<>*<>*<>*<>*<>*<>*<>*<>*<>*<>*<>*<>*<>*<>*<>*<>*<>*<>*<>*<>*<>*<>*<>*<>  - PSA - very low - No Prostate Cancer  - Great  !   <>*<>*<>*<>*<>*<>*<>*<>*<>*<>*<>*<>*<>*<>*<>*<>*<>*<>*<>*<>*<>*<>*<>*<>*<> <>*<>*<>*<>*<>*<>*<>*<>*<>*<>*<>*<>*<>*<>*<>*<>*<>*<>*<>*<>*<>*<>*<>*<>*<>  - Chol = 169    Excellent   - Very low risk for Heart Attack  / Stroke  <>*<>*<>*<>*<>*<>*<>*<>*<>*<>*<>*<>*<>*<>*<>*<>*<>*<>*<>*<>*<>*<>*<>*<>*<> <>*<>*<>*<>*<>*<>*<>*<>*<>*<>*<>*<>*<>*<>*<>*<>*<>*<>*<>*<>*<>*<>*<>*<>*<>  -  A1c - Normal - No Diabetes  - Great !   <>*<>*<>*<>*<>*<>*<>*<>*<>*<>*<>*<>*<>*<>*<>*<>*<>*<>*<>*<>*<>*<>*<>*<>*<> <>*<>*<>*<>*<>*<>*<>*<>*<>*<>*<>*<>*<>*<>*<>*<>*<>*<>*<>*<>*<>*<>*<>*<>*<>  - Vitamin D = 83  - Excellent  - Please keep dosage  same   <>*<>*<>*<>*<>*<>*<>*<>*<>*<>*<>*<>*<>*<>*<>*<>*<>*<>*<>*<>*<>*<>*<>*<>*<> <>*<>*<>*<>*<>*<>*<>*<>*<>*<>*<>*<>*<>*<>*<>*<>*<>*<>*<>*<>*<>*<>*<>*<>*<>  All Else - CBC - Kidneys - Electrolytes - Liver - Magnesium & Thyroid    - all  Normal / OK  <>*<>*<>*<>*<>*<>*<>*<>*<>*<>*<>*<>*<>*<>*<>*<>*<>*<>*<>*<>*<>*<>*<>*<>*<> <>*<>*<>*<>*<>*<>*<>*<>*<>*<>*<>*<>*<>*<>*<>*<>*<>*<>*<>*<>*<>*<>*<>*<>*<>

## 2023-09-11 ENCOUNTER — Encounter: Payer: Self-pay | Admitting: Internal Medicine

## 2023-10-06 ENCOUNTER — Ambulatory Visit: Payer: Medicare HMO | Admitting: Nurse Practitioner

## 2023-10-06 ENCOUNTER — Other Ambulatory Visit: Payer: Self-pay

## 2023-10-06 ENCOUNTER — Encounter: Payer: Self-pay | Admitting: Nurse Practitioner

## 2023-10-06 VITALS — BP 140/76 | HR 71 | Temp 97.8°F | Ht 73.5 in | Wt 208.2 lb

## 2023-10-06 DIAGNOSIS — R051 Acute cough: Secondary | ICD-10-CM | POA: Diagnosis not present

## 2023-10-06 DIAGNOSIS — J069 Acute upper respiratory infection, unspecified: Secondary | ICD-10-CM | POA: Diagnosis not present

## 2023-10-06 DIAGNOSIS — J040 Acute laryngitis: Secondary | ICD-10-CM

## 2023-10-06 MED ORDER — PROMETHAZINE-DM 6.25-15 MG/5ML PO SYRP
5.0000 mL | ORAL_SOLUTION | Freq: Four times a day (QID) | ORAL | 0 refills | Status: AC | PRN
Start: 2023-10-06 — End: ?

## 2023-10-06 MED ORDER — AZITHROMYCIN 250 MG PO TABS
ORAL_TABLET | ORAL | 1 refills | Status: DC
Start: 2023-10-06 — End: 2023-12-29

## 2023-10-06 MED ORDER — PREDNISONE 10 MG PO TABS
ORAL_TABLET | ORAL | 0 refills | Status: DC
Start: 2023-10-06 — End: 2024-03-10

## 2023-10-06 NOTE — Progress Notes (Signed)
Assessment and Plan:  Christopher Lawrence was seen today for an episodic visit.  Diagnoses and all order for this visit:  Upper respiratory tract infection, unspecified type Start tmt with Azithromycin - take in full as directed. Start steroid taper as directed. Continue antihistamine Allegra Stay well hydrated to keep mucus thin and productive  - azithromycin (ZITHROMAX) 250 MG tablet; Take 2 tablets on  Day 1,  followed by 1 tablet  daily for 4 more days    for Sinusitis  /Bronchitis  Dispense: 6 each; Refill: 1 - predniSONE (DELTASONE) 10 MG tablet; 1 tab 3 x day for 2 days, then 1 tab 2 x day for 2 days, then 1 tab 1 x day for 3 days  Dispense: 13 tablet; Refill: 0  Acute cough Start Prometazine cough syrup as direced. Stay well hydrated to keep any mucus thin an d productive. A cough can linger for 3 weeks. Watch for any changes in your cough and contact office if noticed including blood, pus, pain, night sweats. Cover your mouth when you cough. If the air is dry, use a cool mist vaporizer or humidifier in your home. If your cough is worse at night, try using extra pillows to raise your head up higher while you sleep. Call 911 or report to ER if you start to have difficulty breathing.   - promethazine-dextromethorphan (PROMETHAZINE-DM) 6.25-15 MG/5ML syrup; Take 5 mLs by mouth 4 (four) times daily as needed for cough.  Dispense: 240 mL; Refill: 0  Laryngitis Warm salt water gargles several times throughout the day Warm liquids such as hot tea, honey, lemon, ginger OTC Chloraseptic spray and/or soothing throat lozenges.   Notify office for further evaluation and treatment, questions or concerns if s/s fail to improve. The risks and benefits of my recommendations, as well as other treatment options were discussed with the patient today. Questions were answered.  Further disposition pending results of labs. Discussed med's effects and SE's.    Over 20 minutes of exam, counseling,  chart review, and critical decision making was performed.   Future Appointments  Date Time Provider Department Center  10/18/2023  1:45 PM [place], [place], [place] [facility]  12/13/2023  2:00 PM Raynelle Dick, NP [facility] None  09/24/2024  2:00 PM Lucky Cowboy, MD [facility] None    ------------------------------------------------------------------------------------------------------------------   HPI BP (!) 140/76   Pulse 71   Temp 97.8 F (36.6 C)   Ht 6' 1.5" (1.867 m)   Wt 208 lb 3.2 oz (94.4 kg)   SpO2 98%   BMI 27.10 kg/m    Patient complains of symptoms of a URI, possible sinusitis. Symptoms include achiness, congestion, cough described as productive, low grade fever, nasal congestion, post nasal drip, sneezing, and sore throat and laryngitis, body aches. Onset of symptoms was 1 week ago, and has been unchanged since that time. Treatment to date: antihistamines.  He does not feel as though he has had any recent exposure to sickness.  Unable to sleep at  night due to cough.   Past Medical History:  Diagnosis Date   Allergy    GERD (gastroesophageal reflux disease)    Hx of adenomatous polyp of colon 10/27/2016   Labile hypertension    Other testicular hypofunction    Psoriasis      Allergies  Allergen Reactions   Wasp Venom Anaphylaxis    Wasp/Hornet/Bee   Androgel [Testosterone] Other (See Comments)    Headaches    Current Outpatient Medications on File  Prior to Visit  Medication Sig   BIOTIN PO Take by mouth.   Calcium Carbonate (CALCIUM 600 PO) Take 1 tablet by mouth daily.   Cholecalciferol (VITAMIN D PO) Take 2,000 Units by mouth 2 (two) times daily. 5,000 units   clobetasol ointment (TEMOVATE) 0.05 % APPLY TO AFFECTED AREA TWICE A DAY   fexofenadine (ALLEGRA) 180 MG tablet Take 1 tablet (180 mg total) by mouth daily.   fluticasone (FLONASE) 50 MCG/ACT nasal spray SPRAY 2 SPRAYS INTO EACH NOSTRIL EVERY DAY   Magnesium 500 MG TABS Take 1  tablet by mouth daily.    Multiple Vitamins-Minerals (MULTIVITAMIN PO) Take 1 tablet by mouth daily.    OVER THE COUNTER MEDICATION Saw Palmetto 2 tabs daily   zinc gluconate 50 MG tablet Take 50 mg by mouth daily.   No current facility-administered medications on file prior to visit.    ROS: all negative except what is noted in the HPI.   Physical Exam:  BP (!) 140/76   Pulse 71   Temp 97.8 F (36.6 C)   Ht 6' 1.5" (1.867 m)   Wt 208 lb 3.2 oz (94.4 kg)   SpO2 98%   BMI 27.10 kg/m   General Appearance: NAD.  Awake, conversant and cooperative. Eyes: PERRLA, EOMs intact.  Sclera white.  Conjunctiva without erythema. Sinuses: Frontal/maxillary tenderness.  No nasal discharge. Nares patent.  ENT/Mouth: Ext aud canals clear.  Bilateral TMs w/DOL and without erythema or bulging. Hearing intact.  Posterior pharynx without swelling or exudate.  Tonsils without swelling or erythema.  Neck: Supple.  No masses, nodules or thyromegaly. Respiratory: Effort is regular with non-labored breathing. Breath sounds are equal bilaterally without rales, rhonchi, wheezing or stridor.  Cardio: RRR with no MRGs. Brisk peripheral pulses without edema.  Abdomen: Active BS in all four quadrants.  Soft and non-tender without guarding, rebound tenderness, hernias or masses. Lymphatics: Non tender without lymphadenopathy.  Musculoskeletal: Full ROM, 5/5 strength, normal ambulation.  No clubbing or cyanosis. Skin: Appropriate color for ethnicity. Warm without rashes, lesions, ecchymosis, ulcers.  Neuro: CN II-XII grossly normal. Normal muscle tone without cerebellar symptoms and intact sensation.   Psych: AO X 3,  appropriate mood and affect, insight and judgment.     Adela Glimpse, NP 11:05 AM [facility]

## 2023-10-06 NOTE — Patient Instructions (Signed)

## 2023-10-18 ENCOUNTER — Ambulatory Visit: Payer: Medicare HMO | Admitting: Podiatry

## 2023-10-18 DIAGNOSIS — G5782 Other specified mononeuropathies of left lower limb: Secondary | ICD-10-CM

## 2023-10-18 MED ORDER — TRIAMCINOLONE ACETONIDE 40 MG/ML IJ SUSP
20.0000 mg | Freq: Once | INTRAMUSCULAR | Status: AC
Start: 2023-10-18 — End: 2023-10-18
  Administered 2023-10-18: 20 mg

## 2023-10-18 NOTE — Progress Notes (Signed)
He presents today concerned about his third toe left foot wants to know if is supposed to look like this.  He is also complaining of pain radiating equally between the fourth and fifth digits of the left foot but only at nighttime.  Denies any trauma.  Objective: Vitals are stable he is alert oriented x 3.  Pulses are palpable.  Third toe does demonstrate some medial rotation almost beneath the second toe though that it was a fused toe I do believe this was the one of the pins that came out earlier I was backing out.  However he does have a toenail that appears to have been injured and is coming off.  I went ahead and trimmed it off today completely there is no iatrogenic lesions and was not bleeding did not appear to be infected.  He does have pain on palpation to the fourth interdigital space of the left foot.  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  And trauma to the third nail left foot.  Plan: Debrided the nail from the third digit left foot.  I also injected the fourth interdigital space with 10 mg Kenalog 5 mg Marcaine point maximal tenderness we will follow-up with him in 6 weeks if necessary.

## 2023-11-29 ENCOUNTER — Ambulatory Visit (INDEPENDENT_AMBULATORY_CARE_PROVIDER_SITE_OTHER): Payer: PPO | Admitting: Podiatry

## 2023-11-29 ENCOUNTER — Encounter: Payer: Self-pay | Admitting: Podiatry

## 2023-11-29 DIAGNOSIS — G5782 Other specified mononeuropathies of left lower limb: Secondary | ICD-10-CM | POA: Diagnosis not present

## 2023-11-29 MED ORDER — TRIAMCINOLONE ACETONIDE 40 MG/ML IJ SUSP
20.0000 mg | Freq: Once | INTRAMUSCULAR | Status: AC
Start: 2023-11-29 — End: 2023-11-29
  Administered 2023-11-29: 20 mg

## 2023-11-30 NOTE — Progress Notes (Signed)
He presents today for follow-up of a neuroma to the fourth interdigital space of the left foot.  States this 1 is completely fine at this point.  Him having no pain in it on the left 1 and third interdigital space is painful as he points to the area.  Objective: Vital signs are stable he is alert oriented x 3 no reproducible pain on palpation to the fourth interdigital space at this time proximally or distally.  He does have a palpable Mulder's click to the third interdigital space of the left foot with significant discomfort.  Assessment: Resolving neuroma fourth interdigital space new developing neuroma third interdigital space left.  Plan: Injected the third interdigital space today with Kenalog and local anesthetic he will follow-up with me in 1 month if necessary otherwise as needed

## 2023-12-13 ENCOUNTER — Ambulatory Visit: Payer: Self-pay | Admitting: Nurse Practitioner

## 2023-12-29 ENCOUNTER — Encounter: Payer: Self-pay | Admitting: Podiatry

## 2023-12-29 ENCOUNTER — Ambulatory Visit: Payer: PPO | Admitting: Podiatry

## 2023-12-29 DIAGNOSIS — G5782 Other specified mononeuropathies of left lower limb: Secondary | ICD-10-CM | POA: Diagnosis not present

## 2023-12-29 NOTE — Progress Notes (Signed)
 He presents today for follow-up of his neuroma to his third and fourth interdigital spaces of his left foot.  States that is doing much better.  States that he continues to improve.  Objective: Vital signs stable oriented x 3 there is no erythema edema salines drainage or odor.  No pain on palpation to the third and fourth interdigital spaces.  Assessment: Resolving neuroma.  Plan: Follow-up with me on an as-needed basis.

## 2024-01-11 DIAGNOSIS — L720 Epidermal cyst: Secondary | ICD-10-CM | POA: Diagnosis not present

## 2024-01-11 DIAGNOSIS — L821 Other seborrheic keratosis: Secondary | ICD-10-CM | POA: Diagnosis not present

## 2024-01-11 DIAGNOSIS — L820 Inflamed seborrheic keratosis: Secondary | ICD-10-CM | POA: Diagnosis not present

## 2024-01-11 DIAGNOSIS — L400 Psoriasis vulgaris: Secondary | ICD-10-CM | POA: Diagnosis not present

## 2024-03-02 ENCOUNTER — Encounter: Payer: Self-pay | Admitting: Family Medicine

## 2024-03-02 ENCOUNTER — Ambulatory Visit (INDEPENDENT_AMBULATORY_CARE_PROVIDER_SITE_OTHER): Payer: Medicare HMO | Admitting: Family Medicine

## 2024-03-02 VITALS — BP 123/70 | HR 70 | Ht 73.23 in | Wt 209.9 lb

## 2024-03-02 DIAGNOSIS — M25551 Pain in right hip: Secondary | ICD-10-CM

## 2024-03-02 DIAGNOSIS — Z7689 Persons encountering health services in other specified circumstances: Secondary | ICD-10-CM

## 2024-03-02 DIAGNOSIS — L409 Psoriasis, unspecified: Secondary | ICD-10-CM

## 2024-03-02 NOTE — Patient Instructions (Signed)
 It was nice to see you today,  We addressed the following topics today: -No labs today.  I am sending in an order for a x-ray of your hip.  You can get this done at any time at [facility] imaging location - They take walk-ins.  They do not take appointments for x-rays - I will see you back in November. - For your hip pain I think it is most likely arthritis.  You can use Tylenol  1000 mg up to 3 times a day, any topical over-the-counter pain relieving treatment is safe for you to use, heating pads are also helpful.  Have a great day,  Etha Henle, MD

## 2024-03-02 NOTE — Progress Notes (Unsigned)
 New Patient Office Visit  Subjective   Patient ID: Christopher Lawrence, male    DOB: [birth year]  Age: 68 y.o. MRN: [id number]  CC:  Chief Complaint  Patient presents with   New Patient (Initial Visit)    HPI Christopher Lawrence presents to establish care Dr. Cassondra Cliff patient  Subjective: - Reports sciatic pain from hip down, primarily at night when sleeping on side. Describes as ache, not burning or tingling. Walking doesn't worsen pain. - Last saw Dr. Cassondra Cliff in November for physical exam. Dr. Cassondra Cliff passed away in 11/27/23. - Had cold around Thanksgiving requiring prednisone  and cough syrup for less than a week. Improved during day but persisted at night until just before Christmas. Another head cold this past week.  - PMHx: hypertension, testosterone  deficiency (improved with zinc supplement), psoriasis  - Medications: clobetasol  for psoriasis spots. Previously used AndroGel  but discontinued d/t concern it worsened psoriatic rash - Had nausea medicine and antibiotic in December  - Surgical Hx: foot surgery for bunion and hammer toe with Dr. Lara Plants  - Social Hx: Retired. 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 Married, no children. Former smoker, quit in 90s. Regular wine consumption. - Regular exercise: walks golf course almost every morning (3-4 miles), uses dumbbells and home equipment - Last colonoscopy December 2020, recommended 5-year follow-up - Shoulder issue February last year with difficulty lifting arm, diagnosed with glenohumeral joint arthritis - Dr. Lara Plants previously mentioned possibility of rheumatoid/psoriatic arthritis after foot surgery    Outpatient Encounter Medications as of 03/02/2024  Medication Sig   BIOTIN PO Take by mouth.   Calcium Carbonate (CALCIUM 600 PO) Take 1 tablet by mouth daily.   Cholecalciferol (VITAMIN D  PO) Take 2,000 Units by mouth 2 (two) times daily. 5,000 units   clobetasol  ointment (TEMOVATE )  0.05 % APPLY TO AFFECTED AREA TWICE A DAY   fexofenadine  (ALLEGRA ) 180 MG tablet Take 1 tablet (180 mg total) by mouth daily.   fluticasone  (FLONASE ) 50 MCG/ACT nasal spray SPRAY 2 SPRAYS INTO EACH NOSTRIL EVERY DAY   Magnesium 500 MG TABS Take 1 tablet by mouth daily.    Multiple Vitamins-Minerals (MULTIVITAMIN PO) Take 1 tablet by mouth daily.    OVER THE COUNTER MEDICATION Saw Palmetto 2 tabs daily   promethazine -dextromethorphan (PROMETHAZINE -DM) 6.25-15 MG/5ML syrup Take 5 mLs by mouth 4 (four) times daily as needed for cough.   zinc gluconate 50 MG tablet Take 50 mg by mouth daily.   [DISCONTINUED] predniSONE  (DELTASONE ) 10 MG tablet 1 tab 3 x day for 2 days, then 1 tab 2 x day for 2 days, then 1 tab 1 x day for 3 days   No facility-administered encounter medications on file as of 03/02/2024.    Past Medical History:  Diagnosis Date   Allergy    GERD (gastroesophageal reflux disease)    Hx of adenomatous polyp of colon 10/27/2016   Labile hypertension    Other testicular hypofunction    Psoriasis     Past Surgical History:  Procedure Laterality Date   COLONOSCOPY  10/2016   COLONOSCOPY N/A 10/2016   HEMORRHOID BANDING  04/2011   HERNIA REPAIR Right 1958   INSERTION OF MESH N/A 08/30/2014   Procedure: INSERTION OF MESH;  Surgeon: Candyce Champagne, MD;  Location: [facility] OR;  Service: General;  Laterality: N/A;   LAPAROSCOPIC INGUINAL HERNIA WITH UMBILICAL HERNIA N/A 08/30/2014   Procedure: LAPAROSCOPIC EXPLORATION AND REPAIR OF BILATERAL HERNIAS IN  GROINS AND  UMBILICAL HERNIA REPAIR.;  Surgeon: Candyce Champagne, MD;  Location: [facility] OR;  Service: General;  Laterality: N/A;   POLYPECTOMY      Family History  Problem Relation Age of Onset   Hypertension Mother    Colon polyps Mother    Cancer Mother        Uterine   Uterine cancer Mother    COPD Father    Arthritis Father    Cancer Father        Prostate   Prostate cancer Father    Colon cancer Neg Hx    Esophageal cancer Neg Hx     Rectal cancer Neg Hx    Stomach cancer Neg Hx     Social History   Socioeconomic History   Marital status: Married    Spouse name: Not on file   Number of children: Not on file   Years of education: Not on file   Highest education level: Not on file  Occupational History   Not on file  Tobacco Use   Smoking status: Former    Current packs/day: 0.00    Types: Cigarettes    Quit date: 09/28/1996    Years since quitting: 27.4   Smokeless tobacco: Never  Substance and Sexual Activity   Alcohol use: Yes    Alcohol/week: 14.0 standard drinks of alcohol    Types: 14 Glasses of wine per week    Comment: daily  wine   Drug use: No   Sexual activity: Not on file  Other Topics Concern   Not on file  Social History Narrative   Not on file   Social Drivers of Health   Financial Resource Strain: Not on file  Food Insecurity: Not on file  Transportation Needs: No Transportation Needs (07/13/2022)   PRAPARE - Administrator, Civil Service (Medical): No    Lack of Transportation (Non-Medical): No  Physical Activity: Not on file  Stress: Not on file  Social Connections: Not on file  Intimate Partner Violence: Not on file    ROS     Objective   BP 123/70   Pulse 70   Ht 6' 1.23" (1.86 m)   Wt 209 lb 14.4 oz (95.2 kg) Comment: Pt weighed wih shoes on  SpO2 93%   BMI 27.52 kg/m   Physical Exam General: Alert, oriented CV: Regular rhythm Pulmonary: Scar bilaterally GI: Soft, nontender MSK: Veldon German positive, FADIR negative     Assessment & Plan:   Encounter to establish care  Psoriasis Assessment & Plan: Sees dermatology.  Uses clobetasol  cream.    Right hip pain Assessment & Plan: Pt described pain as sciatica but has negative straight legged test.  Seems more consistent with osteoarthritis.  Will get hip xray to help further evaluate.  Discussed otc treatment options.    Orders: -     DG HIP UNILAT W OR W/O PELVIS 2-3 VIEWS RIGHT;  Future    Return in about 6 months (around 09/12/2024) for physical.   Laneta Pintos, MD

## 2024-03-10 DIAGNOSIS — M25551 Pain in right hip: Secondary | ICD-10-CM | POA: Insufficient documentation

## 2024-03-10 NOTE — Assessment & Plan Note (Addendum)
 Pt described pain as sciatica but has negative straight legged test.  Seems more consistent with osteoarthritis.  Will get hip xray to help further evaluate.  Discussed otc treatment options.

## 2024-03-10 NOTE — Assessment & Plan Note (Signed)
 Sees dermatology.  Uses clobetasol  cream.

## 2024-05-09 ENCOUNTER — Ambulatory Visit
Admission: RE | Admit: 2024-05-09 | Discharge: 2024-05-09 | Disposition: A | Discharge status: Home / Self Care | Source: Ambulatory Visit | Attending: Family Medicine

## 2024-05-09 DIAGNOSIS — M25551 Pain in right hip: Secondary | ICD-10-CM

## 2024-05-10 ENCOUNTER — Ambulatory Visit (INDEPENDENT_AMBULATORY_CARE_PROVIDER_SITE_OTHER)

## 2024-05-10 DIAGNOSIS — Z0000 Encounter for general adult medical examination without abnormal findings: Secondary | ICD-10-CM | POA: Diagnosis not present

## 2024-05-10 NOTE — Progress Notes (Signed)
 Subjective:   Christopher Lawrence is a 68 y.o. who presents for a Medicare Wellness preventive visit.  As a reminder, Annual Wellness Visits don't include a physical exam, and some assessments may be limited, especially if this visit is performed virtually. We may recommend an in-person follow-up visit with your provider if needed.  Visit Complete: Virtual I connected with  [name] on 05/10/24 by a video and audio enabled telemedicine application and verified that I am speaking with the correct person using two identifiers.  Patient Location: Home  Provider Location: Home Office  I discussed the limitations of evaluation and management by telemedicine. The patient expressed understanding and agreed to proceed.  Vital Signs: Because this visit was a virtual/telehealth visit, some criteria may be missing or patient reported. Any vitals not documented were not able to be obtained and vitals that have been documented are patient reported.    Persons Participating in Visit: Patient.  AWV Questionnaire: No: Patient Medicare AWV questionnaire was not completed prior to this visit.  Cardiac Risk Factors include: advanced age (>70men, >31 women);hypertension;male gender     Objective:    Today's Vitals   There is no height or weight on file to calculate BMI.     05/10/2024    1:05 PM 07/21/2023   10:02 PM 12/29/2022    2:53 PM 10/21/2021    2:49 PM 08/21/2014   11:29 AM  Advanced Directives  Does Patient Have a Medical Advance Directive? Yes No Yes Yes No;Yes   Type of Estate agent of [place];Living will  Healthcare Power of [place];Living will Healthcare Power of [place];Living will Healthcare Power of [place];Living will   Does patient want to make changes to medical advance directive?   No - Patient declined No - Patient declined No - Patient declined   Copy of Healthcare Power of Attorney in Chart? No - copy requested  No - copy requested No - copy  requested No - copy requested   Would patient like information on creating a medical advance directive?  No - Patient declined        Data saved with a previous flowsheet row definition    Current Medications (verified) Outpatient Encounter Medications as of 05/10/2024  Medication Sig   Calcium Carbonate (CALCIUM 600 PO) Take 1 tablet by mouth daily.   Cholecalciferol (VITAMIN D  PO) Take 2,000 Units by mouth 2 (two) times daily. 5,000 units   clobetasol  ointment (TEMOVATE ) 0.05 % APPLY TO AFFECTED AREA TWICE A DAY   fexofenadine  (ALLEGRA ) 180 MG tablet Take 1 tablet (180 mg total) by mouth daily.   fluticasone  (FLONASE ) 50 MCG/ACT nasal spray SPRAY 2 SPRAYS INTO EACH NOSTRIL EVERY DAY   Magnesium 500 MG TABS Take 1 tablet by mouth daily.    Multiple Vitamins-Minerals (MULTIVITAMIN PO) Take 1 tablet by mouth daily.    OVER THE COUNTER MEDICATION Saw Palmetto 2 tabs daily   promethazine -dextromethorphan (PROMETHAZINE -DM) 6.25-15 MG/5ML syrup Take 5 mLs by mouth 4 (four) times daily as needed for cough.   zinc gluconate 50 MG tablet Take 50 mg by mouth daily.   BIOTIN PO Take by mouth. (Patient not taking: Reported on 05/10/2024)   No facility-administered encounter medications on file as of 05/10/2024.    Allergies (verified) Wasp venom and Androgel  [testosterone ]   History: Past Medical History:  Diagnosis Date   Allergy    GERD (gastroesophageal reflux disease)    Hx of adenomatous polyp of colon 10/27/2016   Labile  hypertension    Other testicular hypofunction    Psoriasis    Past Surgical History:  Procedure Laterality Date   COLONOSCOPY  10/2016   COLONOSCOPY N/A 10/2016   HEMORRHOID BANDING  04/2011   HERNIA REPAIR Right 1958   INSERTION OF MESH N/A 08/30/2014   Procedure: INSERTION OF MESH;  Surgeon: Elspeth Schultze, MD;  Location: [facility] OR;  Service: General;  Laterality: N/A;   LAPAROSCOPIC INGUINAL HERNIA WITH UMBILICAL HERNIA N/A 08/30/2014   Procedure: LAPAROSCOPIC  EXPLORATION AND REPAIR OF BILATERAL HERNIAS IN  GROINS AND UMBILICAL HERNIA REPAIR.;  Surgeon: Elspeth Schultze, MD;  Location: [facility] OR;  Service: General;  Laterality: N/A;   POLYPECTOMY     Family History  Problem Relation Age of Onset   Hypertension Mother    Colon polyps Mother    Cancer Mother        Uterine   Uterine cancer Mother    COPD Father    Arthritis Father    Cancer Father        Prostate   Prostate cancer Father    Colon cancer Neg Hx    Esophageal cancer Neg Hx    Rectal cancer Neg Hx    Stomach cancer Neg Hx    Social History   Socioeconomic History   Marital status: Married    Spouse name: Not on file   Number of children: Not on file   Years of education: Not on file   Highest education level: Not on file  Occupational History   Not on file  Tobacco Use   Smoking status: Former    Current packs/day: 0.00    Types: Cigarettes    Quit date: 09/28/1996    Years since quitting: 27.6   Smokeless tobacco: Never  Vaping Use   Vaping status: Never Used  Substance and Sexual Activity   Alcohol use: Yes    Alcohol/week: 14.0 standard drinks of alcohol    Types: 14 Glasses of wine per week    Comment: daily  wine   Drug use: No   Sexual activity: Not on file  Other Topics Concern   Not on file  Social History Narrative   Not on file   Social Drivers of Health   Financial Resource Strain: Low Risk  (05/10/2024)   Overall Financial Resource Strain (CARDIA)    Difficulty of Paying Living Expenses: Not hard at all  Food Insecurity: No Food Insecurity (05/10/2024)   Hunger Vital Sign    Worried About Running Out of Food in the Last Year: Never true    Ran Out of Food in the Last Year: Never true  Transportation Needs: No Transportation Needs (05/10/2024)   PRAPARE - Administrator, Civil Service (Medical): No    Lack of Transportation (Non-Medical): No  Physical Activity: Sufficiently Active (05/10/2024)   Exercise Vital Sign    Days of Exercise  per Week: 6 days    Minutes of Exercise per Session: 90 min  Stress: No Stress Concern Present (05/10/2024)   [employer] of Occupational Health - Occupational Stress Questionnaire    Feeling of Stress: Not at all  Social Connections: Socially Integrated (05/10/2024)   Social Connection and Isolation Panel    Frequency of Communication with Friends and Family: Three times a week    Frequency of Social Gatherings with Friends and Family: Once a week    Attends Religious Services: More than 4 times per year    Active Member of [employer]  or Organizations: Yes    Attends Engineer, structural: More than 4 times per year    Marital Status: Married    Tobacco Counseling Counseling given: Not Answered    Clinical Intake:  Pre-visit preparation completed: Yes  Pain : No/denies pain     Nutritional Risks: None Diabetes: No  Lab Results  Component Value Date   HGBA1C 5.3 09/08/2023   HGBA1C 5.4 09/02/2022   HGBA1C 5.1 09/02/2021     How often do you need to have someone help you when you read instructions, pamphlets, or other written materials from your doctor or pharmacy?: 1 - Never  Interpreter Needed?: No  Information entered by :: NAllen LPN   Activities of Daily Living     05/10/2024    1:01 PM 09/11/2023   12:55 AM  In your present state of health, do you have any difficulty performing the following activities:  Hearing? 0 0  Vision? 1 0  Difficulty concentrating or making decisions? 0 0  Walking or climbing stairs? 0 0  Dressing or bathing? 0 0  Doing errands, shopping? 0 0  Preparing Food and eating ? N   Using the Toilet? N   In the past six months, have you accidently leaked urine? N   Do you have problems with loss of bowel control? N   Managing your Medications? N   Managing your Finances? N   Housekeeping or managing your Housekeeping? N     Patient Care Team: Chandra Toribio POUR, MD as PCP - General (Family Medicine) Nieves Cough, MD as  Consulting Physician (Urology) Luis Purchase, MD as Consulting Physician (Gastroenterology)  I have updated your Care Teams any recent Medical Services you may have received from other providers in the past year.     Assessment:   This is a routine wellness examination for [place].  Hearing/Vision screen Hearing Screening - Comments:: Denies hearing issues Vision Screening - Comments:: [facility], regular eye exams   Goals Addressed             This Visit's Progress    Patient Stated       05/10/2024, wants to lose weight       Depression Screen     05/10/2024    1:06 PM 03/02/2024   11:02 AM 09/11/2023   12:53 AM 12/29/2022    2:53 PM 09/01/2022   11:49 PM 07/13/2022   11:27 AM 10/21/2021    2:53 PM  PHQ 2/9 Scores  PHQ - 2 Score 0 0 0 0 0 0 0  PHQ- 9 Score 1 0         Fall Risk     05/10/2024    1:06 PM 09/11/2023   12:53 AM 12/29/2022    2:53 PM 09/01/2022   11:49 PM 10/21/2021    2:50 PM  Fall Risk   Falls in the past year? 0 0 0 0 0  Number falls in past yr: 0  0  0  Injury with Fall? 0  0  0  Risk for fall due to : No Fall Risks No Fall Risks No Fall Risks No Fall Risks No Fall Risks  Follow up Falls evaluation completed;Falls prevention discussed Falls prevention discussed;Education provided;Falls evaluation completed Falls evaluation completed;Falls prevention discussed Falls prevention discussed;Education provided;Falls evaluation completed  Falls evaluation completed;Follow up appointment      Data saved with a previous flowsheet row definition    MEDICARE RISK AT HOME:  Medicare Risk at Home  Any stairs in or around the home?: Yes If so, are there any without handrails?: No Home free of loose throw rugs in walkways, pet beds, electrical cords, etc?: Yes Adequate lighting in your home to reduce risk of falls?: Yes Life alert?: No Use of a cane, walker or w/c?: No Grab bars in the bathroom?: No Shower chair or bench in shower?: No Elevated toilet  seat or a handicapped toilet?: Yes  TIMED UP AND GO:  Was the test performed?  No  Cognitive Function: 6CIT completed        05/10/2024    1:08 PM  6CIT Screen  What Year? 0 points  What month? 0 points  What time? 0 points  Count back from 20 0 points  Months in reverse 0 points  Repeat phrase 0 points  Total Score 0 points    Immunizations Immunization History  Administered Date(s) Administered   Fluad Quad(high Dose 65+) 08/22/2023   Influenza Inj Mdck Quad With Preservative 09/30/2017, 08/23/2018, 09/03/2019, 08/27/2020   Influenza Split 07/19/2013, 07/29/2014, 07/31/2015   Influenza, High Dose Seasonal PF 09/02/2021, 09/02/2022   Influenza,inj,quad, With Preservative 08/26/2016   Moderna Covid-19 Fall Seasonal Vaccine 40yrs & older 09/06/2022   PFIZER(Purple Top)SARS-COV-2 Vaccination 01/22/2020, 02/18/2020, 09/12/2020, 05/19/2021   PNEUMOCOCCAL CONJUGATE-20 10/21/2021   PPD Test 07/29/2014, 07/31/2015, 08/26/2016, 06/20/2017, 07/11/2018, 07/30/2019, 08/27/2020   Pfizer(Comirnaty)Fall Seasonal Vaccine 12 years and older 01/17/2024   Pneumococcal Polysaccharide-23 11/01/2008   RSV,unspecified 08/22/2023   Respiratory Syncytial Virus Vaccine,Recomb Aduvanted(Arexvy) 09/06/2022   Td 11/01/2006, 06/20/2017   Unspecified SARS-COV-2 Vaccination 08/22/2023   Zoster, Live 08/26/2016    Screening Tests Health Maintenance  Topic Date Due   COVID-19 Vaccine (8 - 2024-25 season) 03/13/2024   INFLUENZA VACCINE  06/01/2024   Colonoscopy  10/28/2024   Medicare Annual Wellness (AWV)  05/10/2025   DTaP/Tdap/Td (3 - Tdap) 06/21/2027   Pneumococcal Vaccine: 50+ Years  Completed   Hepatitis C Screening  Completed   Hepatitis B Vaccines  Aged Out   HPV VACCINES  Aged Out   Meningococcal B Vaccine  Aged Out   Zoster Vaccines- Shingrix  Discontinued    Health Maintenance  Health Maintenance Due  Topic Date Due   COVID-19 Vaccine (8 - 2024-25 season) 03/13/2024   Health  Maintenance Items Addressed: Up to date  Additional Screening:  Vision Screening: Recommended annual ophthalmology exams for early detection of glaucoma and other disorders of the eye. Would you like a referral to an eye doctor? No    Dental Screening: Recommended annual dental exams for proper oral hygiene  Community Resource Referral / Chronic Care Management: CRR required this visit?  No   CCM required this visit?  No   Plan:    I have personally reviewed and noted the following in the patient's chart:   Medical and social history Use of alcohol, tobacco or illicit drugs  Current medications and supplements including opioid prescriptions. Patient is not currently taking opioid prescriptions. Functional ability and status Nutritional status Physical activity Advanced directives List of other physicians Hospitalizations, surgeries, and ER visits in previous 12 months Vitals Screenings to include cognitive, depression, and falls Referrals and appointments  In addition, I have reviewed and discussed with patient certain preventive protocols, quality metrics, and best practice recommendations. A written personalized care plan for preventive services as well as general preventive health recommendations were provided to patient.   Ardella FORBES Dawn, LPN   2/89/7974   After Visit Summary: (MyChart) Due to this  being a telephonic visit, the after visit summary with patients personalized plan was offered to patient via MyChart   Notes: Nothing significant to report at this time.

## 2024-05-10 NOTE — Patient Instructions (Addendum)
 Christopher Lawrence , Thank you for taking time out of your busy schedule to complete your Annual Wellness Visit with me. I enjoyed our conversation and look forward to speaking with you again next year. I, as well as your care team,  appreciate your ongoing commitment to your health goals. Please review the following plan we discussed and let me know if I can assist you in the future. Your Game plan/ To Do List    Referrals: If you haven't heard from the office you've been referred to, please reach out to them at the phone provided.  N/a Follow up Visits: Next Medicare AWV with our clinical staff: 07/04/2025 at 1:00   Have you seen your provider in the last 6 months (3 months if uncontrolled diabetes)? Yes Next Office Visit with your provider: 09/10/2024 at 1:30  Clinician Recommendations:  Aim for 30 minutes of exercise or brisk walking, 6-8 glasses of water, and 5 servings of fruits and vegetables each day.       This is a list of the screening recommended for you and due dates:  Health Maintenance  Topic Date Due   COVID-19 Vaccine (8 - 2024-25 season) 03/13/2024   Flu Shot  06/01/2024   Colon Cancer Screening  10/28/2024   Medicare Annual Wellness Visit  05/10/2025   DTaP/Tdap/Td vaccine (3 - Tdap) 06/21/2027   Pneumococcal Vaccine for age over 32  Completed   Hepatitis C Screening  Completed   Hepatitis B Vaccine  Aged Out   HPV Vaccine  Aged Out   Meningitis B Vaccine  Aged Out   Zoster (Shingles) Vaccine  Discontinued    Advanced directives: (Copy Requested) Please bring a copy of your health care power of attorney and living will to the office to be added to your chart at your convenience. You can mail to [facility] [address] [place], [place] [address] or email to [email or web address] Advance Care Planning is important because it:  [x]  Makes sure you receive the medical care that is consistent with your values, goals, and preferences  [x]  It provides  guidance to your family and loved ones and reduces their decisional burden about whether or not they are making the right decisions based on your wishes.  Follow the link provided in your after visit summary or read over the paperwork we have mailed to you to help you started getting your Advance Directives in place. If you need assistance in completing these, please reach out to us  so that we can help you!  See attachments for Preventive Care and Fall Prevention Tips.

## 2024-05-14 ENCOUNTER — Ambulatory Visit: Payer: Self-pay | Admitting: Family Medicine

## 2024-05-15 ENCOUNTER — Other Ambulatory Visit: Payer: Self-pay | Admitting: Family Medicine

## 2024-05-15 DIAGNOSIS — M25551 Pain in right hip: Secondary | ICD-10-CM

## 2024-06-06 ENCOUNTER — Ambulatory Visit: Admitting: Orthopaedic Surgery

## 2024-06-06 DIAGNOSIS — M1612 Unilateral primary osteoarthritis, left hip: Secondary | ICD-10-CM

## 2024-06-06 DIAGNOSIS — M1611 Unilateral primary osteoarthritis, right hip: Secondary | ICD-10-CM

## 2024-06-06 DIAGNOSIS — M25551 Pain in right hip: Secondary | ICD-10-CM | POA: Diagnosis not present

## 2024-06-06 NOTE — Progress Notes (Signed)
 The patient is a 68 year old gentleman who just turned 80 today who was sent to me from Dr. Toribio Slain his primary care physician to evaluate and treat right hip pain.  The patient is an avid walker and walks on the golf course every morning.  He does report stiffness and lateral hip pain on the right side has been going on for about 6 to 8 months.  He said he is very stiff first thing in the morning.  He is not obese.  He is not a diabetic.  He has never injured his hips or had any type of injections or surgery.  He does have x-rays on the canopy system of his pelvis and right hip for us  to review.  I was able to review his past medical history and medications within epic.  On exam both hips are slightly stiff with internal and external rotation but not a lot of pain in the groin at all.  He is stiff when he for stands up when I noticed that when he walks but he is able to walk this off.  He does have a little bit of pain over the lateral aspect of the right hip.  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  This is something this been going on for a long period of time.  I did go over his x-ray findings with him and talked about the hip replacement surgery in the future.  However, it sounds like his stiffness has been more of an issue in he is not close to needing hip replacement surgery yet from our standpoint either.  I do feel that he would benefit from 1 or 2 outpatient physical therapy sessions where they can design a home exercise program and a stretching program for him for his hips and his back and core which can help him stay mobile.  He is definitely open to seeing physical therapy and having them help him with a home exercise and stretching program.  We can then see him back in about 6  weeks to see how he is doing overall.  All question concerns were answered and addressed.  Hopefully we can get physical therapy scheduled for him soon for his hips.

## 2024-06-07 ENCOUNTER — Other Ambulatory Visit: Payer: Self-pay

## 2024-06-07 DIAGNOSIS — M1612 Unilateral primary osteoarthritis, left hip: Secondary | ICD-10-CM

## 2024-06-07 DIAGNOSIS — M1611 Unilateral primary osteoarthritis, right hip: Secondary | ICD-10-CM

## 2024-06-25 ENCOUNTER — Encounter: Payer: Self-pay | Admitting: Physical Therapy

## 2024-06-25 ENCOUNTER — Ambulatory Visit: Admitting: Physical Therapy

## 2024-06-25 DIAGNOSIS — M25552 Pain in left hip: Secondary | ICD-10-CM | POA: Diagnosis not present

## 2024-06-25 DIAGNOSIS — M6281 Muscle weakness (generalized): Secondary | ICD-10-CM | POA: Diagnosis not present

## 2024-06-25 DIAGNOSIS — M25551 Pain in right hip: Secondary | ICD-10-CM

## 2024-06-25 NOTE — Therapy (Signed)
 OUTPATIENT PHYSICAL THERAPY LOWER EXTREMITY EVALUATION   Patient Name: Christopher Lawrence MRN: [id number] DOB:1955-12-16, 68 y.o., male Today's Date: 06/25/2024  END OF SESSION:  PT End of Session - 06/25/24 1411     Visit Number 1    Number of Visits 10    Date for PT Re-Evaluation 09/03/24    Progress Note Due on Visit 10    PT Start Time 1100    PT Stop Time 1140    PT Time Calculation (min) 40 min    Activity Tolerance Patient tolerated treatment well    Behavior During Therapy WFL for tasks assessed/performed          Past Medical History:  Diagnosis Date   Allergy    GERD (gastroesophageal reflux disease)    Hx of adenomatous polyp of colon 10/27/2016   Labile hypertension    Other testicular hypofunction    Psoriasis    Past Surgical History:  Procedure Laterality Date   COLONOSCOPY  10/2016   COLONOSCOPY N/A 10/2016   HEMORRHOID BANDING  04/2011   HERNIA REPAIR Right 1958   INSERTION OF MESH N/A 08/30/2014   Procedure: INSERTION OF MESH;  Surgeon: Elspeth Schultze, MD;  Location: [facility] OR;  Service: General;  Laterality: N/A;   LAPAROSCOPIC INGUINAL HERNIA WITH UMBILICAL HERNIA N/A 08/30/2014   Procedure: LAPAROSCOPIC EXPLORATION AND REPAIR OF BILATERAL HERNIAS IN  GROINS AND UMBILICAL HERNIA REPAIR.;  Surgeon: Elspeth Schultze, MD;  Location: [facility] OR;  Service: General;  Laterality: N/A;   POLYPECTOMY     Patient Active Problem List   Diagnosis Date Noted   Right hip pain 03/10/2024   Hx of adenomatous polyp of colon 10/27/2016   Vitamin D  deficiency 07/26/2014   Gastroesophageal reflux disease    Labile hypertension    Testosterone  deficiency    Psoriasis     PCP: Chandra Toribio POUR, MD   REFERRING PROVIDER: Vernetta Lonni GRADE*    REFERRING DIAG:  F83.88 (ICD-10-CM) - Unilateral primary osteoarthritis, right hip  M16.12 (ICD-10-CM) - Unilateral primary osteoarthritis, left hip    THERAPY DIAG:  Pain in right hip  Pain in left hip  Muscle weakness  (generalized)  Rationale for Evaluation and Treatment: Rehabilitation  ONSET DATE: 6-8 months  SUBJECTIVE:   SUBJECTIVE STATEMENT: Pt stating his pain currenlty is 1-2/10 in his Rt hip, Pt reporting left hip pain is 1/10. Pt stating pain has been on-going for 7-8 months. Pt stating his cat tries to sleep between his legs and he sleeps with his legs ER.   PERTINENT HISTORY: He does report stiffness and lateral hip pain on the right side has been going on for about 7 to 8 months. He said he is very stiff first thing in the morning. Pt enjoys walking the golf course. Pt reporting pain on lateral right hip.  Foot surgery on left   PAIN:  NPRS scale: 2/10 in Rt hip, 1/10 in left hip Pain description: achy Aggravating factors: sleeping Relieving factors: changing positions  PRECAUTIONS: None  WEIGHT BEARING RESTRICTIONS: No  FALLS:  Has patient fallen in last 6 months? No  LIVING ENVIRONMENT: Lives with: lives with their family and lives with their spouse Lives in: House/apartment Stairs: Yes: External: 1 steps; none split level home with rail on Rt Has following equipment at home: None  OCCUPATION: not currently working  PLOF: Independent  PATIENT GOALS: walk golf course without pain, wake up and be able to walk with upright posture and less stiffness  Next  MD visit: 07/18/24 c Dr. 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   PATIENT SURVEYS:  Patient-Specific Activity Scoring Scheme  0 represents "unable to perform." 10 represents "able to perform at prior level. 0 1 2 3 4 5 6 7 8 9  10 (Date and Score)   Activity Eval      1. Yard work: standing up straight after stooping 7     2. Getting on/off  floor/ground  7    3.     4.    5.    Score 7    Total score = sum of the activity scores/number of activities Minimum detectable change (90%CI) for average score = 2 points Minimum detectable change (90%CI) for single activity score = 3 points  COGNITION: Overall cognitive status: WFL    SENSATION: WFL   MUSCLE LENGTH: Positive Thomas test: bil LE's   POSTURE:  rounded shoulders and forward head     LOWER EXTREMITY ROM:   ROM Right eval Left eval  Hip flexion 102 115  Hip extension 12 18  Hip abduction    Hip adduction    Hip internal rotation    Hip external rotation    Knee flexion [facility]  Knee extension    Ankle dorsiflexion    Ankle plantarflexion    Ankle inversion    Ankle eversion     (Blank rows = not tested)  LOWER EXTREMITY MMT:  MMT Right Eval Sitting 06/25/24  Left Eval Sitting 06/25/24  Hip flexion 34.5 39.4  Hip extension    Hip abduction    Hip adduction    Hip internal rotation    Hip external rotation    Knee flexion    Knee extension    Ankle dorsiflexion    Ankle plantarflexion    Ankle inversion    Ankle eversion     (Blank rows = not tested)   GAIT: Distance walked: clinic distances Assistive device utilized: None Level of assistance: Complete Independence Comments:                                                                                                                                                                         TODAY'S TREATMENT  DATE: Therex: HEP instruction/performance c cues for techniques, handout provided.  Trial set performed of each for comprehension and symptom assessment.  See below for exercise list Self Care:  Pt was instructed to work on posture correction when standing Pt also instructed in sleeping with pillow between his knees when sleeping on his side to help support lumbar spine   PATIENT EDUCATION:   Education details: HEP, POC Person educated: Patient Education method: Programmer, multimedia, Demonstration, Verbal cues, and Handouts Education comprehension: verbalized understanding, returned demonstration, and verbal cues required   HOME EXERCISE PROGRAM: Access Code: [email or web address] URL: [email or web address] Date: 06/25/2024 Prepared by: Delon Lunger  Exercises - Supine Figure 4 Piriformis Stretch  - 2 x daily - 7 x weekly - 3 reps - 15-20 seconds hold - Standing Hip Flexor Stretch  - 2 x daily - 7 x weekly - 3 reps - 15-20 seconds hold - Standing Lumbar Extension at Wall - Forearms  - 2 x daily - 7 x weekly - 10 reps - 10 seconds hold - Standing Hip Hiking  - 2 x daily - 7 x weekly - 2 sets - 10 reps  ASSESSMENT:  CLINICAL IMPRESSION: Patient is a 68 y.o. who comes to clinic with complaints of bil hip pain with mobility, strength and movement coordination deficits that impair their ability to perform usual daily and recreational functional activities without increase difficulty/symptoms at this time.  Patient to benefit from skilled PT services to address impairments and limitations to improve to previous level of function without restriction secondary to condition.   OBJECTIVE IMPAIRMENTS: decreased balance, difficulty walking, decreased ROM, decreased strength, impaired flexibility, and pain.   ACTIVITY LIMITATIONS: bending, squatting, and transfers  PARTICIPATION LIMITATIONS: community activity and yard work  PERSONAL FACTORS: see PMH above are also affecting patient's functional outcome.   REHAB POTENTIAL: Good  CLINICAL DECISION MAKING: Stable/uncomplicated  EVALUATION COMPLEXITY: Low   GOALS: Goals reviewed with patient? Yes  SHORT TERM GOALS: (target date for Short term goals are 3 weeks 07/16/2024)   1.  Patient will demonstrate independent use of home exercise program to maintain progress from in clinic treatments.  Goal status: New  LONG TERM GOALS:  (target dates for all long term goals are 10 weeks  09/03/2024 )   1. Patient will demonstrate/report pain at worst less than or equal to 2/10 to facilitate minimal limitation in daily activity secondary to pain symptoms.  Goal status: New   2. Patient will demonstrate independent use of home exercise program to facilitate ability to maintain/progress functional gains from skilled physical therapy services.  Goal status: New   3. Patient will demonstrate Patient specific functional scale avg > or = 9 to indicate reduced disability due to condition.   Goal status: New   4.  Patient will demonstrate Rt LE MMT >/= 5 pounds of force using HHD throughout to faciltiate usual transfers, stairs, squatting at PLOF for daily life.   Goal status: New   5.  Patient will demonstrate amb >/= 1/2 mile on TM with upright posture with Rt hip pain of </= 2/10.  Goal status: New   6.  Pt will be able to lift 15# from floor to table height using correct body mechanics with bil hip pain of </= 2/10.  Goal status: New      PLAN:  PT FREQUENCY: 1x/week,   PT DURATION: 10 weeks  PLANNED INTERVENTIONS: Can include 02853- PT Re-evaluation, 97110-Therapeutic exercises, 97530- Therapeutic activity, [id number]- Neuromuscular re-education, 97535- Self Care, 97140-  Manual therapy, [id number]- Gait training, 02239- Orthotic Fit/training, 04007- Canalith repositioning, 02886- Aquatic Therapy, [phone]- Electrical stimulation (unattended), [id number] Physical performance testing, 02983- Vasopneumatic device, [id number]- Ultrasound, [id number]- Traction (mechanical), [id number]- Ionotophoresis 4mg /ml Dexamethasone ,  79439 - Needle insertion w/o injection 1 or 2 muscles, 20561 - Needle insertion w/o injection 3 or more muscles.   Patient/Family education, Balance training, Stair training, Taping, Dry Needling, Joint mobilization, Joint manipulation, Spinal manipulation, Spinal mobilization, Scar mobilization, Vestibular training, Visual/preceptual  remediation/compensation, DME instructions, Cryotherapy, and Moist heat.  All performed as medically necessary.  All included unless contraindicated  PLAN FOR NEXT SESSION: Review HEP knowledge/results. Lumbar mobility, hip mobility and strengthening     Delon JONELLE Lunger, PT, MPT 06/25/2024, 2:12 PM

## 2024-07-05 NOTE — Therapy (Signed)
 OUTPATIENT PHYSICAL THERAPY LOWER EXTREMITY TREATMENT  Patient Name: Christopher Lawrence MRN: [id number] DOB:1956-05-09, 68 y.o., male Today's Date: 07/06/2024  END OF SESSION:  PT End of Session - 07/06/24 1149     Visit Number 2    Number of Visits 10    Date for PT Re-Evaluation 09/03/24    Progress Note Due on Visit 10    PT Start Time 1100    PT Stop Time 1138    PT Time Calculation (min) 38 min    Activity Tolerance Patient tolerated treatment well    Behavior During Therapy WFL for tasks assessed/performed           Past Medical History:  Diagnosis Date   Allergy    GERD (gastroesophageal reflux disease)    Hx of adenomatous polyp of colon 10/27/2016   Labile hypertension    Other testicular hypofunction    Psoriasis    Past Surgical History:  Procedure Laterality Date   COLONOSCOPY  10/2016   COLONOSCOPY N/A 10/2016   HEMORRHOID BANDING  04/2011   HERNIA REPAIR Right 1958   INSERTION OF MESH N/A 08/30/2014   Procedure: INSERTION OF MESH;  Surgeon: Elspeth Schultze, MD;  Location: [facility] OR;  Service: General;  Laterality: N/A;   LAPAROSCOPIC INGUINAL HERNIA WITH UMBILICAL HERNIA N/A 08/30/2014   Procedure: LAPAROSCOPIC EXPLORATION AND REPAIR OF BILATERAL HERNIAS IN  GROINS AND UMBILICAL HERNIA REPAIR.;  Surgeon: Elspeth Schultze, MD;  Location: [facility] OR;  Service: General;  Laterality: N/A;   POLYPECTOMY     Patient Active Problem List   Diagnosis Date Noted   Right hip pain 03/10/2024   Hx of adenomatous polyp of colon 10/27/2016   Vitamin D  deficiency 07/26/2014   Gastroesophageal reflux disease    Labile hypertension    Testosterone  deficiency    Psoriasis     PCP: Chandra Toribio POUR, MD   REFERRING PROVIDER: Vernetta Lonni GRADE*    REFERRING DIAG:  F83.88 (ICD-10-CM) - Unilateral primary osteoarthritis, right hip  M16.12 (ICD-10-CM) - Unilateral primary osteoarthritis, left hip    THERAPY DIAG:  Pain in right hip  Pain in left hip  Muscle weakness  (generalized)  Rationale for Evaluation and Treatment: Rehabilitation  ONSET DATE: 6-8 months  SUBJECTIVE:   SUBJECTIVE STATEMENT: Pt stating Sunday slipped on step and fell on back .  No bruising.  Did not see doctor.  Some back stiffness.   Yesterday had R lateral thigh pain that has dissipated.  Has been able to try the HEP but had pain with supine piriformis stretch.  PERTINENT HISTORY: He does report stiffness and lateral hip pain on the right side has been going on for about 7 to 8 months. He said he is very stiff first thing in the morning. Pt enjoys walking the golf course. Pt reporting pain on lateral right hip.  Foot surgery on left   PAIN:  NPRS scale: 2/10 in Rt hip, 3/10 in left hip 4/10 back Pain description: achy Aggravating factors: sleeping Relieving factors: changing positions  PRECAUTIONS: None  WEIGHT BEARING RESTRICTIONS: No  FALLS:  Has patient fallen in last 6 months? No  LIVING ENVIRONMENT: Lives with: lives with their family and lives with their spouse Lives in: House/apartment Stairs: Yes: External: 1 steps; none split level home with rail on Rt Has following equipment at home: None  OCCUPATION: not currently working  PLOF: Independent  PATIENT GOALS: walk golf course without pain, wake up and be able to walk with upright  posture and less stiffness  Next MD visit: 07/18/24 c Dr. 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   PATIENT SURVEYS:  Patient-Specific Activity Scoring Scheme  0 represents "unable to perform." 10 represents "able to perform at prior level. 0 1 2 3 4 5 6 7 8 9  10 (Date and Score)   Activity Eval      1. Yard work: standing up straight after stooping 7      2. Getting on/off floor/ground  7    3.     4.    5.    Score 7    Total score = sum of the activity scores/number of activities Minimum detectable change (90%CI) for average score = 2 points Minimum detectable change (90%CI) for single activity score = 3 points  COGNITION: Overall cognitive status: WFL    SENSATION: WFL   MUSCLE LENGTH: Positive Thomas test: bil LE's   POSTURE:  rounded shoulders and forward head     LOWER EXTREMITY ROM:   ROM Right eval Left eval  Hip flexion 102 115  Hip extension 12 18  Hip abduction    Hip adduction    Hip internal rotation    Hip external rotation    Knee flexion [facility]  Knee extension    Ankle dorsiflexion    Ankle plantarflexion    Ankle inversion    Ankle eversion     (Blank rows = not tested)  LOWER EXTREMITY MMT:  MMT Right Eval Sitting 06/25/24  Left Eval Sitting 06/25/24  Hip flexion 34.5 39.4  Hip extension    Hip abduction    Hip adduction    Hip internal rotation    Hip external rotation    Knee flexion    Knee extension    Ankle dorsiflexion    Ankle plantarflexion    Ankle inversion    Ankle eversion     (Blank rows = not tested)   GAIT: Distance walked: clinic distances Assistive device utilized: None Level of assistance: Complete Independence Comments:                                                                                                                                                                         TODAY'S TREATMENT  DATE: 07/06/24 Review of HEP including : Hip hikes, standing hip flexor stretch, standing lumbar extensions, seated piriformis stretch. Added ( with VC for posture) lumbar trunk rotations 2x10 SNTC stretch 3x25 sec Tried piriformis stretch minimal pain with 2 times each side   DATE: Therex: HEP instruction/performance c cues for techniques, handout provided.  Trial set  performed of each for comprehension and symptom assessment.  See below for exercise list Self Care:  Pt was instructed to work on posture correction when standing Pt also instructed in sleeping with pillow between his knees when sleeping on his side to help support lumbar spine   PATIENT EDUCATION:  Education details: HEP, POC Person educated: Patient Education method: Programmer, multimedia, Demonstration, Verbal cues, and Handouts Education comprehension: verbalized understanding, returned demonstration, and verbal cues required   HOME EXERCISE PROGRAM: Access Code: [email or web address] URL: [email or web address] Date: 06/25/2024 Prepared by: Delon Lunger  Exercises - Supine Figure 4 Piriformis Stretch  - 2 x daily - 7 x weekly - 3 reps - 15-20 seconds hold - Standing Hip Flexor Stretch  - 2 x daily - 7 x weekly - 3 reps - 15-20 seconds hold - Standing Lumbar Extension at Wall - Forearms  - 2 x daily - 7 x weekly - 10 reps - 10 seconds hold - Standing Hip Hiking  - 2 x daily - 7 x weekly - 2 sets - 10 reps  ASSESSMENT:  CLINICAL IMPRESSION: Pt needed VC and tactile cuing for hip flexor stretch and piriformis stretch adjustments.  Pt demonstrated understanding.  OBJECTIVE IMPAIRMENTS: decreased balance, difficulty walking, decreased ROM, decreased strength, impaired flexibility, and pain.   ACTIVITY LIMITATIONS: bending, squatting, and transfers  PARTICIPATION LIMITATIONS: community activity and yard work  PERSONAL FACTORS: see PMH above are also affecting patient's functional outcome.   REHAB POTENTIAL: Good  CLINICAL DECISION MAKING: Stable/uncomplicated  EVALUATION COMPLEXITY: Low   GOALS: Goals reviewed with patient? Yes  SHORT TERM GOALS: (target date for Short term goals are 3 weeks 07/16/2024)   1.  Patient will demonstrate independent use of home exercise program to maintain progress from in clinic treatments.  Goal status: New  LONG TERM GOALS: (target dates  for all long term goals are 10 weeks  09/03/2024 )   1. Patient will demonstrate/report pain at worst less than or equal to 2/10 to facilitate minimal limitation in daily activity secondary to pain symptoms.  Goal status: New   2. Patient will demonstrate independent use of home exercise program to facilitate ability to maintain/progress functional gains from skilled physical therapy services.  Goal status: New   3. Patient will demonstrate Patient specific functional scale avg > or = 9 to indicate reduced disability due to condition.   Goal status: New   4.  Patient will demonstrate Rt LE MMT >/= 5 pounds of force using HHD throughout to faciltiate usual transfers, stairs, squatting at PLOF for daily life.   Goal status: New   5.  Patient will demonstrate amb >/= 1/2 mile on TM with upright posture with Rt hip pain of </= 2/10.  Goal status: New   6.  Pt will be able to lift 15# from floor to table height using correct body mechanics with bil hip pain of </= 2/10.  Goal status: New      PLAN:  PT FREQUENCY: 1x/week,   PT DURATION: 10 weeks  PLANNED INTERVENTIONS: Can include 02853- PT Re-evaluation, 97110-Therapeutic exercises, 97530- Therapeutic activity, [id number]- Neuromuscular re-education, 97535- Self Care, 97140- Manual therapy, [id number]-  Gait training, 02239- Orthotic Fit/training, 04007- Canalith repositioning, 02886- Aquatic Therapy, [phone]- Electrical stimulation (unattended), [id number] Physical performance testing, 02983- Vasopneumatic device, [id number]- Ultrasound, [id number]- Traction (mechanical), [id number]- Ionotophoresis 4mg /ml Dexamethasone ,  79439 - Needle insertion w/o injection 1 or 2 muscles, 20561 - Needle insertion w/o injection 3 or more muscles.   Patient/Family education, Balance training, Stair training, Taping, Dry Needling, Joint mobilization, Joint manipulation, Spinal manipulation, Spinal mobilization, Scar mobilization, Vestibular training, Visual/preceptual  remediation/compensation, DME instructions, Cryotherapy, and Moist heat.  All performed as medically necessary.  All included unless contraindicated  PLAN FOR NEXT SESSION:Lumbar mobility, hip mobility and strengthening     Burnard CHRISTELLA Meth, PT 07/06/2024, 12:00 PM

## 2024-07-06 ENCOUNTER — Ambulatory Visit

## 2024-07-06 DIAGNOSIS — M6281 Muscle weakness (generalized): Secondary | ICD-10-CM

## 2024-07-06 DIAGNOSIS — M25552 Pain in left hip: Secondary | ICD-10-CM | POA: Diagnosis not present

## 2024-07-06 DIAGNOSIS — M25551 Pain in right hip: Secondary | ICD-10-CM

## 2024-07-06 NOTE — Therapy (Deleted)
 OUTPATIENT PHYSICAL THERAPY LOWER EXTREMITY TREATMENT  Patient Name: Christopher Lawrence MRN: [id number] DOB:Apr 20, 1956, 68 y.o., male Today's Date: 07/06/2024  END OF SESSION:  PT End of Session - 07/06/24 1149     Visit Number 2    Number of Visits 10    Date for PT Re-Evaluation 09/03/24    Progress Note Due on Visit 10    PT Start Time 1100    PT Stop Time 1138    PT Time Calculation (min) 38 min    Activity Tolerance Patient tolerated treatment well    Behavior During Therapy WFL for tasks assessed/performed           Past Medical History:  Diagnosis Date   Allergy    GERD (gastroesophageal reflux disease)    Hx of adenomatous polyp of colon 10/27/2016   Labile hypertension    Other testicular hypofunction    Psoriasis    Past Surgical History:  Procedure Laterality Date   COLONOSCOPY  10/2016   COLONOSCOPY N/A 10/2016   HEMORRHOID BANDING  04/2011   HERNIA REPAIR Right 1958   INSERTION OF MESH N/A 08/30/2014   Procedure: INSERTION OF MESH;  Surgeon: Elspeth Schultze, MD;  Location: [facility] OR;  Service: General;  Laterality: N/A;   LAPAROSCOPIC INGUINAL HERNIA WITH UMBILICAL HERNIA N/A 08/30/2014   Procedure: LAPAROSCOPIC EXPLORATION AND REPAIR OF BILATERAL HERNIAS IN  GROINS AND UMBILICAL HERNIA REPAIR.;  Surgeon: Elspeth Schultze, MD;  Location: [facility] OR;  Service: General;  Laterality: N/A;   POLYPECTOMY     Patient Active Problem List   Diagnosis Date Noted   Right hip pain 03/10/2024   Hx of adenomatous polyp of colon 10/27/2016   Vitamin D  deficiency 07/26/2014   Gastroesophageal reflux disease    Labile hypertension    Testosterone  deficiency    Psoriasis     PCP: Chandra Toribio POUR, MD   REFERRING PROVIDER: Vernetta Lonni GRADE*    REFERRING DIAG:  F83.88 (ICD-10-CM) - Unilateral primary osteoarthritis, right hip  M16.12 (ICD-10-CM) - Unilateral primary osteoarthritis, left hip    THERAPY DIAG:  Pain in right hip  Pain in left hip  Muscle weakness  (generalized)  Rationale for Evaluation and Treatment: Rehabilitation  ONSET DATE: 6-8 months  SUBJECTIVE:   SUBJECTIVE STATEMENT: Pt stating Sunday slipped on step and fell on back .  No bruising.  Did not see doctor.  Some back stiffness.   Yesterday had R lateral thigh pain that has dissipated.  Has been able to try the HEP but had pain with supine piriformis stretch.  PERTINENT HISTORY: He does report stiffness and lateral hip pain on the right side has been going on for about 7 to 8 months. He said he is very stiff first thing in the morning. Pt enjoys walking the golf course. Pt reporting pain on lateral right hip.  Foot surgery on left   PAIN:  NPRS scale: 2/10 in Rt hip, 3/10 in left hip 4/10 back Pain description: achy Aggravating factors: sleeping Relieving factors: changing positions  PRECAUTIONS: None  WEIGHT BEARING RESTRICTIONS: No  FALLS:  Has patient fallen in last 6 months? No  LIVING ENVIRONMENT: Lives with: lives with their family and lives with their spouse Lives in: House/apartment Stairs: Yes: External: 1 steps; none split level home with rail on Rt Has following equipment at home: None  OCCUPATION: not currently working  PLOF: Independent  PATIENT GOALS: walk golf course without pain, wake up and be able to walk with upright  posture and less stiffness  Next MD visit: 07/18/24 c Dr. 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   PATIENT SURVEYS:  Patient-Specific Activity Scoring Scheme  0 represents "unable to perform." 10 represents "able to perform at prior level. 0 1 2 3 4 5 6 7 8 9  10 (Date and Score)   Activity Eval      1. Yard work: standing up straight after stooping 7      2. Getting on/off floor/ground  7    3.     4.    5.    Score 7    Total score = sum of the activity scores/number of activities Minimum detectable change (90%CI) for average score = 2 points Minimum detectable change (90%CI) for single activity score = 3 points  COGNITION: Overall cognitive status: WFL    SENSATION: WFL   MUSCLE LENGTH: Positive Thomas test: bil LE's   POSTURE:  rounded shoulders and forward head     LOWER EXTREMITY ROM:   ROM Right eval Left eval  Hip flexion 102 115  Hip extension 12 18  Hip abduction    Hip adduction    Hip internal rotation    Hip external rotation    Knee flexion [facility]  Knee extension    Ankle dorsiflexion    Ankle plantarflexion    Ankle inversion    Ankle eversion     (Blank rows = not tested)  LOWER EXTREMITY MMT:  MMT Right Eval Sitting 06/25/24  Left Eval Sitting 06/25/24  Hip flexion 34.5 39.4  Hip extension    Hip abduction    Hip adduction    Hip internal rotation    Hip external rotation    Knee flexion    Knee extension    Ankle dorsiflexion    Ankle plantarflexion    Ankle inversion    Ankle eversion     (Blank rows = not tested)   GAIT: Distance walked: clinic distances Assistive device utilized: None Level of assistance: Complete Independence Comments:                                                                                                                                                                         TODAY'S TREATMENT  DATE: 07/06/24 Review of HEP including : Hip hikes, standing hip flexor stretch, standing lumbar extensions, seated piriformis stretch. Added ( with VC for posture) lumbar trunk rotations 2x10 SNTC stretch 3x25 sec Tried piriformis stretch minimal pain with 2 times each side   DATE: Therex: HEP instruction/performance c cues for techniques, handout provided.  Trial set  performed of each for comprehension and symptom assessment.  See below for exercise list Self Care:  Pt was instructed to work on posture correction when standing Pt also instructed in sleeping with pillow between his knees when sleeping on his side to help support lumbar spine   PATIENT EDUCATION:  Education details: HEP, POC Person educated: Patient Education method: Programmer, multimedia, Demonstration, Verbal cues, and Handouts Education comprehension: verbalized understanding, returned demonstration, and verbal cues required   HOME EXERCISE PROGRAM: Access Code: [email or web address] URL: [email or web address] Date: 06/25/2024 Prepared by: Delon Lunger  Exercises - Supine Figure 4 Piriformis Stretch  - 2 x daily - 7 x weekly - 3 reps - 15-20 seconds hold - Standing Hip Flexor Stretch  - 2 x daily - 7 x weekly - 3 reps - 15-20 seconds hold - Standing Lumbar Extension at Wall - Forearms  - 2 x daily - 7 x weekly - 10 reps - 10 seconds hold - Standing Hip Hiking  - 2 x daily - 7 x weekly - 2 sets - 10 reps  ASSESSMENT:  CLINICAL IMPRESSION: Pt needed VC and tactile cuing for hip flexor stretch and piriformis stretch adjustments.  Pt demonstrated understanding.  OBJECTIVE IMPAIRMENTS: decreased balance, difficulty walking, decreased ROM, decreased strength, impaired flexibility, and pain.   ACTIVITY LIMITATIONS: bending, squatting, and transfers  PARTICIPATION LIMITATIONS: community activity and yard work  PERSONAL FACTORS: see PMH above are also affecting patient's functional outcome.   REHAB POTENTIAL: Good  CLINICAL DECISION MAKING: Stable/uncomplicated  EVALUATION COMPLEXITY: Low   GOALS: Goals reviewed with patient? Yes  SHORT TERM GOALS: (target date for Short term goals are 3 weeks 07/16/2024)   1.  Patient will demonstrate independent use of home exercise program to maintain progress from in clinic treatments.  Goal status: New  LONG TERM GOALS: (target dates  for all long term goals are 10 weeks  09/03/2024 )   1. Patient will demonstrate/report pain at worst less than or equal to 2/10 to facilitate minimal limitation in daily activity secondary to pain symptoms.  Goal status: New   2. Patient will demonstrate independent use of home exercise program to facilitate ability to maintain/progress functional gains from skilled physical therapy services.  Goal status: New   3. Patient will demonstrate Patient specific functional scale avg > or = 9 to indicate reduced disability due to condition.   Goal status: New   4.  Patient will demonstrate Rt LE MMT >/= 5 pounds of force using HHD throughout to faciltiate usual transfers, stairs, squatting at PLOF for daily life.   Goal status: New   5.  Patient will demonstrate amb >/= 1/2 mile on TM with upright posture with Rt hip pain of </= 2/10.  Goal status: New   6.  Pt will be able to lift 15# from floor to table height using correct body mechanics with bil hip pain of </= 2/10.  Goal status: New      PLAN:  PT FREQUENCY: 1x/week,   PT DURATION: 10 weeks  PLANNED INTERVENTIONS: Can include 02853- PT Re-evaluation, 97110-Therapeutic exercises, 97530- Therapeutic activity, [id number]- Neuromuscular re-education, 97535- Self Care, 97140- Manual therapy, [id number]-  Gait training, 02239- Orthotic Fit/training, 04007- Canalith repositioning, 02886- Aquatic Therapy, [phone]- Electrical stimulation (unattended), [id number] Physical performance testing, 02983- Vasopneumatic device, [id number]- Ultrasound, [id number]- Traction (mechanical), [id number]- Ionotophoresis 4mg /ml Dexamethasone ,  79439 - Needle insertion w/o injection 1 or 2 muscles, 20561 - Needle insertion w/o injection 3 or more muscles.   Patient/Family education, Balance training, Stair training, Taping, Dry Needling, Joint mobilization, Joint manipulation, Spinal manipulation, Spinal mobilization, Scar mobilization, Vestibular training, Visual/preceptual  remediation/compensation, DME instructions, Cryotherapy, and Moist heat.  All performed as medically necessary.  All included unless contraindicated  PLAN FOR NEXT SESSION:Lumbar mobility, hip mobility and strengthening     Burnard CHRISTELLA Meth, PT 07/06/2024, 12:08 PM

## 2024-07-10 NOTE — Therapy (Signed)
 OUTPATIENT PHYSICAL THERAPY LOWER EXTREMITY TREATMENT  Patient Name: Christopher Lawrence MRN: [id number] DOB:1956-07-10, 68 y.o., male Today's Date: 07/10/2024  END OF SESSION:***     Past Medical History:  Diagnosis Date   Allergy    GERD (gastroesophageal reflux disease)    Hx of adenomatous polyp of colon 10/27/2016   Labile hypertension    Other testicular hypofunction    Psoriasis    Past Surgical History:  Procedure Laterality Date   COLONOSCOPY  10/2016   COLONOSCOPY N/A 10/2016   HEMORRHOID BANDING  04/2011   HERNIA REPAIR Right 1958   INSERTION OF MESH N/A 08/30/2014   Procedure: INSERTION OF MESH;  Surgeon: Elspeth Schultze, MD;  Location: [facility] OR;  Service: General;  Laterality: N/A;   LAPAROSCOPIC INGUINAL HERNIA WITH UMBILICAL HERNIA N/A 08/30/2014   Procedure: LAPAROSCOPIC EXPLORATION AND REPAIR OF BILATERAL HERNIAS IN  GROINS AND UMBILICAL HERNIA REPAIR.;  Surgeon: Elspeth Schultze, MD;  Location: [facility] OR;  Service: General;  Laterality: N/A;   POLYPECTOMY     Patient Active Problem List   Diagnosis Date Noted   Right hip pain 03/10/2024   Hx of adenomatous polyp of colon 10/27/2016   Vitamin D  deficiency 07/26/2014   Gastroesophageal reflux disease    Labile hypertension    Testosterone  deficiency    Psoriasis     PCP: Chandra Toribio POUR, MD   REFERRING PROVIDER: Chandra Toribio POUR, MD    REFERRING DIAG:  M16.11 (ICD-10-CM) - Unilateral primary osteoarthritis, right hip  M16.12 (ICD-10-CM) - Unilateral primary osteoarthritis, left hip    THERAPY DIAG:  No diagnosis found.  Rationale for Evaluation and Treatment: Rehabilitation  ONSET DATE: 6-8 months  SUBJECTIVE:   SUBJECTIVE STATEMENT: ***Pt stating Sunday slipped on step and fell on back .  No bruising.  Did not see doctor.  Some back stiffness.   Yesterday had R lateral thigh pain that has dissipated.  Has been able to try the HEP but had pain with supine piriformis stretch.  PERTINENT HISTORY: He does  report stiffness and lateral hip pain on the right side has been going on for about 7 to 8 months. He said he is very stiff first thing in the morning. Pt enjoys walking the golf course. Pt reporting pain on lateral right hip.  Foot surgery on left   PAIN:  ***NPRS scale: 2/10 in Rt hip, 3/10 in left hip 4/10 back Pain description: achy Aggravating factors: sleeping Relieving factors: changing positions  PRECAUTIONS: None  WEIGHT BEARING RESTRICTIONS: No  FALLS:  Has patient fallen in last 6 months? No  LIVING ENVIRONMENT: Lives with: lives with their family and lives with their spouse Lives in: House/apartment Stairs: Yes: External: 1 steps; none split level home with rail on Rt Has following equipment at home: None  OCCUPATION: not currently working  PLOF: Independent  PATIENT GOALS: walk golf course without pain, wake up and be able to walk with upright posture and less stiffness  Next MD visit: 07/18/24 c Dr. 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   PATIENT SURVEYS:  Patient-Specific Activity Scoring Scheme  0 represents "unable to perform." 10 represents "able to perform at prior level. 0 1 2 3 4 5 6 7 8 9  10 (Date and  Score)   Activity Eval      1. Yard work: standing up straight after stooping 7     2. Getting on/off floor/ground  7    3.     4.    5.    Score 7    Total score = sum of the activity scores/number of activities Minimum detectable change (90%CI) for average score = 2 points Minimum detectable change (90%CI) for single activity score = 3 points  COGNITION: Overall cognitive status: WFL    SENSATION: WFL   MUSCLE LENGTH: Positive Thomas test: bil LE's   POSTURE:  rounded  shoulders and forward head     LOWER EXTREMITY ROM:   ROM Right eval Left eval  Hip flexion 102 115  Hip extension 12 18  Hip abduction    Hip adduction    Hip internal rotation    Hip external rotation    Knee flexion [facility]  Knee extension    Ankle dorsiflexion    Ankle plantarflexion    Ankle inversion    Ankle eversion     (Blank rows = not tested)  LOWER EXTREMITY MMT:  MMT Right Eval Sitting 06/25/24  Left Eval Sitting 06/25/24  Hip flexion 34.5 39.4  Hip extension    Hip abduction    Hip adduction    Hip internal rotation    Hip external rotation    Knee flexion    Knee extension    Ankle dorsiflexion    Ankle plantarflexion    Ankle inversion    Ankle eversion     (Blank rows = not tested)   GAIT: Distance walked: clinic distances Assistive device utilized: None Level of assistance: Complete Independence Comments:                                                                                                                                                                         TODAY'S TREATMENT                                                                           07/10/24****     DATE: 07/06/24 Review of HEP including : Hip hikes, standing hip flexor stretch, standing lumbar extensions, seated piriformis stretch. Added ( with VC for posture) lumbar trunk rotations 2x10 SNTC stretch 3x25 sec Tried piriformis stretch minimal pain with 2 times each side   DATE: Therex: HEP instruction/performance c cues for techniques, handout provided.  Trial set performed of each for comprehension and symptom assessment.  See below for exercise list Self Care:  Pt was instructed to work on posture correction when standing Pt also instructed in sleeping with pillow between his knees when sleeping on his side to help support lumbar spine   PATIENT EDUCATION:  Education details: HEP, POC Person educated: Patient Education method: Programmer, multimedia,  Demonstration, Verbal cues, and Handouts Education comprehension: verbalized understanding, returned demonstration, and verbal cues required   HOME EXERCISE PROGRAM: Access Code: [email or web address] URL: [email or web address] Date: 06/25/2024 Prepared by: Delon Lunger  Exercises - Supine Figure 4 Piriformis Stretch  - 2 x daily - 7 x weekly - 3 reps - 15-20 seconds hold - Standing Hip Flexor Stretch  - 2 x daily - 7 x weekly - 3 reps - 15-20 seconds hold - Standing Lumbar Extension at Wall - Forearms  - 2 x daily - 7 x weekly - 10 reps - 10 seconds hold - Standing Hip Hiking  - 2 x daily - 7 x weekly - 2 sets - 10 reps  ASSESSMENT:  CLINICAL IMPRESSION: ****Pt needed VC and tactile cuing for hip flexor stretch and piriformis stretch adjustments.  Pt demonstrated understanding.  OBJECTIVE IMPAIRMENTS: decreased balance, difficulty walking, decreased ROM, decreased strength, impaired flexibility, and pain.   ACTIVITY LIMITATIONS: bending, squatting, and transfers  PARTICIPATION LIMITATIONS: community activity and yard work  PERSONAL FACTORS: see PMH above are also affecting patient's functional outcome.   REHAB POTENTIAL: Good  CLINICAL DECISION MAKING: Stable/uncomplicated  EVALUATION COMPLEXITY: Low   GOALS: Goals reviewed with patient? Yes  SHORT TERM GOALS: (target date for Short term goals are 3 weeks 07/16/2024)   1.  Patient will demonstrate independent use of home exercise program to maintain progress from in clinic treatments.  Goal status: New  LONG TERM GOALS: (target dates for all long term goals are 10 weeks  09/03/2024 )   1. Patient will demonstrate/report pain at worst less than or equal to 2/10 to facilitate minimal limitation in daily activity secondary to pain symptoms.  Goal status: New   2. Patient will demonstrate independent use of home exercise program to facilitate ability to maintain/progress functional gains from skilled physical therapy  services.  Goal status: New   3. Patient will demonstrate Patient specific functional scale avg > or = 9 to indicate reduced disability due to condition.   Goal status: New   4.  Patient will demonstrate Rt LE MMT >/= 5 pounds of force using HHD throughout to faciltiate usual transfers, stairs, squatting at PLOF for daily life.   Goal status: New   5.  Patient will demonstrate amb >/= 1/2 mile on TM with upright posture with Rt hip pain of </= 2/10.  Goal status: New   6.  Pt will be able to lift 15# from floor to table height using correct body mechanics with bil hip pain of </= 2/10.  Goal status: New      PLAN:  PT FREQUENCY: 1x/week,   PT DURATION: 10 weeks  PLANNED INTERVENTIONS: Can include 02853- PT Re-evaluation, 97110-Therapeutic exercises, 97530- Therapeutic activity, [id number]- Neuromuscular re-education, 97535- Self Care, 97140- Manual therapy, [phone]- Gait training, [phone]- Orthotic Fit/training, [phone]- Canalith repositioning, [id number]- Aquatic Therapy, [phone]- Electrical stimulation (unattended), [id number] Physical performance testing, 97016- Vasopneumatic device, [id number]- Ultrasound, [id number]- Traction (mechanical), [id number]- Ionotophoresis 4mg /ml Dexamethasone ,  79439 - Needle insertion w/o injection 1 or 2 muscles, 20561 - Needle insertion w/o injection 3 or more muscles.   Patient/Family education,  Balance training, Stair training, Taping, Dry Needling, Joint mobilization, Joint manipulation, Spinal manipulation, Spinal mobilization, Scar mobilization, Vestibular training, Visual/preceptual remediation/compensation, DME instructions, Cryotherapy, and Moist heat.  All performed as medically necessary.  All included unless contraindicated  PLAN FOR NEXT SESSION:   ****Lumbar mobility, hip mobility and strengthening     Burnard CHRISTELLA Meth, PT 07/10/2024, 8:21 AM

## 2024-07-11 ENCOUNTER — Ambulatory Visit

## 2024-07-11 DIAGNOSIS — M25552 Pain in left hip: Secondary | ICD-10-CM | POA: Diagnosis not present

## 2024-07-11 DIAGNOSIS — M25551 Pain in right hip: Secondary | ICD-10-CM

## 2024-07-11 DIAGNOSIS — M6281 Muscle weakness (generalized): Secondary | ICD-10-CM | POA: Diagnosis not present

## 2024-07-18 ENCOUNTER — Ambulatory Visit: Admitting: Orthopaedic Surgery

## 2024-07-18 ENCOUNTER — Encounter: Payer: Self-pay | Admitting: Orthopaedic Surgery

## 2024-07-18 ENCOUNTER — Ambulatory Visit

## 2024-07-18 DIAGNOSIS — M6281 Muscle weakness (generalized): Secondary | ICD-10-CM

## 2024-07-18 DIAGNOSIS — M1612 Unilateral primary osteoarthritis, left hip: Secondary | ICD-10-CM

## 2024-07-18 DIAGNOSIS — M25552 Pain in left hip: Secondary | ICD-10-CM | POA: Diagnosis not present

## 2024-07-18 DIAGNOSIS — M25551 Pain in right hip: Secondary | ICD-10-CM

## 2024-07-18 DIAGNOSIS — M1611 Unilateral primary osteoarthritis, right hip: Secondary | ICD-10-CM

## 2024-07-18 NOTE — Progress Notes (Signed)
 The patient is a 68 year old gentleman worsening in follow-up after sending him to physical therapy to work on just overall stiffness of his hips and his low back.  He does have known arthritis in both hips with femoral acetabular impingement as well.  However his pain has been minimal.  He says physical therapy is been very helpful for him and he feels less stiff overall.  He has 1 more visit oblique next week.  He did slip on the steps about 2 weeks ago and landed directly on his backside.  He had some low back pain and some right hip pain but he said that is improving each day.  He is walking without assistive device as well.  On exam today he gets up and out of the chair easily.  He looks less stiff.  Even when I put his hips to rotation he does not have any significant pain and the stiffness is much less than what it was when we examined him 6 weeks ago.  Overall he is very pleased with his progress thus far as MRI.  Follow-up can be as needed.  If things worsen he knows to reach out and let us  know.

## 2024-07-18 NOTE — Therapy (Signed)
 OUTPATIENT PHYSICAL THERAPY LOWER EXTREMITY TREATMENT  Patient Name: Christopher Lawrence MRN: [id number] DOB:1956/06/26, 68 y.o., male Today's Date: 07/18/2024  END OF SESSION:  PT End of Session - 07/18/24 1327     Visit Number 4    Number of Visits 10    Date for PT Re-Evaluation 09/03/24    PT Start Time 1300    PT Stop Time 1339    PT Time Calculation (min) 39 min    Activity Tolerance Patient tolerated treatment well    Behavior During Therapy WFL for tasks assessed/performed             Past Medical History:  Diagnosis Date   Allergy    GERD (gastroesophageal reflux disease)    Hx of adenomatous polyp of colon 10/27/2016   Labile hypertension    Other testicular hypofunction    Psoriasis    Past Surgical History:  Procedure Laterality Date   COLONOSCOPY  10/2016   COLONOSCOPY N/A 10/2016   HEMORRHOID BANDING  04/2011   HERNIA REPAIR Right 1958   INSERTION OF MESH N/A 08/30/2014   Procedure: INSERTION OF MESH;  Surgeon: Elspeth Schultze, MD;  Location: [facility] OR;  Service: General;  Laterality: N/A;   LAPAROSCOPIC INGUINAL HERNIA WITH UMBILICAL HERNIA N/A 08/30/2014   Procedure: LAPAROSCOPIC EXPLORATION AND REPAIR OF BILATERAL HERNIAS IN  GROINS AND UMBILICAL HERNIA REPAIR.;  Surgeon: Elspeth Schultze, MD;  Location: [facility] OR;  Service: General;  Laterality: N/A;   POLYPECTOMY     Patient Active Problem List   Diagnosis Date Noted   Right hip pain 03/10/2024   Hx of adenomatous polyp of colon 10/27/2016   Vitamin D  deficiency 07/26/2014   Gastroesophageal reflux disease    Labile hypertension    Testosterone  deficiency    Psoriasis     PCP: Chandra Toribio POUR, MD   REFERRING PROVIDER: Vernetta Lonni GRADE*    REFERRING DIAG:  F83.88 (ICD-10-CM) - Unilateral primary osteoarthritis, right hip  M16.12 (ICD-10-CM) - Unilateral primary osteoarthritis, left hip    THERAPY DIAG:  Pain in right hip  Pain in left hip  Muscle weakness (generalized)  Rationale for  Evaluation and Treatment: Rehabilitation  ONSET DATE: 6-8 months  SUBJECTIVE:   SUBJECTIVE STATEMENT: Pt reports back feeling better with all the stretching.  Hips slowly better. PERTINENT HISTORY: He does report stiffness and lateral hip pain on the right side has been going on for about 7 to 8 months. He said he is very stiff first thing in the morning. Pt enjoys walking the golf course. Pt reporting pain on lateral right hip.  Foot surgery on left   PAIN:  NPRS scale: 2/10 in Rt hip, 2/10 in left hip 2/10 back Pain description: achy Aggravating factors: sleeping Relieving factors: changing positions  PRECAUTIONS: None  WEIGHT BEARING RESTRICTIONS: No  FALLS:  Has patient fallen in last 6 months? No  LIVING ENVIRONMENT: Lives with: lives with their family and lives with their spouse Lives in: House/apartment Stairs: Yes: External: 1 steps; none split level home with rail on Rt Has following equipment at home: None  OCCUPATION: not currently working  PLOF: Independent  PATIENT GOALS: walk golf course without pain, wake up and be able to walk with upright posture and less stiffness  Next MD visit: 07/18/24 c Dr. 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   PATIENT SURVEYS:  Patient-Specific Activity Scoring Scheme  0 represents "unable to perform." 10 represents "able to perform at prior level. 0 1 2 3 4 5 6 7 8 9  10 (Date and Score)   Activity Eval      1. Yard work: standing up straight after stooping 7     2. Getting on/off floor/ground  7    3.     4.    5.    Score 7    Total score = sum of the activity scores/number of activities Minimum detectable change (90%CI) for average score = 2  points Minimum detectable change (90%CI) for single activity score = 3 points  COGNITION: Overall cognitive status: WFL    SENSATION: WFL   MUSCLE LENGTH: Positive Thomas test: bil LE's   POSTURE:  rounded shoulders and forward head     LOWER EXTREMITY ROM:   ROM Right eval Left eval  Hip flexion 102 115  Hip extension 12 18  Hip abduction    Hip adduction    Hip internal rotation    Hip external rotation    Knee flexion [facility]  Knee extension    Ankle dorsiflexion    Ankle plantarflexion    Ankle inversion    Ankle eversion     (Blank rows = not tested)  LOWER EXTREMITY MMT:  MMT Right Eval Sitting 06/25/24  Left Eval Sitting 06/25/24  Hip flexion 34.5 39.4  Hip extension    Hip abduction    Hip adduction    Hip internal rotation    Hip external rotation    Knee flexion    Knee extension    Ankle dorsiflexion    Ankle plantarflexion    Ankle inversion    Ankle eversion     (Blank rows = not tested)   GAIT: Distance walked: clinic distances Assistive device utilized: None Level of assistance: Complete Independence Comments:                                                                                                                                                                         TODAY'S TREATMENT                                                                           07/18/24 There Ex: Rec bike level 2 Leg  press 112# 3x10 Trunk rotations 2x10 SKTC stretch 2x25 sec Neuro Re Ed for balance, posture: Sit to stand with 12# KB 2x10 Standing lumbar extensions 2x10 Prone opp arm/opp leg 2x10 Prone props 10x 10sec  07/11/24 There Ex: Rec bike level 2 8 min Leg press 100# 3x10 Trunk rotations 2x10 SKTC stretch 2x25 sec Neuro Re Ed for balance, posture: Sit to stand with 12# KB 2x10 Standing lumbar extensions 2x10 Prone opp arm/opp leg 2x10 Prone props 10x 10sec   DATE: 07/06/24 Review of HEP including : Hip hikes,  standing hip flexor stretch, standing lumbar extensions, seated piriformis stretch. Added ( with VC for posture) lumbar trunk rotations 2x10 SNTC stretch 3x25 sec Tried piriformis stretch minimal pain with 2 times each side   DATE: Therex: HEP instruction/performance c cues for techniques, handout provided.  Trial set performed of each for comprehension and symptom assessment.  See below for exercise list Self Care:  Pt was instructed to work on posture correction when standing Pt also instructed in sleeping with pillow between his knees when sleeping on his side to help support lumbar spine   PATIENT EDUCATION:  Education details: HEP, POC Person educated: Patient Education method: Programmer, multimedia, Demonstration, Verbal cues, and Handouts Education comprehension: verbalized understanding, returned demonstration, and verbal cues required   HOME EXERCISE PROGRAM: Access Code: [email or web address] URL: [email or web address] Date: 07/11/2024 Prepared by: Burnard Meth  Exercises - Supine Figure 4 Piriformis Stretch  - 2 x daily - 7 x weekly - 3 reps - 15-20 seconds hold - Standing Hip Flexor Stretch  - 2 x daily - 7 x weekly - 3 reps - 15-20 seconds hold - Standing Lumbar Extension at Wall - Forearms  - 2 x daily - 7 x weekly - 10 reps - 10 seconds hold - Standing Hip Hiking  - 2 x daily - 7 x weekly - 2 sets - 10 reps - Seated Piriformis Stretch  - 2 x daily - 7 x weekly - 1 sets - 3 reps - 25 hold - Supine Single Knee to Chest Stretch  - 2 x daily - 7 x weekly - 1 sets - 3 reps - 25 hold - Lower Trunk Rotations  - 2 x daily - 7 x weekly - 2 sets - 10 reps - 2 hold - Prone Alternating Arm and Leg Lifts  - 1 x daily - 7 x weekly - 3 sets - 10 reps - Supine Piriformis Stretch with Foot on Ground  - 1 x daily - 7 x weekly - 3 sets - 10 reps - Prone Alternating Arm and Leg Lifts  - 2 x daily - 7 x weekly - 2 sets - 10 reps - Prone Press Up On Elbows  - 2 x daily - 7 x weekly - 1 sets -  10 reps - 10 hold  ASSESSMENT:  CLINICAL IMPRESSION: Pt needed VC for form with extension exercises.    Demonstrated understanding.  STG completed.  OBJECTIVE IMPAIRMENTS: decreased balance, difficulty walking, decreased ROM, decreased strength, impaired flexibility, and pain.   ACTIVITY LIMITATIONS: bending, squatting, and transfers  PARTICIPATION LIMITATIONS: community activity and yard work  PERSONAL FACTORS: see PMH above are also affecting patient's functional outcome.   REHAB POTENTIAL: Good  CLINICAL DECISION MAKING: Stable/uncomplicated  EVALUATION COMPLEXITY: Low   GOALS: Goals reviewed with patient? Yes  SHORT TERM GOALS: (target date for Short term goals are 3 weeks 07/16/2024)   1.  Patient will demonstrate independent use of home exercise program to  maintain progress from in clinic treatments.  Goal status:MET 07/18/24  LONG TERM GOALS: (target dates for all long term goals are 10 weeks  09/03/2024 )   1. Patient will demonstrate/report pain at worst less than or equal to 2/10 to facilitate minimal limitation in daily activity secondary to pain symptoms.  Goal status: New   2. Patient will demonstrate independent use of home exercise program to facilitate ability to maintain/progress functional gains from skilled physical therapy services.  Goal status: New   3. Patient will demonstrate Patient specific functional scale avg > or = 9 to indicate reduced disability due to condition.   Goal status: New   4.  Patient will demonstrate Rt LE MMT >/= 5 pounds of force using HHD throughout to faciltiate usual transfers, stairs, squatting at PLOF for daily life.   Goal status: New   5.  Patient will demonstrate amb >/= 1/2 mile on TM with upright posture with Rt hip pain of </= 2/10.  Goal status: New   6.  Pt will be able to lift 15# from floor to table height using correct body mechanics with bil hip pain of </= 2/10.  Goal status: New      PLAN:  PT  FREQUENCY: 1x/week,   PT DURATION: 10 weeks  PLANNED INTERVENTIONS: Can include 02853- PT Re-evaluation, 97110-Therapeutic exercises, 97530- Therapeutic activity, [id number]- Neuromuscular re-education, 97535- Self Care, 97140- Manual therapy, [id number]- Gait training, [phone]- Orthotic Fit/training, [phone]- Canalith repositioning, [id number]- Aquatic Therapy, [phone]- Electrical stimulation (unattended), [id number] Physical performance testing, 97016- Vasopneumatic device, [id number]- Ultrasound, [id number]- Traction (mechanical), [id number]- Ionotophoresis 4mg /ml Dexamethasone ,  79439 - Needle insertion w/o injection 1 or 2 muscles, 20561 - Needle insertion w/o injection 3 or more muscles.   Patient/Family education, Balance training, Stair training, Taping, Dry Needling, Joint mobilization, Joint manipulation, Spinal manipulation, Spinal mobilization, Scar mobilization, Vestibular training, Visual/preceptual remediation/compensation, DME instructions, Cryotherapy, and Moist heat.  All performed as medically necessary.  All included unless contraindicated  PLAN FOR NEXT SESSION: Progress stabilization as tolerated.    Burnard CHRISTELLA Meth, PT 07/18/2024, 1:28 PM

## 2024-07-23 ENCOUNTER — Encounter: Payer: Self-pay | Admitting: Physical Therapy

## 2024-07-23 ENCOUNTER — Ambulatory Visit: Admitting: Physical Therapy

## 2024-07-23 DIAGNOSIS — M6281 Muscle weakness (generalized): Secondary | ICD-10-CM

## 2024-07-23 DIAGNOSIS — M25552 Pain in left hip: Secondary | ICD-10-CM | POA: Diagnosis not present

## 2024-07-23 DIAGNOSIS — M25551 Pain in right hip: Secondary | ICD-10-CM

## 2024-07-23 NOTE — Therapy (Signed)
 OUTPATIENT PHYSICAL THERAPY LOWER EXTREMITY TREATMENT  Patient Name: Christopher Lawrence MRN: [id number] DOB:1956-01-11, 68 y.o., male Today's Date: 07/23/2024  END OF SESSION:  PT End of Session - 07/23/24 1216     Visit Number 5    Number of Visits 10    Date for Recertification  09/03/24    Progress Note Due on Visit 10    PT Start Time 1150    PT Stop Time 1230    PT Time Calculation (min) 40 min    Activity Tolerance Patient tolerated treatment well    Behavior During Therapy [facility] for tasks assessed/performed              Past Medical History:  Diagnosis Date   Allergy    GERD (gastroesophageal reflux disease)    Hx of adenomatous polyp of colon 10/27/2016   Labile hypertension    Other testicular hypofunction    Psoriasis    Past Surgical History:  Procedure Laterality Date   COLONOSCOPY  10/2016   COLONOSCOPY N/A 10/2016   HEMORRHOID BANDING  04/2011   HERNIA REPAIR Right 1958   INSERTION OF MESH N/A 08/30/2014   Procedure: INSERTION OF MESH;  Surgeon: Elspeth Schultze, MD;  Location: [facility] OR;  Service: General;  Laterality: N/A;   LAPAROSCOPIC INGUINAL HERNIA WITH UMBILICAL HERNIA N/A 08/30/2014   Procedure: LAPAROSCOPIC EXPLORATION AND REPAIR OF BILATERAL HERNIAS IN  GROINS AND UMBILICAL HERNIA REPAIR.;  Surgeon: Elspeth Schultze, MD;  Location: [facility] OR;  Service: General;  Laterality: N/A;   POLYPECTOMY     Patient Active Problem List   Diagnosis Date Noted   Right hip pain 03/10/2024   Hx of adenomatous polyp of colon 10/27/2016   Vitamin D  deficiency 07/26/2014   Gastroesophageal reflux disease    Labile hypertension    Testosterone  deficiency    Psoriasis     PCP: Chandra Toribio POUR, MD   REFERRING PROVIDER: Vernetta Lonni GRADE*    REFERRING DIAG:  F83.88 (ICD-10-CM) - Unilateral primary osteoarthritis, right hip  M16.12 (ICD-10-CM) - Unilateral primary osteoarthritis, left hip    THERAPY DIAG:  Pain in right hip  Pain in left hip  Muscle weakness  (generalized)  Rationale for Evaluation and Treatment: Rehabilitation  ONSET DATE: 6-8 months  SUBJECTIVE:   SUBJECTIVE STATEMENT: Pt reporting 1/10 pain in his low back upon arrival, 3/10 pain this morning when first waking up.   PERTINENT HISTORY: He does report stiffness and lateral hip pain on the right side has been going on for about 7 to 8 months. He said he is very stiff first thing in the morning. Pt enjoys walking the golf course. Pt reporting pain on lateral right hip.  Foot surgery on left   PAIN:  NPRS scale: 3/10 in low back this morning, and 1/10 upon arrival in bilateral low back Pain description: achy Aggravating factors: sleeping Relieving factors: changing positions  PRECAUTIONS: None  WEIGHT BEARING RESTRICTIONS: No  FALLS:  Has patient fallen in last 6 months? No  LIVING ENVIRONMENT: Lives with: lives with their family and lives with their spouse Lives in: House/apartment Stairs: Yes: External: 1 steps; none split level home with rail on Rt Has following equipment at home: None  OCCUPATION: not currently working  PLOF: Independent  PATIENT GOALS: walk golf course without pain, wake up and be able to walk with upright posture and less stiffness.   Next MD visit: 07/18/24 c Dr. 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   PATIENT SURVEYS:  Patient-Specific Activity Scoring Scheme  0 represents "unable to perform." 10 represents "able to perform at prior level. 0 1 2 3 4 5 6 7 8 9  10 (Date and Score)   Activity Eval      1. Yard work: standing up straight after stooping 7     2. Getting on/off floor/ground  7    3.     4.    5.    Score 7    Total score = sum of the activity  scores/number of activities Minimum detectable change (90%CI) for average score = 2 points Minimum detectable change (90%CI) for single activity score = 3 points  COGNITION: Overall cognitive status: WFL    SENSATION: WFL   MUSCLE LENGTH: Positive Thomas test: bil LE's   POSTURE:  rounded shoulders and forward head     LOWER EXTREMITY ROM:   ROM Right eval Left eval  Hip flexion 102 115  Hip extension 12 18  Hip abduction    Hip adduction    Hip internal rotation    Hip external rotation    Knee flexion [facility]  Knee extension    Ankle dorsiflexion    Ankle plantarflexion    Ankle inversion    Ankle eversion     (Blank rows = not tested)  LOWER EXTREMITY MMT:  MMT Right Eval Sitting 06/25/24  Left Eval Sitting 06/25/24  Hip flexion 34.5 39.4  Hip extension    Hip abduction    Hip adduction    Hip internal rotation    Hip external rotation    Knee flexion    Knee extension    Ankle dorsiflexion    Ankle plantarflexion    Ankle inversion    Ankle eversion     (Blank rows = not tested)   GAIT: Distance walked: clinic distances Assistive device utilized: None Level of assistance: Complete Independence Comments:                                                                                                                                                                         TODAY'S TREATMENT  07/23/24:  TherEx Recumbent bike: Level 3 x 5 minutes Neuro Re-Ed Cat/cow: x 10 holding 5 sec Quadraped: opposite arm/leg x 10  TherAct Double Leg Press: 87# 3 x 10  Rows: blue TB 2 x 15 holding 3 sec Standing trunk extension: elbows on the wall x 10 holding 5 sec Standing bil shoulder flexion c 2 # bar, ball on the wall in mini squat position 2 x 10     07/18/24 There Ex: Rec bike level 2 Leg press 112# 3x10 Trunk rotations 2x10 SKTC stretch 2x25 sec Neuro Re Ed  for balance, posture: Sit to stand with 12# KB 2x10 Standing lumbar extensions 2x10 Prone opp arm/opp leg 2x10 Prone props 10x 10sec  07/11/24 There Ex: Rec bike level 2 8 min Leg press 100# 3x10 Trunk rotations 2x10 SKTC stretch 2x25 sec Neuro Re Ed for balance, posture: Sit to stand with 12# KB 2x10 Standing lumbar extensions 2x10 Prone opp arm/opp leg 2x10 Prone props 10x 10sec   DATE: 07/06/24 Review of HEP including : Hip hikes, standing hip flexor stretch, standing lumbar extensions, seated piriformis stretch. Added ( with VC for posture) lumbar trunk rotations 2x10 SNTC stretch 3x25 sec Tried piriformis stretch minimal pain with 2 times each side      PATIENT EDUCATION:  Education details: HEP, POC Person educated: Patient Education method: Programmer, multimedia, Facilities manager, Verbal cues, and Handouts Education comprehension: verbalized understanding, returned demonstration, and verbal cues required   HOME EXERCISE PROGRAM: Access Code: [email or web address] URL: [email or web address] Date: 07/11/2024 Prepared by: Burnard Meth  Exercises - Supine Figure 4 Piriformis Stretch  - 2 x daily - 7 x weekly - 3 reps - 15-20 seconds hold - Standing Hip Flexor Stretch  - 2 x daily - 7 x weekly - 3 reps - 15-20 seconds hold - Standing Lumbar Extension at Wall - Forearms  - 2 x daily - 7 x weekly - 10 reps - 10 seconds hold - Standing Hip Hiking  - 2 x daily - 7 x weekly - 2 sets - 10 reps - Seated Piriformis Stretch  - 2 x daily - 7 x weekly - 1 sets - 3 reps - 25 hold - Supine Single Knee to Chest Stretch  - 2 x daily - 7 x weekly - 1 sets - 3 reps - 25 hold - Lower Trunk Rotations  - 2 x daily - 7 x weekly - 2 sets - 10 reps - 2 hold - Prone Alternating Arm and Leg Lifts  - 1 x daily - 7 x weekly - 3 sets - 10 reps - Supine Piriformis Stretch with Foot on Ground  - 1 x daily - 7 x weekly - 3 sets - 10 reps - Prone Alternating Arm and Leg Lifts  - 2 x daily - 7 x weekly - 2  sets - 10 reps - Prone Press Up On Elbows  - 2 x daily - 7 x weekly - 1 sets - 10 reps - 10 hold  ASSESSMENT:  CLINICAL IMPRESSION: Pt tolerating exercises well with no reports of increased pain. Pt needed added instructions for new Quadraped exercises for techniques as well as the cat/cow. Pt issued blue TB for rows and shoulder extension strengthening at home. Recommending continued skilled pt interventions.   OBJECTIVE IMPAIRMENTS: decreased balance, difficulty walking, decreased ROM, decreased strength, impaired flexibility, and pain.   ACTIVITY LIMITATIONS: bending, squatting, and transfers  PARTICIPATION LIMITATIONS: community activity and yard work  PERSONAL FACTORS: see PMH above are also  affecting patient's functional outcome.   REHAB POTENTIAL: Good  CLINICAL DECISION MAKING: Stable/uncomplicated  EVALUATION COMPLEXITY: Low   GOALS: Goals reviewed with patient? Yes  SHORT TERM GOALS: (target date for Short term goals are 3 weeks 07/16/2024)   1.  Patient will demonstrate independent use of home exercise program to maintain progress from in clinic treatments.  Goal status:MET 07/18/24  LONG TERM GOALS: (target dates for all long term goals are 10 weeks  09/03/2024 )   1. Patient will demonstrate/report pain at worst less than or equal to 2/10 to facilitate minimal limitation in daily activity secondary to pain symptoms.  Goal status: New   2. Patient will demonstrate independent use of home exercise program to facilitate ability to maintain/progress functional gains from skilled physical therapy services.  Goal status: New   3. Patient will demonstrate Patient specific functional scale avg > or = 9 to indicate reduced disability due to condition.   Goal status: New   4.  Patient will demonstrate Rt LE MMT >/= 5 pounds of force using HHD throughout to faciltiate usual transfers, stairs, squatting at PLOF for daily life.   Goal status: New   5.  Patient will  demonstrate amb >/= 1/2 mile on TM with upright posture with Rt hip pain of </= 2/10.  Goal status: New   6.  Pt will be able to lift 15# from floor to table height using correct body mechanics with bil hip pain of </= 2/10.  Goal status: New      PLAN:  PT FREQUENCY: 1x/week,   PT DURATION: 10 weeks  PLANNED INTERVENTIONS: Can include 02853- PT Re-evaluation, 97110-Therapeutic exercises, 97530- Therapeutic activity, [id number]- Neuromuscular re-education, 97535- Self Care, 97140- Manual therapy, [phone]- Gait training, [phone]- Orthotic Fit/training, [phone]- Canalith repositioning, [id number]- Aquatic Therapy, [phone]- Electrical stimulation (unattended), [id number] Physical performance testing, 97016- Vasopneumatic device, [id number]- Ultrasound, [id number]- Traction (mechanical), [id number]- Ionotophoresis 4mg /ml Dexamethasone ,  79439 - Needle insertion w/o injection 1 or 2 muscles, 20561 - Needle insertion w/o injection 3 or more muscles.   Patient/Family education, Balance training, Stair training, Taping, Dry Needling, Joint mobilization, Joint manipulation, Spinal manipulation, Spinal mobilization, Scar mobilization, Vestibular training, Visual/preceptual remediation/compensation, DME instructions, Cryotherapy, and Moist heat.  All performed as medically necessary.  All included unless contraindicated  PLAN FOR NEXT SESSION: Progress stabilization as tolerated, lumbar strengthening Work on using dumbells for strengthening (pt is thinking of purchasing some for home)    Delon JONELLE Lunger, PT, MPT 07/23/2024, 12:33 PM

## 2024-07-29 NOTE — Therapy (Signed)
 OUTPATIENT PHYSICAL THERAPY LOWER EXTREMITY TREATMENT  Patient Name: Christopher Lawrence MRN: [id number] DOB:[birth year], 68 y.o., male Today's Date: 07/30/2024  END OF SESSION:  PT End of Session - 07/30/24 1056     Visit Number 6    Number of Visits 10    Date for Recertification  09/03/24    PT Start Time 1015    PT Stop Time 1053    PT Time Calculation (min) 38 min    Activity Tolerance Patient tolerated treatment well    Behavior During Therapy [facility] for tasks assessed/performed               Past Medical History:  Diagnosis Date   Allergy    GERD (gastroesophageal reflux disease)    Hx of adenomatous polyp of colon 10/27/2016   Labile hypertension    Other testicular hypofunction    Psoriasis    Past Surgical History:  Procedure Laterality Date   COLONOSCOPY  10/2016   COLONOSCOPY N/A 10/2016   HEMORRHOID BANDING  04/2011   HERNIA REPAIR Right 1958   INSERTION OF MESH N/A 08/30/2014   Procedure: INSERTION OF MESH;  Surgeon: Elspeth Schultze, MD;  Location: [facility] OR;  Service: General;  Laterality: N/A;   LAPAROSCOPIC INGUINAL HERNIA WITH UMBILICAL HERNIA N/A 08/30/2014   Procedure: LAPAROSCOPIC EXPLORATION AND REPAIR OF BILATERAL HERNIAS IN  GROINS AND UMBILICAL HERNIA REPAIR.;  Surgeon: Elspeth Schultze, MD;  Location: [facility] OR;  Service: General;  Laterality: N/A;   POLYPECTOMY     Patient Active Problem List   Diagnosis Date Noted   Right hip pain 03/10/2024   Hx of adenomatous polyp of colon 10/27/2016   Vitamin D  deficiency 07/26/2014   Gastroesophageal reflux disease    Labile hypertension    Testosterone  deficiency    Psoriasis     PCP: Chandra Toribio POUR, MD   REFERRING PROVIDER: Vernetta Lonni GRADE*    REFERRING DIAG:  F83.88 (ICD-10-CM) - Unilateral primary osteoarthritis, right hip  M16.12 (ICD-10-CM) - Unilateral primary osteoarthritis, left hip    THERAPY DIAG:  Pain in right hip  Pain in left hip  Muscle weakness (generalized)  Rationale for  Evaluation and Treatment: Rehabilitation  ONSET DATE: 6-8 months  SUBJECTIVE:   SUBJECTIVE STATEMENT: Pt states LBP still improving.   PERTINENT HISTORY: He does report stiffness and lateral hip pain on the right side has been going on for about 7 to 8 months. He said he is very stiff first thing in the morning. Pt enjoys walking the golf course. Pt reporting pain on lateral right hip.  Foot surgery on left   PAIN:  NPRS scale: 1/10 in low back today. Pain description: achy Aggravating factors: sleeping Relieving factors: changing positions  PRECAUTIONS: None  WEIGHT BEARING RESTRICTIONS: No  FALLS:  Has patient fallen in last 6 months? No  LIVING ENVIRONMENT: Lives with: lives with their family and lives with their spouse Lives in: House/apartment Stairs: Yes: External: 1 steps; none split level home with rail on Rt Has following equipment at home: None  OCCUPATION: not currently working  PLOF: Independent  PATIENT GOALS: walk golf course without pain, wake up and be able to walk with upright posture and less stiffness.   Next MD visit: 07/18/24 c Dr. 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   PATIENT SURVEYS:  Patient-Specific Activity Scoring Scheme  0 represents "unable to perform." 10 represents "able to perform at prior level. 0 1 2 3 4 5 6 7 8 9  10 (Date and Score)   Activity Eval      1. Yard work: standing up straight after stooping 7     2. Getting on/off floor/ground  7    3.     4.    5.    Score 7    Total score = sum of the activity scores/number of activities Minimum detectable change (90%CI) for average score = 2 points Minimum detectable change (90%CI) for single activity score =  3 points  COGNITION: Overall cognitive status: WFL    SENSATION: WFL   MUSCLE LENGTH: Positive Thomas test: bil LE's   POSTURE:  rounded shoulders and forward head     LOWER EXTREMITY ROM:   ROM Right eval Left eval  Hip flexion 102 115  Hip extension 12 18  Hip abduction    Hip adduction    Hip internal rotation    Hip external rotation    Knee flexion [facility]  Knee extension    Ankle dorsiflexion    Ankle plantarflexion    Ankle inversion    Ankle eversion     (Blank rows = not tested)  LOWER EXTREMITY MMT:  MMT Right Eval Sitting 06/25/24  Left Eval Sitting 06/25/24  Hip flexion 34.5 39.4  Hip extension    Hip abduction    Hip adduction    Hip internal rotation    Hip external rotation    Knee flexion    Knee extension    Ankle dorsiflexion    Ankle plantarflexion    Ankle inversion    Ankle eversion     (Blank rows = not tested)   GAIT: Distance walked: clinic distances Assistive device utilized: None Level of assistance: Complete Independence Comments:                                                                                                                                                                         TODAY'S TREATMENT                                                                           07/30/24 TherEx Recumbent bike: Level 3 x 7 minutes Extension with 1# bar 2x10 Neuro Re-Ed Cat/cow:  x 10 holding 5 sec Quadraped: opposite arm/leg x 10  TherAct Double Leg Press: 87# 3 x 10  Rows: blue TB 2 x 15 holding 3 sec Extension tband blue 3x10 Lifting kettle bell off floor with squat  15# 2x8 Standing trunk extension: elbows on the wall x 10 holding 5 sec Standing bil shoulder flexion c 2 # bar, ball on the wall in mini squat position 2 x 10     07/23/24:  TherEx Recumbent bike: Level 3 x 5 minutes Neuro Re-Ed Cat/cow: x 10 holding 5 sec Quadraped: opposite arm/leg x 10  TherAct Double Leg Press: 87# 3 x 10   Rows: blue TB 2 x 15 holding 3 sec Standing trunk extension: elbows on the wall x 10 holding 5 sec Standing bil shoulder flexion c 2 # bar, ball on the wall in mini squat position 2 x 10     07/18/24 There Ex: Rec bike level 2 Leg press 112# 3x10 Trunk rotations 2x10 SKTC stretch 2x25 sec Neuro Re Ed for balance, posture: Sit to stand with 12# KB 2x10 Standing lumbar extensions 2x10 Prone opp arm/opp leg 2x10 Prone props 10x 10sec  07/11/24 There Ex: Rec bike level 2 8 min Leg press 100# 3x10 Trunk rotations 2x10 SKTC stretch 2x25 sec Neuro Re Ed for balance, posture: Sit to stand with 12# KB 2x10 Standing lumbar extensions 2x10 Prone opp arm/opp leg 2x10 Prone props 10x 10sec   DATE: 07/06/24 Review of HEP including : Hip hikes, standing hip flexor stretch, standing lumbar extensions, seated piriformis stretch. Added ( with VC for posture) lumbar trunk rotations 2x10 SNTC stretch 3x25 sec Tried piriformis stretch minimal pain with 2 times each side      PATIENT EDUCATION:  Education details: HEP, POC Person educated: Patient Education method: Programmer, multimedia, Facilities manager, Verbal cues, and Handouts Education comprehension: verbalized understanding, returned demonstration, and verbal cues required   HOME EXERCISE PROGRAM: Access Code: [email or web address] URL: [email or web address] Date: 07/11/2024 Prepared by: Burnard Meth  Exercises - Supine Figure 4 Piriformis Stretch  - 2 x daily - 7 x weekly - 3 reps - 15-20 seconds hold - Standing Hip Flexor Stretch  - 2 x daily - 7 x weekly - 3 reps - 15-20 seconds hold - Standing Lumbar Extension at Wall - Forearms  - 2 x daily - 7 x weekly - 10 reps - 10 seconds hold - Standing Hip Hiking  - 2 x daily - 7 x weekly - 2 sets - 10 reps - Seated Piriformis Stretch  - 2 x daily - 7 x weekly - 1 sets - 3 reps - 25 hold - Supine Single Knee to Chest Stretch  - 2 x daily - 7 x weekly - 1 sets - 3 reps - 25 hold - Lower  Trunk Rotations  - 2 x daily - 7 x weekly - 2 sets - 10 reps - 2 hold - Prone Alternating Arm and Leg Lifts  - 1 x daily - 7 x weekly - 3 sets - 10 reps - Supine Piriformis Stretch with Foot on Ground  - 1 x daily - 7 x weekly - 3 sets - 10 reps - Prone Alternating Arm and Leg Lifts  - 2 x daily - 7 x weekly - 2 sets - 10 reps - Prone Press Up On Elbows  - 2 x daily - 7 x weekly - 1 sets - 10 reps - 10 hold  ASSESSMENT:  CLINICAL IMPRESSION: Pt needed VC for  standing posture with extensions and bar activities.  Some LTG completed.  Progressing well. OBJECTIVE IMPAIRMENTS: decreased balance, difficulty walking, decreased ROM, decreased strength, impaired flexibility, and pain.   ACTIVITY LIMITATIONS: bending, squatting, and transfers  PARTICIPATION LIMITATIONS: community activity and yard work  PERSONAL FACTORS: see PMH above are also affecting patient's functional outcome.   REHAB POTENTIAL: Good  CLINICAL DECISION MAKING: Stable/uncomplicated  EVALUATION COMPLEXITY: Low   GOALS: Goals reviewed with patient? Yes  SHORT TERM GOALS: (target date for Short term goals are 3 weeks 07/16/2024)   1.  Patient will demonstrate independent use of home exercise program to maintain progress from in clinic treatments.  Goal status:MET 07/18/24  LONG TERM GOALS: (target dates for all long term goals are 10 weeks  09/03/2024 )   1. Patient will demonstrate/report pain at worst less than or equal to 2/10 to facilitate minimal limitation in daily activity secondary to pain symptoms.  Goal status: New   2. Patient will demonstrate independent use of home exercise program to facilitate ability to maintain/progress functional gains from skilled physical therapy services.  Goal status: New   3. Patient will demonstrate Patient specific functional scale avg > or = 9 to indicate reduced disability due to condition.   Goal status: New   4.  Patient will demonstrate Rt LE MMT >/= 5 pounds of  force using HHD throughout to faciltiate usual transfers, stairs, squatting at PLOF for daily life.   Goal status: New   5.  Patient will demonstrate amb >/= 1/2 mile on TM with upright posture with Rt hip pain of </= 2/10.  Goal status: MET 07/30/24   6.  Pt will be able to lift 15# from floor to table height using correct body mechanics with bil hip pain of </= 2/10.  Goal status: MET 07/30/24    PLAN:  PT FREQUENCY: 1x/week,   PT DURATION: 10 weeks  PLANNED INTERVENTIONS: Can include 02853- PT Re-evaluation, 97110-Therapeutic exercises, 97530- Therapeutic activity, 97112- Neuromuscular re-education, 97535- Self Care, 97140- Manual therapy, [phone]- Gait training, [phone]- Orthotic Fit/training, [phone]- Canalith repositioning, [id number]- Aquatic Therapy, [phone]- Electrical stimulation (unattended), [id number] Physical performance testing, 97016- Vasopneumatic device, [id number]- Ultrasound, [id number]- Traction (mechanical), [id number]- Ionotophoresis 4mg /ml Dexamethasone ,  79439 - Needle insertion w/o injection 1 or 2 muscles, 20561 - Needle insertion w/o injection 3 or more muscles.   Patient/Family education, Balance training, Stair training, Taping, Dry Needling, Joint mobilization, Joint manipulation, Spinal manipulation, Spinal mobilization, Scar mobilization, Vestibular training, Visual/preceptual remediation/compensation, DME instructions, Cryotherapy, and Moist heat.  All performed as medically necessary.  All included unless contraindicated  PLAN FOR NEXT SESSION: Progress stabilization as tolerated, lumbar strengthening  Burnard CHRISTELLA Meth, PT, MPT 07/30/2024, 10:57 AM

## 2024-07-30 ENCOUNTER — Ambulatory Visit (INDEPENDENT_AMBULATORY_CARE_PROVIDER_SITE_OTHER)

## 2024-07-30 DIAGNOSIS — M6281 Muscle weakness (generalized): Secondary | ICD-10-CM | POA: Diagnosis not present

## 2024-07-30 DIAGNOSIS — M25552 Pain in left hip: Secondary | ICD-10-CM | POA: Diagnosis not present

## 2024-07-30 DIAGNOSIS — M25551 Pain in right hip: Secondary | ICD-10-CM | POA: Diagnosis not present

## 2024-08-07 ENCOUNTER — Ambulatory Visit: Admitting: Physical Therapy

## 2024-08-07 ENCOUNTER — Encounter: Payer: Self-pay | Admitting: Physical Therapy

## 2024-08-07 DIAGNOSIS — M6281 Muscle weakness (generalized): Secondary | ICD-10-CM

## 2024-08-07 DIAGNOSIS — M25551 Pain in right hip: Secondary | ICD-10-CM

## 2024-08-07 DIAGNOSIS — M25552 Pain in left hip: Secondary | ICD-10-CM | POA: Diagnosis not present

## 2024-08-07 NOTE — Therapy (Signed)
 OUTPATIENT PHYSICAL THERAPY LOWER EXTREMITY TREATMENT  Patient Name: Christopher Lawrence MRN: [id number] DOB:17-Oct-1956, 68 y.o., male Today's Date: 08/07/2024  END OF SESSION:  PT End of Session - 08/07/24 1032     Visit Number 7    Number of Visits 10    Date for Recertification  09/03/24    Progress Note Due on Visit 10    PT Start Time 1018    PT Stop Time 1058    PT Time Calculation (min) 40 min    Activity Tolerance Patient tolerated treatment well    Behavior During Therapy WFL for tasks assessed/performed                Past Medical History:  Diagnosis Date   Allergy    GERD (gastroesophageal reflux disease)    Hx of adenomatous polyp of colon 10/27/2016   Labile hypertension    Other testicular hypofunction    Psoriasis    Past Surgical History:  Procedure Laterality Date   COLONOSCOPY  10/2016   COLONOSCOPY N/A 10/2016   HEMORRHOID BANDING  04/2011   HERNIA REPAIR Right 1958   INSERTION OF MESH N/A 08/30/2014   Procedure: INSERTION OF MESH;  Surgeon: Elspeth Schultze, MD;  Location: [facility] OR;  Service: General;  Laterality: N/A;   LAPAROSCOPIC INGUINAL HERNIA WITH UMBILICAL HERNIA N/A 08/30/2014   Procedure: LAPAROSCOPIC EXPLORATION AND REPAIR OF BILATERAL HERNIAS IN  GROINS AND UMBILICAL HERNIA REPAIR.;  Surgeon: Elspeth Schultze, MD;  Location: [facility] OR;  Service: General;  Laterality: N/A;   POLYPECTOMY     Patient Active Problem List   Diagnosis Date Noted   Right hip pain 03/10/2024   Hx of adenomatous polyp of colon 10/27/2016   Vitamin D  deficiency 07/26/2014   Gastroesophageal reflux disease    Labile hypertension    Testosterone  deficiency    Psoriasis     PCP: Chandra Toribio POUR, MD   REFERRING PROVIDER: Vernetta Lonni GRADE*    REFERRING DIAG:  F83.88 (ICD-10-CM) - Unilateral primary osteoarthritis, right hip  M16.12 (ICD-10-CM) - Unilateral primary osteoarthritis, left hip    THERAPY DIAG:  Pain in right hip  Pain in left hip  Muscle  weakness (generalized)  Rationale for Evaluation and Treatment: Rehabilitation  ONSET DATE: 6-8 months  SUBJECTIVE:   SUBJECTIVE STATEMENT: Pt stating his pain has moved into his low back and less in his hips. Pt stating he took a 1.5 mile walk this morning.    PERTINENT HISTORY: He does report stiffness and lateral hip pain on the right side has been going on for about 7 to 8 months. He said he is very stiff first thing in the morning. Pt enjoys walking the golf course. Pt reporting pain on lateral right hip.  Foot surgery on left   PAIN:  NPRS scale: 1/10 more in low back Pain description: achy Aggravating factors: sleeping Relieving factors: changing positions  PRECAUTIONS: None  WEIGHT BEARING RESTRICTIONS: No  FALLS:  Has patient fallen in last 6 months? No  LIVING ENVIRONMENT: Lives with: lives with their family and lives with their spouse Lives in: House/apartment Stairs: Yes: External: 1 steps; none split level home with rail on Rt Has following equipment at home: None  OCCUPATION: not currently working  PLOF: Independent  PATIENT GOALS: walk golf course without pain, wake up and be able to walk with upright posture and less stiffness.   Next MD visit: 07/18/24 c Dr. 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   PATIENT SURVEYS:  Patient-Specific Activity Scoring Scheme  0 represents "unable to perform." 10 represents "able to perform at prior level. 0 1 2 3 4 5 6 7 8 9  10 (Date and Score)   Activity Eval      1. Yard work: standing up straight after stooping 7     2. Getting on/off floor/ground  7    3.     4.    5.    Score 7    Total score = sum of the activity scores/number of  activities Minimum detectable change (90%CI) for average score = 2 points Minimum detectable change (90%CI) for single activity score = 3 points  COGNITION: Overall cognitive status: WFL    SENSATION: WFL   MUSCLE LENGTH: Positive Thomas test: bil LE's   POSTURE:  rounded shoulders and forward head     LOWER EXTREMITY ROM:   ROM Right eval Left eval  Hip flexion 102 115  Hip extension 12 18  Hip abduction    Hip adduction    Hip internal rotation    Hip external rotation    Knee flexion [facility]  Knee extension    Ankle dorsiflexion    Ankle plantarflexion    Ankle inversion    Ankle eversion     (Blank rows = not tested)  LOWER EXTREMITY MMT:  MMT Right Eval Sitting 06/25/24  Left Eval Sitting 06/25/24  Hip flexion 34.5 39.4  Hip extension    Hip abduction    Hip adduction    Hip internal rotation    Hip external rotation    Knee flexion    Knee extension    Ankle dorsiflexion    Ankle plantarflexion    Ankle inversion    Ankle eversion     (Blank rows = not tested)   GAIT: Distance walked: clinic distances Assistive device utilized: None Level of assistance: Complete Independence Comments:                                                                                                                                                                         TODAY'S TREATMENT  08/07/24 TherEx Recumbent bike: Level 3 x 5 minutes Neuro Re-Ed Cat/cow: x 5 holding 10 sec Quadraped: opposite arm/leg x 10  Modified one thousand abdominal strengthening and core stability: 5 x pulses of 10  Dead bug: opposite arm/leg core activation TherAct Double Leg Press: 93# 2 x 15   Rows: blue TB 2 x 15 holding 3 sec Extension tband blue 2 x 15 holding 3 sec TRX squats 2 x 10    TODAY'S TREATMENT                                                                            07/30/24 TherEx Recumbent bike: Level 3 x 7 minutes Extension with 1# bar 2x10 Neuro Re-Ed Cat/cow: x 10 holding 5 sec Quadraped: opposite arm/leg x 10  TherAct Double Leg Press: 87# 3 x 10  Rows: blue TB 2 x 15 holding 3 sec Extension tband blue 3x10 Lifting kettle bell off floor with squat  15# 2x8 Standing trunk extension: elbows on the wall x 10 holding 5 sec Standing bil shoulder flexion c 2 # bar, ball on the wall in mini squat position 2 x 10     07/23/24:  TherEx Recumbent bike: Level 3 x 5 minutes Neuro Re-Ed Cat/cow: x 10 holding 5 sec Quadraped: opposite arm/leg x 10  TherAct Double Leg Press: 87# 3 x 10  Rows: blue TB 2 x 15 holding 3 sec Standing trunk extension: elbows on the wall x 10 holding 5 sec Standing bil shoulder flexion c 2 # bar, ball on the wall in mini squat position 2 x 10     PATIENT EDUCATION:  Education details: HEP, POC Person educated: Patient Education method: Programmer, multimedia, Demonstration, Verbal cues, and Handouts Education comprehension: verbalized understanding, returned demonstration, and verbal cues required   HOME EXERCISE PROGRAM: Access Code: [email or web address] URL: [email or web address] Date: 07/11/2024 Prepared by: Burnard Meth  Exercises - Supine Figure 4 Piriformis Stretch  - 2 x daily - 7 x weekly - 3 reps - 15-20 seconds hold - Standing Hip Flexor Stretch  - 2 x daily - 7 x weekly - 3 reps - 15-20 seconds hold - Standing Lumbar Extension at Wall - Forearms  - 2 x daily - 7 x weekly - 10 reps - 10 seconds hold - Standing Hip Hiking  - 2 x daily - 7 x weekly - 2 sets - 10 reps - Seated Piriformis Stretch  - 2 x daily - 7 x weekly - 1 sets - 3 reps - 25 hold - Supine Single Knee to Chest Stretch  - 2 x daily - 7 x weekly - 1 sets - 3 reps - 25 hold - Lower Trunk Rotations  - 2 x daily - 7 x weekly - 2 sets - 10 reps - 2 hold - Prone Alternating Arm and Leg Lifts  - 1 x daily - 7 x weekly - 3 sets - 10 reps - Supine  Piriformis Stretch with Foot on Ground  - 1 x daily - 7 x weekly - 3 sets - 10 reps - Prone Alternating Arm and Leg Lifts  - 2 x daily - 7 x weekly - 2 sets - 10 reps - Prone Press  Up On Elbows  - 2 x daily - 7 x weekly - 1 sets - 10 reps - 10 hold  ASSESSMENT:  CLINICAL IMPRESSION: Pt tolerating exercises well. Pt reporting his pain has moved from his hips to centralized low back. Recommending continuation of skilled PT interventions.   OBJECTIVE IMPAIRMENTS: decreased balance, difficulty walking, decreased ROM, decreased strength, impaired flexibility, and pain.   ACTIVITY LIMITATIONS: bending, squatting, and transfers  PARTICIPATION LIMITATIONS: community activity and yard work  PERSONAL FACTORS: see PMH above are also affecting patient's functional outcome.   REHAB POTENTIAL: Good  CLINICAL DECISION MAKING: Stable/uncomplicated  EVALUATION COMPLEXITY: Low   GOALS: Goals reviewed with patient? Yes  SHORT TERM GOALS: (target date for Short term goals are 3 weeks 07/16/2024)   1.  Patient will demonstrate independent use of home exercise program to maintain progress from in clinic treatments.  Goal status:MET 07/18/24  LONG TERM GOALS: (target dates for all long term goals are 10 weeks  09/03/2024 )   1. Patient will demonstrate/report pain at worst less than or equal to 2/10 to facilitate minimal limitation in daily activity secondary to pain symptoms.  Goal status: on-going  08/07/24 (most days it's met)   2. Patient will demonstrate independent use of home exercise program to facilitate ability to maintain/progress functional gains from skilled physical therapy services.  Goal status: on-going 08/07/24   3. Patient will demonstrate Patient specific functional scale avg > or = 9 to indicate reduced disability due to condition.   Goal status: Newon-going 08/07/24   4.  Patient will demonstrate Rt LE MMT >/= 5 pounds of force using HHD throughout to faciltiate usual  transfers, stairs, squatting at PLOF for daily life.   Goal status:on-going 08/07/24   5.  Patient will demonstrate amb >/= 1/2 mile on TM with upright posture with Rt hip pain of </= 2/10.  Goal status: MET 07/30/24   6.  Pt will be able to lift 15# from floor to table height using correct body mechanics with bil hip pain of </= 2/10.  Goal status: MET 07/30/24    PLAN:  PT FREQUENCY: 1x/week,   PT DURATION: 10 weeks  PLANNED INTERVENTIONS: Can include 02853- PT Re-evaluation, 97110-Therapeutic exercises, 97530- Therapeutic activity, 97112- Neuromuscular re-education, 97535- Self Care, 97140- Manual therapy, [phone]- Gait training, [id number]- Orthotic Fit/training, [phone]- Canalith repositioning, [id number]- Aquatic Therapy, [phone]- Electrical stimulation (unattended), [id number] Physical performance testing, 97016- Vasopneumatic device, [id number]- Ultrasound, [id number]- Traction (mechanical), [id number]- Ionotophoresis 4mg /ml Dexamethasone ,  79439 - Needle insertion w/o injection 1 or 2 muscles, 20561 - Needle insertion w/o injection 3 or more muscles.   Patient/Family education, Balance training, Stair training, Taping, Dry Needling, Joint mobilization, Joint manipulation, Spinal manipulation, Spinal mobilization, Scar mobilization, Vestibular training, Visual/preceptual remediation/compensation, DME instructions, Cryotherapy, and Moist heat.  All performed as medically necessary.  All included unless contraindicated  PLAN FOR NEXT SESSION: Progress core strengthening  Delon JONELLE Lunger, PT, MPT 08/07/2024, 10:42 AM

## 2024-08-14 ENCOUNTER — Encounter: Payer: Self-pay | Admitting: Physical Therapy

## 2024-08-14 ENCOUNTER — Ambulatory Visit: Admitting: Physical Therapy

## 2024-08-14 DIAGNOSIS — M25551 Pain in right hip: Secondary | ICD-10-CM | POA: Diagnosis not present

## 2024-08-14 DIAGNOSIS — M25552 Pain in left hip: Secondary | ICD-10-CM

## 2024-08-14 DIAGNOSIS — M6281 Muscle weakness (generalized): Secondary | ICD-10-CM

## 2024-08-14 NOTE — Therapy (Signed)
 OUTPATIENT PHYSICAL THERAPY LOWER EXTREMITY TREATMENT  Patient Name: Christopher Lawrence MRN: [id number] DOB:Feb 24, 1956, 68 y.o., male Today's Date: 08/14/2024  END OF SESSION:  PT End of Session - 08/14/24 1202     Visit Number 8    Number of Visits 10    Date for Recertification  09/03/24    Progress Note Due on Visit 10    PT Start Time 1150    PT Stop Time 1230    PT Time Calculation (min) 40 min    Activity Tolerance Patient tolerated treatment well    Behavior During Therapy [facility] for tasks assessed/performed                 Past Medical History:  Diagnosis Date   Allergy    GERD (gastroesophageal reflux disease)    Hx of adenomatous polyp of colon 10/27/2016   Labile hypertension    Other testicular hypofunction    Psoriasis    Past Surgical History:  Procedure Laterality Date   COLONOSCOPY  10/2016   COLONOSCOPY N/A 10/2016   HEMORRHOID BANDING  04/2011   HERNIA REPAIR Right 1958   INSERTION OF MESH N/A 08/30/2014   Procedure: INSERTION OF MESH;  Surgeon: Elspeth Schultze, MD;  Location: [facility] OR;  Service: General;  Laterality: N/A;   LAPAROSCOPIC INGUINAL HERNIA WITH UMBILICAL HERNIA N/A 08/30/2014   Procedure: LAPAROSCOPIC EXPLORATION AND REPAIR OF BILATERAL HERNIAS IN  GROINS AND UMBILICAL HERNIA REPAIR.;  Surgeon: Elspeth Schultze, MD;  Location: [facility] OR;  Service: General;  Laterality: N/A;   POLYPECTOMY     Patient Active Problem List   Diagnosis Date Noted   Right hip pain 03/10/2024   Hx of adenomatous polyp of colon 10/27/2016   Vitamin D  deficiency 07/26/2014   Gastroesophageal reflux disease    Labile hypertension    Testosterone  deficiency    Psoriasis     PCP: Chandra Toribio POUR, MD   REFERRING PROVIDER: Vernetta Lonni GRADE*    REFERRING DIAG:  F83.88 (ICD-10-CM) - Unilateral primary osteoarthritis, right hip  M16.12 (ICD-10-CM) - Unilateral primary osteoarthritis, left hip    THERAPY DIAG:  Pain in right hip  Pain in left hip  Muscle  weakness (generalized)  Rationale for Evaluation and Treatment: Rehabilitation  ONSET DATE: 6-8 months  SUBJECTIVE:   SUBJECTIVE STATEMENT: Pt arriving today stating his low back pain is less than 1. Pt reporting having another long walk this morning of 5 miles.   PERTINENT HISTORY: He does report stiffness and lateral hip pain on the right side has been going on for about 7 to 8 months. He said he is very stiff first thing in the morning. Pt enjoys walking the golf course. Pt reporting pain on lateral right hip.  Foot surgery on left   PAIN:  NPRS scale: 0.5/10 more in low back Pain description: achy Aggravating factors: sleeping Relieving factors: changing positions  PRECAUTIONS: None  WEIGHT BEARING RESTRICTIONS: No  FALLS:  Has patient fallen in last 6 months? No  LIVING ENVIRONMENT: Lives with: lives with their family and lives with their spouse Lives in: House/apartment Stairs: Yes: External: 1 steps; none split level home with rail on Rt Has following equipment at home: None  OCCUPATION: not currently working  PLOF: Independent  PATIENT GOALS: walk golf course without pain, wake up and be able to walk with upright posture and less stiffness.   Next MD visit: 07/18/24 c Dr. 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   PATIENT SURVEYS:  Patient-Specific Activity Scoring Scheme  0 represents "unable to perform." 10 represents "able to perform at prior level. 0 1 2 3 4 5 6 7 8 9  10 (Date and Score)   Activity Eval   08/14/24   1. Yard work: standing up straight after stooping 7  10   2. Getting on/off floor/ground  7 9   3.     4.    5.    Score 7 9.5   Total score = sum of the activity  scores/number of activities Minimum detectable change (90%CI) for average score = 2 points Minimum detectable change (90%CI) for single activity score = 3 points  COGNITION: Overall cognitive status: WFL    SENSATION: WFL   MUSCLE LENGTH: Positive Thomas test: bil LE's   POSTURE:  rounded shoulders and forward head     LOWER EXTREMITY ROM:   ROM Right eval Left eval  Hip flexion 102 115  Hip extension 12 18  Hip abduction    Hip adduction    Hip internal rotation    Hip external rotation    Knee flexion [facility]  Knee extension    Ankle dorsiflexion    Ankle plantarflexion    Ankle inversion    Ankle eversion     (Blank rows = not tested)  LOWER EXTREMITY MMT:  MMT Right Eval Sitting 06/25/24  Left Eval Sitting 06/25/24  Hip flexion 34.5 39.4  Hip extension    Hip abduction    Hip adduction    Hip internal rotation    Hip external rotation    Knee flexion    Knee extension    Ankle dorsiflexion    Ankle plantarflexion    Ankle inversion    Ankle eversion     (Blank rows = not tested)   GAIT: Distance walked: clinic distances Assistive device utilized: None Level of assistance: Complete Independence Comments:                                                                                                                                                                         TODAY'S TREATMENT  08/14/24 TherEx Recumbent bike: Level 4 x 5 minutes Neuro Re-Ed Airex beam: standing staggered stance x 1 minute each LE in front, walking x 6 laps all c intermittent UE support Standing cone taps across midline x 20 c UE support as needed TherAct Double Leg Press: 100# 3 x 10  Single leg press: 50# x 10  Calf presses on leg press: 25# 2 x 10    BATCA Rows: 25# 2 x 10, 35# x 10  Lat Pull downs 25# 2 x 10  Bridge Walk outs x 10      TODAY'S TREATMENT                                                                            08/07/24 TherEx Recumbent bike: Level 3 x 5 minutes Neuro Re-Ed Cat/cow: x 5 holding 10 sec Quadraped: opposite arm/leg x 10  Modified one thousand abdominal strengthening and core stability: 5 x pulses of 10  Dead bug: opposite arm/leg core activation TherAct Double Leg Press: 93# 2 x 15   Rows: blue TB 2 x 15 holding 3 sec Extension tband blue 2 x 15 holding 3 sec TRX squats 2 x 10    TODAY'S TREATMENT                                                                           07/30/24 TherEx Recumbent bike: Level 3 x 7 minutes Extension with 1# bar 2x10 Neuro Re-Ed Cat/cow: x 10 holding 5 sec Quadraped: opposite arm/leg x 10  TherAct Double Leg Press: 87# 3 x 10  Rows: blue TB 2 x 15 holding 3 sec Extension tband blue 3x10 Lifting kettle bell off floor with squat  15# 2x8 Standing trunk extension: elbows on the wall x 10 holding 5 sec Standing bil shoulder flexion c 2 # bar, ball on the wall in mini squat position 2 x 10      PATIENT EDUCATION:  Education details: HEP, POC Person educated: Patient Education method: Programmer, multimedia, Demonstration, Verbal cues, and Handouts Education comprehension: verbalized understanding, returned demonstration, and verbal cues required   HOME EXERCISE PROGRAM: Access Code: [email or web address] URL: [email or web address] Date: 07/11/2024 Prepared by: Burnard Meth  Exercises - Supine Figure 4 Piriformis Stretch  - 2 x daily - 7 x weekly - 3 reps - 15-20 seconds hold - Standing Hip Flexor Stretch  - 2 x daily - 7 x weekly - 3 reps - 15-20 seconds hold - Standing Lumbar Extension at Wall - Forearms  - 2 x daily - 7 x weekly - 10 reps - 10 seconds hold - Standing Hip Hiking  - 2 x daily - 7 x weekly - 2 sets - 10 reps - Seated Piriformis Stretch  - 2 x daily - 7 x weekly - 1 sets - 3 reps - 25 hold - Supine Single Knee to Chest Stretch  - 2 x daily - 7 x weekly - 1  sets - 3 reps - 25  hold - Lower Trunk Rotations  - 2 x daily - 7 x weekly - 2 sets - 10 reps - 2 hold - Prone Alternating Arm and Leg Lifts  - 1 x daily - 7 x weekly - 3 sets - 10 reps - Supine Piriformis Stretch with Foot on Ground  - 1 x daily - 7 x weekly - 3 sets - 10 reps - Prone Alternating Arm and Leg Lifts  - 2 x daily - 7 x weekly - 2 sets - 10 reps - Prone Press Up On Elbows  - 2 x daily - 7 x weekly - 1 sets - 10 reps - 10 hold  ASSESSMENT:  CLINICAL IMPRESSION: Pt arriving today reporting back pain of less than 1. Today's treatment focusing on core activation during exercises. Recommending continuation of skilled PT services to meet goals not met..   OBJECTIVE IMPAIRMENTS: decreased balance, difficulty walking, decreased ROM, decreased strength, impaired flexibility, and pain.   ACTIVITY LIMITATIONS: bending, squatting, and transfers  PARTICIPATION LIMITATIONS: community activity and yard work  PERSONAL FACTORS: see PMH above are also affecting patient's functional outcome.   REHAB POTENTIAL: Good  CLINICAL DECISION MAKING: Stable/uncomplicated  EVALUATION COMPLEXITY: Low   GOALS: Goals reviewed with patient? Yes  SHORT TERM GOALS: (target date for Short term goals are 3 weeks 07/16/2024)   1.  Patient will demonstrate independent use of home exercise program to maintain progress from in clinic treatments.  Goal status:MET 07/18/24  LONG TERM GOALS: (target dates for all long term goals are 10 weeks  09/03/2024 )   1. Patient will demonstrate/report pain at worst less than or equal to 2/10 to facilitate minimal limitation in daily activity secondary to pain symptoms.  Goal status: on-going  08/07/24 (most days it's met)   2. Patient will demonstrate independent use of home exercise program to facilitate ability to maintain/progress functional gains from skilled physical therapy services.  Goal status: on-going 08/07/24   3. Patient will demonstrate Patient specific functional scale  avg > or = 9 to indicate reduced disability due to condition.   Goal status: Met 08/14/24   4.  Patient will demonstrate Rt LE MMT >/= 5 pounds of force using HHD throughout to faciltiate usual transfers, stairs, squatting at PLOF for daily life.   Goal status:on-going 08/07/24   5.  Patient will demonstrate amb >/= 1/2 mile on TM with upright posture with Rt hip pain of </= 2/10.  Goal status: MET 07/30/24   6.  Pt will be able to lift 15# from floor to table height using correct body mechanics with bil hip pain of </= 2/10.  Goal status: MET 07/30/24    PLAN:  PT FREQUENCY: 1x/week,   PT DURATION: 10 weeks  PLANNED INTERVENTIONS: Can include 02853- PT Re-evaluation, 97110-Therapeutic exercises, 97530- Therapeutic activity, 97112- Neuromuscular re-education, 97535- Self Care, 97140- Manual therapy, [phone]- Gait training, [phone]- Orthotic Fit/training, [phone]- Canalith repositioning, [id number]- Aquatic Therapy, [phone]- Electrical stimulation (unattended), [id number] Physical performance testing, 97016- Vasopneumatic device, [id number]- Ultrasound, [id number]- Traction (mechanical), [id number]- Ionotophoresis 4mg /ml Dexamethasone ,  79439 - Needle insertion w/o injection 1 or 2 muscles, 20561 - Needle insertion w/o injection 3 or more muscles.   Patient/Family education, Balance training, Stair training, Taping, Dry Needling, Joint mobilization, Joint manipulation, Spinal manipulation, Spinal mobilization, Scar mobilization, Vestibular training, Visual/preceptual remediation/compensation, DME instructions, Cryotherapy, and Moist heat.  All performed as medically necessary.  All included unless contraindicated  PLAN FOR NEXT SESSION:  Progress core strengthening, dynamic balance  Delon JONELLE Lunger, PT, MPT 08/14/2024, 12:26 PM

## 2024-08-20 ENCOUNTER — Other Ambulatory Visit: Payer: Self-pay | Admitting: *Deleted

## 2024-08-20 DIAGNOSIS — Z125 Encounter for screening for malignant neoplasm of prostate: Secondary | ICD-10-CM

## 2024-08-20 DIAGNOSIS — E559 Vitamin D deficiency, unspecified: Secondary | ICD-10-CM

## 2024-08-20 DIAGNOSIS — R0989 Other specified symptoms and signs involving the circulatory and respiratory systems: Secondary | ICD-10-CM

## 2024-08-20 DIAGNOSIS — E663 Overweight: Secondary | ICD-10-CM

## 2024-08-21 ENCOUNTER — Ambulatory Visit: Admitting: Physical Therapy

## 2024-08-21 ENCOUNTER — Encounter: Payer: Self-pay | Admitting: Physical Therapy

## 2024-08-21 DIAGNOSIS — M25551 Pain in right hip: Secondary | ICD-10-CM | POA: Diagnosis not present

## 2024-08-21 DIAGNOSIS — M6281 Muscle weakness (generalized): Secondary | ICD-10-CM

## 2024-08-21 DIAGNOSIS — M25552 Pain in left hip: Secondary | ICD-10-CM | POA: Diagnosis not present

## 2024-08-21 NOTE — Therapy (Signed)
 OUTPATIENT PHYSICAL THERAPY LOWER EXTREMITY TREATMENT  Patient Name: Christopher Lawrence MRN: [id number] DOB:01/03/1956, 68 y.o., male Today's Date: 08/21/2024  END OF SESSION:  PT End of Session - 08/21/24 1156     Visit Number 9    Number of Visits 10    Date for Recertification  09/03/24    Progress Note Due on Visit 10    PT Start Time 1152    PT Stop Time 1230    PT Time Calculation (min) 38 min    Activity Tolerance Patient tolerated treatment well    Behavior During Therapy [facility] for tasks assessed/performed              Past Medical History:  Diagnosis Date   Allergy    GERD (gastroesophageal reflux disease)    Hx of adenomatous polyp of colon 10/27/2016   Labile hypertension    Other testicular hypofunction    Psoriasis    Past Surgical History:  Procedure Laterality Date   COLONOSCOPY  10/2016   COLONOSCOPY N/A 10/2016   HEMORRHOID BANDING  04/2011   HERNIA REPAIR Right 1958   INSERTION OF MESH N/A 08/30/2014   Procedure: INSERTION OF MESH;  Surgeon: Elspeth Schultze, MD;  Location: [facility] OR;  Service: General;  Laterality: N/A;   LAPAROSCOPIC INGUINAL HERNIA WITH UMBILICAL HERNIA N/A 08/30/2014   Procedure: LAPAROSCOPIC EXPLORATION AND REPAIR OF BILATERAL HERNIAS IN  GROINS AND UMBILICAL HERNIA REPAIR.;  Surgeon: Elspeth Schultze, MD;  Location: [facility] OR;  Service: General;  Laterality: N/A;   POLYPECTOMY     Patient Active Problem List   Diagnosis Date Noted   Right hip pain 03/10/2024   Hx of adenomatous polyp of colon 10/27/2016   Vitamin D  deficiency 07/26/2014   Gastroesophageal reflux disease    Labile hypertension    Testosterone  deficiency    Psoriasis     PCP: Chandra Toribio POUR, MD   REFERRING PROVIDER: Vernetta Lonni GRADE*    REFERRING DIAG:  F83.88 (ICD-10-CM) - Unilateral primary osteoarthritis, right hip  M16.12 (ICD-10-CM) - Unilateral primary osteoarthritis, left hip    THERAPY DIAG:  Pain in right hip  Pain in left hip  Muscle weakness  (generalized)  Rationale for Evaluation and Treatment: Rehabilitation  ONSET DATE: 6-8 months  SUBJECTIVE:   SUBJECTIVE STATEMENT: Pt arriving reporting no pain. Pt stating he is still looking into joining a gym and asked about [employer].   PERTINENT HISTORY: He does report stiffness and lateral hip pain on the right side has been going on for about 7 to 8 months. He said he is very stiff first thing in the morning. Pt enjoys walking the golf course. Pt reporting pain on lateral right hip.  Foot surgery on left   PAIN:  NPRS scale: no pain reported. Pain description: achy Aggravating factors: sleeping Relieving factors: changing positions  PRECAUTIONS: None  WEIGHT BEARING RESTRICTIONS: No  FALLS:  Has patient fallen in last 6 months? No  LIVING ENVIRONMENT: Lives with: lives with their family and lives with their spouse Lives in: House/apartment Stairs: Yes: External: 1 steps; none split level home with rail on Rt Has following equipment at home: None  OCCUPATION: not currently working  PLOF: Independent  PATIENT GOALS: walk golf course without pain, wake up and be able to walk with upright posture and less stiffness.   Next MD visit: 07/18/24 c Dr. 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   PATIENT SURVEYS:  Patient-Specific Activity Scoring Scheme  0 represents "unable to perform." 10 represents "able to perform at prior level. 0 1 2 3 4 5 6 7 8 9  10 (Date and Score)   Activity Eval   08/14/24   1. Yard work: standing up straight after stooping 7  10   2. Getting on/off floor/ground  7 9   3.     4.    5.    Score 7 9.5   Total score = sum of the activity scores/number of  activities Minimum detectable change (90%CI) for average score = 2 points Minimum detectable change (90%CI) for single activity score = 3 points  COGNITION: Overall cognitive status: WFL    SENSATION: WFL   MUSCLE LENGTH: Positive Thomas test: bil LE's   POSTURE:  rounded shoulders and forward head     LOWER EXTREMITY ROM:   ROM Right eval Left eval  Hip flexion 102 115  Hip extension 12 18  Hip abduction    Hip adduction    Hip internal rotation    Hip external rotation    Knee flexion [facility]  Knee extension    Ankle dorsiflexion    Ankle plantarflexion    Ankle inversion    Ankle eversion     (Blank rows = not tested)  LOWER EXTREMITY MMT:  MMT Right Eval Sitting 06/25/24  Left Eval Sitting 06/25/24  Hip flexion 34.5 39.4  Hip extension    Hip abduction    Hip adduction    Hip internal rotation    Hip external rotation    Knee flexion    Knee extension    Ankle dorsiflexion    Ankle plantarflexion    Ankle inversion    Ankle eversion     (Blank rows = not tested)   GAIT: Distance walked: clinic distances Assistive device utilized: None Level of assistance: Complete Independence Comments:                                                                                                                                                                         TODAY'S TREATMENT  08/21/24 TherAct scifit bike: Level 5 x 5 minutes (pushing, pulling, ROM and strength) Double Leg Press: 106# 2 x 15  Single leg press: 56# x 15 bil   Calf presses on leg press: 31# 2 x 10    BATCA Rows: 35# 2 x 10  Lat Pull downs 25# 2 x 10  [employer] chops 5 # x 10 bil  [employer] pulls 5 # x 10  Stepping over 6 inch hurdles (4 hurdles x 4) Lateral stepping over 6 inch hurdles ( 4 hurdles x 6)     TODAY'S TREATMENT                                                                            08/14/24 TherEx Recumbent bike: Level 4 x 5 minutes Neuro Re-Ed Airex beam: standing staggered stance x 1 minute each LE in front, walking x 6 laps all c intermittent UE support Standing cone taps across midline x 20 c UE support as needed TherAct Double Leg Press: 100# 3 x 10  Single leg press: 50# x 10  Calf presses on leg press: 25# 2 x 10    BATCA Rows: 25# 2 x 10, 35# x 10  Lat Pull downs 25# 2 x 10  Bridge Walk outs x 10      TODAY'S TREATMENT                                                                           08/07/24 TherEx Recumbent bike: Level 3 x 5 minutes Neuro Re-Ed Cat/cow: x 5 holding 10 sec Quadraped: opposite arm/leg x 10  Modified one thousand abdominal strengthening and core stability: 5 x pulses of 10  Dead bug: opposite arm/leg core activation TherAct Double Leg Press: 93# 2 x 15   Rows: blue TB 2 x 15 holding 3 sec Extension tband blue 2 x 15 holding 3 sec TRX squats 2 x 10        PATIENT EDUCATION:  Education details: HEP, POC Person educated: Patient Education method: Programmer, multimedia, Demonstration, Verbal cues, and Handouts Education comprehension: verbalized understanding, returned demonstration, and verbal cues required   HOME EXERCISE PROGRAM: Access Code: [email or web address] URL: [email or web address] Date: 07/11/2024 Prepared by: Burnard Meth  Exercises - Supine Figure 4 Piriformis Stretch  - 2 x daily - 7 x weekly - 3 reps - 15-20 seconds hold - Standing Hip Flexor Stretch  - 2 x daily - 7 x weekly - 3 reps - 15-20 seconds hold - Standing Lumbar Extension at Wall - Forearms  - 2 x daily - 7 x weekly - 10 reps - 10 seconds hold - Standing Hip Hiking  - 2 x daily - 7 x weekly - 2 sets - 10 reps - Seated Piriformis Stretch  - 2 x daily - 7 x weekly - 1 sets - 3 reps - 25 hold - Supine Single Knee to Chest Stretch  - 2  x daily - 7 x weekly - 1 sets - 3 reps - 25 hold - Lower Trunk Rotations  - 2 x daily - 7 x weekly - 2  sets - 10 reps - 2 hold - Prone Alternating Arm and Leg Lifts  - 1 x daily - 7 x weekly - 3 sets - 10 reps - Supine Piriformis Stretch with Foot on Ground  - 1 x daily - 7 x weekly - 3 sets - 10 reps - Prone Alternating Arm and Leg Lifts  - 2 x daily - 7 x weekly - 2 sets - 10 reps - Prone Press Up On Elbows  - 2 x daily - 7 x weekly - 1 sets - 10 reps - 10 hold  ASSESSMENT:  CLINICAL IMPRESSION: Pt reporting no pain upon arrival. Pt tolerating all exercises well focusing on functional mobility and dynamic balance. We discussed discharge at his next visit.   OBJECTIVE IMPAIRMENTS: decreased balance, difficulty walking, decreased ROM, decreased strength, impaired flexibility, and pain.   ACTIVITY LIMITATIONS: bending, squatting, and transfers  PARTICIPATION LIMITATIONS: community activity and yard work  PERSONAL FACTORS: see PMH above are also affecting patient's functional outcome.   REHAB POTENTIAL: Good  CLINICAL DECISION MAKING: Stable/uncomplicated  EVALUATION COMPLEXITY: Low   GOALS: Goals reviewed with patient? Yes  SHORT TERM GOALS: (target date for Short term goals are 3 weeks 07/16/2024)   1.  Patient will demonstrate independent use of home exercise program to maintain progress from in clinic treatments.  Goal status:MET 07/18/24  LONG TERM GOALS: (target dates for all long term goals are 10 weeks  09/03/2024 )   1. Patient will demonstrate/report pain at worst less than or equal to 2/10 to facilitate minimal limitation in daily activity secondary to pain symptoms.  Goal status: Met 08/21/24   2. Patient will demonstrate independent use of home exercise program to facilitate ability to maintain/progress functional gains from skilled physical therapy services.  Goal status: on-going 08/07/24   3. Patient will demonstrate Patient specific functional scale avg > or = 9 to indicate reduced disability due to condition.   Goal status: Met 08/14/24   4.  Patient will  demonstrate Rt LE MMT >/= 5 pounds of force using HHD throughout to faciltiate usual transfers, stairs, squatting at PLOF for daily life.   Goal status:on-going 08/07/24   5.  Patient will demonstrate amb >/= 1/2 mile on TM with upright posture with Rt hip pain of </= 2/10.  Goal status: MET 07/30/24   6.  Pt will be able to lift 15# from floor to table height using correct body mechanics with bil hip pain of </= 2/10.  Goal status: MET 07/30/24    PLAN:  PT FREQUENCY: 1x/week,   PT DURATION: 10 weeks  PLANNED INTERVENTIONS: Can include 02853- PT Re-evaluation, 97110-Therapeutic exercises, 97530- Therapeutic activity, 97112- Neuromuscular re-education, 97535- Self Care, 97140- Manual therapy, [phone]- Gait training, [phone]- Orthotic Fit/training, [phone]- Canalith repositioning, [id number]- Aquatic Therapy, [phone]- Electrical stimulation (unattended), [id number] Physical performance testing, 97016- Vasopneumatic device, [id number]- Ultrasound, [id number]- Traction (mechanical), [id number]- Ionotophoresis 4mg /ml Dexamethasone ,  79439 - Needle insertion w/o injection 1 or 2 muscles, 20561 - Needle insertion w/o injection 3 or more muscles.   Patient/Family education, Balance training, Stair training, Taping, Dry Needling, Joint mobilization, Joint manipulation, Spinal manipulation, Spinal mobilization, Scar mobilization, Vestibular training, Visual/preceptual remediation/compensation, DME instructions, Cryotherapy, and Moist heat.  All performed as medically necessary.  All included unless contraindicated  PLAN FOR NEXT SESSION: Discharge  next visit    Delon JONELLE Lunger, PT, MPT 08/21/2024, 12:32 PM

## 2024-08-28 ENCOUNTER — Encounter: Payer: Self-pay | Admitting: Physical Therapy

## 2024-08-28 ENCOUNTER — Other Ambulatory Visit

## 2024-08-28 ENCOUNTER — Ambulatory Visit (INDEPENDENT_AMBULATORY_CARE_PROVIDER_SITE_OTHER): Admitting: Physical Therapy

## 2024-08-28 DIAGNOSIS — M25552 Pain in left hip: Secondary | ICD-10-CM | POA: Diagnosis not present

## 2024-08-28 DIAGNOSIS — M25551 Pain in right hip: Secondary | ICD-10-CM | POA: Diagnosis not present

## 2024-08-28 DIAGNOSIS — Z125 Encounter for screening for malignant neoplasm of prostate: Secondary | ICD-10-CM

## 2024-08-28 DIAGNOSIS — E559 Vitamin D deficiency, unspecified: Secondary | ICD-10-CM

## 2024-08-28 DIAGNOSIS — R0989 Other specified symptoms and signs involving the circulatory and respiratory systems: Secondary | ICD-10-CM | POA: Diagnosis not present

## 2024-08-28 DIAGNOSIS — M6281 Muscle weakness (generalized): Secondary | ICD-10-CM

## 2024-08-28 DIAGNOSIS — E663 Overweight: Secondary | ICD-10-CM

## 2024-08-28 NOTE — Therapy (Signed)
 OUTPATIENT PHYSICAL THERAPY LOWER EXTREMITY TREATMENT Discharge   Patient Name: Christopher Lawrence MRN: [id number] DOB:[birth year], 68 y.o., male Today's Date: 08/28/2024  END OF SESSION:  PT End of Session - 08/28/24 1215     Visit Number 10    Number of Visits 10    Date for Recertification  09/03/24    PT Start Time 1148    PT Stop Time 1230    PT Time Calculation (min) 42 min    Activity Tolerance Patient tolerated treatment well    Behavior During Therapy [facility] for tasks assessed/performed               Past Medical History:  Diagnosis Date   Allergy    GERD (gastroesophageal reflux disease)    Hx of adenomatous polyp of colon 10/27/2016   Labile hypertension    Other testicular hypofunction    Psoriasis    Past Surgical History:  Procedure Laterality Date   COLONOSCOPY  10/2016   COLONOSCOPY N/A 10/2016   HEMORRHOID BANDING  04/2011   HERNIA REPAIR Right 1958   INSERTION OF MESH N/A 08/30/2014   Procedure: INSERTION OF MESH;  Surgeon: Elspeth Schultze, MD;  Location: [facility] OR;  Service: General;  Laterality: N/A;   LAPAROSCOPIC INGUINAL HERNIA WITH UMBILICAL HERNIA N/A 08/30/2014   Procedure: LAPAROSCOPIC EXPLORATION AND REPAIR OF BILATERAL HERNIAS IN  GROINS AND UMBILICAL HERNIA REPAIR.;  Surgeon: Elspeth Schultze, MD;  Location: [facility] OR;  Service: General;  Laterality: N/A;   POLYPECTOMY     Patient Active Problem List   Diagnosis Date Noted   Right hip pain 03/10/2024   Hx of adenomatous polyp of colon 10/27/2016   Vitamin D  deficiency 07/26/2014   Gastroesophageal reflux disease    Labile hypertension    Testosterone  deficiency    Psoriasis     PCP: Chandra Toribio POUR, MD   REFERRING PROVIDER: Vernetta Lonni GRADE*    REFERRING DIAG:  F83.88 (ICD-10-CM) - Unilateral primary osteoarthritis, right hip  M16.12 (ICD-10-CM) - Unilateral primary osteoarthritis, left hip    THERAPY DIAG:  Pain in right hip  Pain in left hip  Muscle weakness  (generalized)  Rationale for Evaluation and Treatment: Rehabilitation  ONSET DATE: 6-8 months  SUBJECTIVE:   SUBJECTIVE STATEMENT: Pt reporting no pain. Pt ready for discharge.   PERTINENT HISTORY: He does report stiffness and lateral hip pain on the right side has been going on for about 7 to 8 months. He said he is very stiff first thing in the morning. Pt enjoys walking the golf course. Pt reporting pain on lateral right hip.  Foot surgery on left   PAIN:  NPRS scale: no pain  Pain description: achy Aggravating factors: sleeping Relieving factors: changing positions  PRECAUTIONS: None  WEIGHT BEARING RESTRICTIONS: No  FALLS:  Has patient fallen in last 6 months? No  LIVING ENVIRONMENT: Lives with: lives with their family and lives with their spouse Lives in: House/apartment Stairs: Yes: External: 1 steps; none split level home with rail on Rt Has following equipment at home: None  OCCUPATION: not currently working  PLOF: Independent  PATIENT GOALS: walk golf course without pain, wake up and be able to walk with upright posture and less stiffness.   Next MD visit: 07/18/24 c Dr. 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   PATIENT SURVEYS:  Patient-Specific Activity Scoring Scheme  0 represents "unable to perform." 10 represents "able to perform at prior level. 0 1 2 3 4 5 6 7 8 9  10 (Date and Score)   Activity Eval   08/14/24  08/28/24  1. Yard work: standing up straight after stooping 7  10  10   2. Getting on/off floor/ground  7 9  9   3.      4.     5.     Score 7 9.5 9/5   Total score = sum of the activity scores/number of activities Minimum detectable change (90%CI) for average score = 2  points Minimum detectable change (90%CI) for single activity score = 3 points  COGNITION: Overall cognitive status: WFL    SENSATION: WFL   MUSCLE LENGTH: Positive Thomas test: bil LE's   POSTURE:  rounded shoulders and forward head     LOWER EXTREMITY ROM:   ROM Right eval Left eval Rt / Left 08/28/24  Hip flexion 102 115 116 / 122  Hip extension 12 18 20  / 22  Hip abduction     Hip adduction     Hip internal rotation     Hip external rotation     Knee flexion [facility]   Knee extension     Ankle dorsiflexion     Ankle plantarflexion     Ankle inversion     Ankle eversion      (Blank rows = not tested)  LOWER EXTREMITY MMT:  MMT Right Eval Sitting 06/25/24  Left Eval Sitting 06/25/24 Rt  / Left 08/28/24 HHD in pounds  Hip flexion 34.5 39.4 Sitting 35.6 / 39.5 supine 40.0 / 40/4  Hip extension     Hip abduction     Hip adduction     Hip internal rotation     Hip external rotation     Knee flexion     Knee extension     Ankle dorsiflexion     Ankle plantarflexion     Ankle inversion     Ankle eversion      (Blank rows = not tested)   GAIT: Distance walked: clinic distances Assistive device utilized: None Level of assistance: Complete Independence Comments:                                                                                                                                                                        TODAY'S TREATMENT  08/21/24 TherEx: Updated HEP c trial of each exercises/ MMT/ROM   TODAY'S TREATMENT                                                                           08/21/24 TherAct scifit bike: Level 5 x 5 minutes (pushing, pulling, ROM and strength) Double Leg Press: 106# 2 x 15  Single leg press: 56# x 15 bil   Calf presses on leg press: 31# 2 x 10    BATCA Rows: 35# 2 x 10  Lat Pull downs 25# 2 x 10  Cable Wood chops 5 # x 10  bil  [employer] pulls 5 # x 10  Stepping over 6 inch hurdles (4 hurdles x 4) Lateral stepping over 6 inch hurdles ( 4 hurdles x 6)     TODAY'S TREATMENT                                                                           08/14/24 TherEx Recumbent bike: Level 4 x 5 minutes Neuro Re-Ed Airex beam: standing staggered stance x 1 minute each LE in front, walking x 6 laps all c intermittent UE support Standing cone taps across midline x 20 c UE support as needed TherAct Double Leg Press: 100# 3 x 10  Single leg press: 50# x 10  Calf presses on leg press: 25# 2 x 10    BATCA Rows: 25# 2 x 10, 35# x 10  Lat Pull downs 25# 2 x 10  Bridge Walk outs x 10      TODAY'S TREATMENT                                                                           08/07/24 TherEx Recumbent bike: Level 3 x 5 minutes Neuro Re-Ed Cat/cow: x 5 holding 10 sec Quadraped: opposite arm/leg x 10  Modified one thousand abdominal strengthening and core stability: 5 x pulses of 10  Dead bug: opposite arm/leg core activation TherAct Double Leg Press: 93# 2 x 15   Rows: blue TB 2 x 15 holding 3 sec Extension tband blue 2 x 15 holding 3 sec TRX squats 2 x 10     PATIENT EDUCATION:  Education details: HEP, POC Person educated: Patient Education method: Programmer, Multimedia, Demonstration, Verbal cues, and Handouts Education comprehension: verbalized understanding, returned demonstration, and verbal cues required   HOME EXERCISE PROGRAM: Access Code: [email or web address] URL: [email or web address] Date: 08/28/2024 Prepared by: Delon Lunger  Exercises - Supine Figure 4 Piriformis Stretch  - 7 x weekly - 3 reps - 15-20 seconds hold - Seated Piriformis Stretch  - 7 x weekly - 1 sets - 3 reps -  25 hold - Standing Hip Flexor Stretch  - 7 x weekly - 3 reps - 15-20 seconds hold - Standing Lumbar Extension at Wall - Forearms  - 7 x weekly - 10 reps - 10 seconds hold - Standing Hip Hiking  - 7 x  weekly - 2 sets - 10 reps - Supine Single Knee to Chest Stretch  - 7 x weekly - 1 sets - 3 reps - 25 hold - Lower Trunk Rotations  - 7 x weekly - 2 sets - 10 reps - 20 seconds hold - Supine Dead Bug with Leg Extension  - 7 x weekly - 2 sets - 10 reps - Supine Dead Bug with Leg Extension  - 7 x weekly - 2 sets - 10 reps - Supine Piriformis Stretch with Foot on Ground  - 7 x weekly - 3 reps - 20 seconds hold - Bird Dog  - 7 x weekly - 2 sets - 10 reps - 3-5 seconds hold - Cat Cow  - 7 x weekly - 10 reps - Single Leg Knee Extension with Weight Machine  - 1 x daily - 7 x weekly - 3 sets - 10 reps - Hamstring Curl with Weight Machine  - 1 x daily - 7 x weekly - 3 sets - 10 reps - Hip Abduction Machine  - 1 x daily - 7 x weekly - 3 sets - 10 reps - Hip Adduction Machine  - 1 x daily - 7 x weekly - 3 sets - 10 reps - Seated Row Cable Machine  - 1 x daily - 7 x weekly - 3 sets - 10 reps  ASSESSMENT:  CLINICAL IMPRESSION: Pt reporting no pain upon arrival and has met all his STG's and LTG's set at his initial evaluation. Pt is being discharged with updated HEP and gym work out.   OBJECTIVE IMPAIRMENTS: decreased balance, difficulty walking, decreased ROM, decreased strength, impaired flexibility, and pain.   ACTIVITY LIMITATIONS: bending, squatting, and transfers  PARTICIPATION LIMITATIONS: community activity and yard work  PERSONAL FACTORS: see PMH above are also affecting patient's functional outcome.   REHAB POTENTIAL: Good  CLINICAL DECISION MAKING: Stable/uncomplicated  EVALUATION COMPLEXITY: Low   GOALS: Goals reviewed with patient? Yes  SHORT TERM GOALS: (target date for Short term goals are 3 weeks 07/16/2024)   1.  Patient will demonstrate independent use of home exercise program to maintain progress from in clinic treatments.  Goal status:MET 07/18/24  LONG TERM GOALS: (target dates for all long term goals are 10 weeks  09/03/2024 )   1. Patient will demonstrate/report pain  at worst less than or equal to 2/10 to facilitate minimal limitation in daily activity secondary to pain symptoms.  Goal status: Met 08/21/24   2. Patient will demonstrate independent use of home exercise program to facilitate ability to maintain/progress functional gains from skilled physical therapy services.  Goal status:MET 08/28/24   3. Patient will demonstrate Patient specific functional scale avg > or = 9 to indicate reduced disability due to condition.   Goal status: Met 08/14/24   4.  Patient will demonstrate Rt LE MMT >/= 5 pounds of force using HHD throughout to faciltiate usual transfers, stairs, squatting at PLOF for daily life.   Goal status:MET 08/28/24   5.  Patient will demonstrate amb >/= 1/2 mile on TM with upright posture with Rt hip pain of </= 2/10.  Goal status: MET 07/30/24   6.  Pt will be able to lift 15# from  floor to table height using correct body mechanics with bil hip pain of </= 2/10.  Goal status: MET 07/30/24    PLAN:  PT FREQUENCY: 1x/week,   PT DURATION: 10 weeks  PLANNED INTERVENTIONS: Can include 02853- PT Re-evaluation, 97110-Therapeutic exercises, 97530- Therapeutic activity, 97112- Neuromuscular re-education, 97535- Self Care, 97140- Manual therapy, [phone]- Gait training, [phone]- Orthotic Fit/training, [phone]- Canalith repositioning, [id number]- Aquatic Therapy, [phone]- Electrical stimulation (unattended), [id number] Physical performance testing, 97016- Vasopneumatic device, [id number]- Ultrasound, [id number]- Traction (mechanical), [id number]- Ionotophoresis 4mg /ml Dexamethasone ,  79439 - Needle insertion w/o injection 1 or 2 muscles, 20561 - Needle insertion w/o injection 3 or more muscles.   Patient/Family education, Balance training, Stair training, Taping, Dry Needling, Joint mobilization, Joint manipulation, Spinal manipulation, Spinal mobilization, Scar mobilization, Vestibular training, Visual/preceptual remediation/compensation, DME instructions, Cryotherapy, and  Moist heat.  All performed as medically necessary.  All included unless contraindicated  PHYSICAL THERAPY DISCHARGE SUMMARY  Visits from Start of Care: 10  Current functional level related to goals / functional outcomes: See above   Remaining deficits: See above   Education / Equipment: HEP   Patient agrees to discharge. Patient goals were met. Patient is being discharged due to meeting the stated rehab goals.     Delon JONELLE Lunger, PT, MPT 08/28/2024, 12:16 PM

## 2024-08-29 ENCOUNTER — Ambulatory Visit: Payer: Self-pay | Admitting: Family Medicine

## 2024-08-29 LAB — CBC WITH DIFFERENTIAL/PLATELET
Basophils Absolute: 0.1 x10E3/uL (ref 0.0–0.2)
Basos: 1 %
EOS (ABSOLUTE): 0.4 x10E3/uL (ref 0.0–0.4)
Eos: 7 %
Hematocrit: 45 % (ref 37.5–51.0)
Hemoglobin: 15.1 g/dL (ref 13.0–17.7)
Immature Grans (Abs): 0 x10E3/uL (ref 0.0–0.1)
Immature Granulocytes: 0 %
Lymphocytes Absolute: 1.9 x10E3/uL (ref 0.7–3.1)
Lymphs: 36 %
MCH: 30.3 pg (ref 26.6–33.0)
MCHC: 33.6 g/dL (ref 31.5–35.7)
MCV: 90 fL (ref 79–97)
Monocytes Absolute: 0.5 x10E3/uL (ref 0.1–0.9)
Monocytes: 10 %
Neutrophils Absolute: 2.4 x10E3/uL (ref 1.4–7.0)
Neutrophils: 46 %
Platelets: 205 x10E3/uL (ref 150–450)
RBC: 4.98 x10E6/uL (ref 4.14–5.80)
RDW: 13.6 % (ref 11.6–15.4)
WBC: 5.2 x10E3/uL (ref 3.4–10.8)

## 2024-08-29 LAB — COMPREHENSIVE METABOLIC PANEL WITH GFR
ALT: 43 IU/L (ref 0–44)
AST: 26 IU/L (ref 0–40)
Albumin: 4.4 g/dL (ref 3.9–4.9)
Alkaline Phosphatase: 127 IU/L — ABNORMAL HIGH (ref 47–123)
BUN/Creatinine Ratio: 22 (ref 10–24)
BUN: 17 mg/dL (ref 8–27)
Bilirubin Total: 0.4 mg/dL (ref 0.0–1.2)
CO2: 22 mmol/L (ref 20–29)
Calcium: 9.2 mg/dL (ref 8.6–10.2)
Chloride: 107 mmol/L — ABNORMAL HIGH (ref 96–106)
Creatinine, Ser: 0.79 mg/dL (ref 0.76–1.27)
Globulin, Total: 1.9 g/dL (ref 1.5–4.5)
Glucose: 102 mg/dL — ABNORMAL HIGH (ref 70–99)
Potassium: 4.4 mmol/L (ref 3.5–5.2)
Sodium: 143 mmol/L (ref 134–144)
Total Protein: 6.3 g/dL (ref 6.0–8.5)
eGFR: 97 mL/min/1.73 (ref >59)

## 2024-08-29 LAB — HEMOGLOBIN A1C
Est. average glucose Bld gHb Est-mCnc: 108 mg/dL
Hgb A1c MFr Bld: 5.4 % (ref 4.8–5.6)

## 2024-08-29 LAB — PSA: Prostate Specific Ag, Serum: 3.1 ng/mL (ref 0.0–4.0)

## 2024-08-29 LAB — LIPID PANEL
Chol/HDL Ratio: 3 ratio (ref 0.0–5.0)
Cholesterol, Total: 158 mg/dL (ref 100–199)
HDL: 53 mg/dL (ref >39)
LDL Chol Calc (NIH): 91 mg/dL (ref 0–99)
Triglycerides: 73 mg/dL (ref 0–149)
VLDL Cholesterol Cal: 14 mg/dL (ref 5–40)

## 2024-09-03 ENCOUNTER — Encounter: Payer: Self-pay | Admitting: Radiology

## 2024-09-04 ENCOUNTER — Encounter: Admitting: Family Medicine

## 2024-09-04 ENCOUNTER — Encounter

## 2024-09-10 ENCOUNTER — Other Ambulatory Visit: Payer: Self-pay | Admitting: Family Medicine

## 2024-09-10 ENCOUNTER — Encounter: Payer: Self-pay | Admitting: Family Medicine

## 2024-09-10 ENCOUNTER — Ambulatory Visit: Admitting: Family Medicine

## 2024-09-10 VITALS — BP 115/56 | HR 62 | Ht 73.23 in | Wt 212.1 lb

## 2024-09-10 DIAGNOSIS — Z0000 Encounter for general adult medical examination without abnormal findings: Secondary | ICD-10-CM | POA: Insufficient documentation

## 2024-09-10 DIAGNOSIS — R0989 Other specified symptoms and signs involving the circulatory and respiratory systems: Secondary | ICD-10-CM | POA: Diagnosis not present

## 2024-09-10 DIAGNOSIS — E663 Overweight: Secondary | ICD-10-CM

## 2024-09-10 DIAGNOSIS — L409 Psoriasis, unspecified: Secondary | ICD-10-CM

## 2024-09-10 DIAGNOSIS — Z125 Encounter for screening for malignant neoplasm of prostate: Secondary | ICD-10-CM

## 2024-09-10 DIAGNOSIS — Z23 Encounter for immunization: Secondary | ICD-10-CM

## 2024-09-10 DIAGNOSIS — E559 Vitamin D deficiency, unspecified: Secondary | ICD-10-CM

## 2024-09-10 MED ORDER — CLOBETASOL PROPIONATE 0.05 % EX OINT: TOPICAL_OINTMENT | CUTANEOUS | 3 refills | Status: DC

## 2024-09-10 NOTE — Progress Notes (Signed)
   Annual physical  Subjective   Patient ID: Christopher Lawrence, male    DOB: [birth year]  Age: 68 y.o. MRN: [id number]  Chief Complaint  Patient presents with   Annual Exam   HPI Christopher Lawrence is a 68 y.o. old male here  for annual exam.   Subjective - Follow-up visit. Reports no new major concerns since last visit. - Completed physical therapy as recommended by orthopedics for prior pain issues. Now seeking a gym to maintain mobility. - Reports new muscle pains after increasing exercise. Recently had shoulder pain which has since subsided. - Reports decreased interest in sweets and is more conscious of sugar content in foods.  - Reports needing an antacid after one cup of coffee, has reduced overall coffee intake. - Discussed recent lab results.  Medications Continues calcium, vitamin D , Allegra , magnesium, multivitamin, and zinc. Uses store-brand fluticasone  seasonally for spring allergies. Requests a refill for clobetasol  ointment.  PMH, PSH, FH, Social Hx PMHx: History of elevated PSA. Colonoscopy in December 2020. Social Hx: Former smoker. Reports regular wine consumption.  ROS MSK: Reports intermittent muscle pains with increased exercise. Denies current shoulder pain. GI: Reports indigestion after coffee. Reports decreased appetite for sweet foods. Constitutional: Denies chest pain or palpitations.   The 10-year ASCVD risk score (Arnett DK, et al., 2019) is: 11.3%  Health Maintenance Due  Topic Date Due   COVID-19 Vaccine (8 - 2025-26 season) 07/02/2024   Colonoscopy  10/28/2024      Objective:     BP (!) 115/56   Pulse 62   Ht 6' 1.23 (1.86 m)   Wt 212 lb 1.9 oz (96.2 kg)   SpO2 94%   BMI 27.81 kg/m    Physical Exam Gen: alert, oriented HEENT: perrla, eomi, mmm CV: rrr, no murmur Pulm: lctab. No wheeze or crackles.  GI: soft, nbs.  Nontender to palpation MSK: strength equal b/l. Normal gait Ext: no pedal edema Skin: warm and dry, no rashes Psych: pleasant  affect.  Spontaneous speech   No results found for any visits on 09/10/24.      Assessment & Plan:   Physical exam, annual  Healthcare maintenance Assessment & Plan: - Due for follow-up colonoscopy later this year (5 years after 10/2019). Patient will follow up with GI office in December if they have not been contacted. - PSA requires annual follow-up. - Will continue current medications and supplements. - Renewed prescription for clobetasol  ointment. - Flu vaccine administered in office today. - Plan to follow up in one year for annual physical.   Immunization due -     Flu vaccine HIGH DOSE PF(Fluzone Trivalent)  Psoriasis Assessment & Plan: Refilled clobetasol  today   Vitamin D  deficiency  Labile hypertension -     Comprehensive metabolic panel with GFR; Future -     CBC with Differential/Platelet; Future -     TSH; Future  Overweight -     Comprehensive metabolic panel with GFR; Future -     Lipid panel; Future -     TSH; Future -     Hemoglobin A1c; Future  Prostate cancer screening -     PSA; Future  Other orders -     Clobetasol  Propionate; APPLY TO AFFECTED AREA TWICE A DAY  Dispense: 180 g; Refill: 3     Return in about 1 year (around 09/10/2025) for physical.    Toribio MARLA Slain, MD

## 2024-09-10 NOTE — Patient Instructions (Signed)
 It was nice to see you today,  We addressed the following topics today: - I have sent in the prescription renewal for your clobetasol  ointment. - Please schedule your next annual physical at the front desk before you leave. - If you do not hear from the gastroenterology office about your colonoscopy, please call them in December to schedule it. - We will recheck your labs, including the PSA, at your physical next year.  Have a great day,  Rolan Slain, MD

## 2024-09-10 NOTE — Assessment & Plan Note (Signed)
-   Due for follow-up colonoscopy later this year (5 years after 10/2019). Patient will follow up with GI office in December if they have not been contacted. - PSA requires annual follow-up. - Will continue current medications and supplements. - Renewed prescription for clobetasol  ointment. - Flu vaccine administered in office today. - Plan to follow up in one year for annual physical.

## 2024-09-10 NOTE — Assessment & Plan Note (Signed)
Refilled clobetasol today 

## 2024-09-11 ENCOUNTER — Other Ambulatory Visit: Payer: Self-pay | Admitting: Family Medicine

## 2024-09-11 NOTE — Telephone Encounter (Signed)
 Copied from CRM [id number]. Topic: Clinical - Medication Refill >> Sep 11, 2024  1:24 PM Amber H wrote: Medication: clobetasol  ointment (TEMOVATE ) 0.05 %  Has the patient contacted their pharmacy? Yes, medication was sent to the wrong pharmacy and was going to charge 700$. Pharmacy updated.  (Agent: If no, request that the patient contact the pharmacy for the refill. If patient does not wish to contact the pharmacy document the reason why and proceed with request.) (Agent: If yes, when and what did the pharmacy advise?)  This is the patient's preferred pharmacy:  [facility] [id number] [place], [place] - [address] [place] [address] Phone: [phone] Fax: [phone]   Is this the correct pharmacy for this prescription? Yes If no, delete pharmacy and type the correct one.   Has the prescription been filled recently? Yes  Is the patient out of the medication? No but needs refill soon.   Has the patient been seen for an appointment in the last year OR does the patient have an upcoming appointment? Yes  Can we respond through MyChart? Yes  Agent: Please be advised that Rx refills may take up to 3 business days. We ask that you follow-up with your pharmacy.

## 2024-09-17 ENCOUNTER — Other Ambulatory Visit: Payer: Self-pay | Admitting: Family Medicine

## 2024-09-17 NOTE — Telephone Encounter (Signed)
 Copied from CRM #[id number]. Topic: Clinical - Medication Refill >> Sep 17, 2024  9:36 AM Everette C wrote: Medication: clobetasol  ointment (TEMOVATE ) 0.05 % [[id number]]  Has the patient contacted their pharmacy? Yes (Agent: If no, request that the patient contact the pharmacy for the refill. If patient does not wish to contact the pharmacy document the reason why and proceed with request.) (Agent: If yes, when and what did the pharmacy advise?)  This is the patient's preferred pharmacy:  [facility] [id number] [place], [place] - [address] [place] [address] Phone: [phone] Fax: [phone]  Is this the correct pharmacy for this prescription? Yes If no, delete pharmacy and type the correct one.   Has the prescription been filled recently? Yes  Is the patient out of the medication? Yes  Has the patient been seen for an appointment in the last year OR does the patient have an upcoming appointment? Yes  Can we respond through MyChart? No  Agent: Please be advised that Rx refills may take up to 3 business days. We ask that you follow-up with your pharmacy.

## 2024-09-21 ENCOUNTER — Other Ambulatory Visit: Payer: Self-pay | Admitting: Family Medicine

## 2024-09-21 NOTE — Telephone Encounter (Unsigned)
 Copied from CRM [id number]. Topic: Clinical - Prescription Issue >> Sep 21, 2024  5:15 PM Shanda MATSU wrote: Reason for CRM: Patient called to check status of med, clobetasol  ointment (TEMOVATE ) 0.05 %, patient stated that he is aware that med was sent to [facility] which it should not have been, patient stated it should have been sent to [facility] [id number] [place], [place] - [address] [place] [address] Phone: [phone] Fax: [phone]  Patient mentioned that he called [facility] as well and that they adv him they do not have the med either.

## 2024-09-24 ENCOUNTER — Encounter: Payer: Self-pay | Admitting: Internal Medicine

## 2024-09-24 ENCOUNTER — Other Ambulatory Visit: Payer: Self-pay

## 2024-09-24 MED ORDER — CLOBETASOL PROPIONATE 0.05 % EX OINT: TOPICAL_OINTMENT | CUTANEOUS | 3 refills | Status: AC

## 2024-10-03 ENCOUNTER — Encounter: Payer: Self-pay | Admitting: Internal Medicine

## 2024-10-04 ENCOUNTER — Telehealth: Payer: Self-pay

## 2024-10-04 NOTE — Telephone Encounter (Signed)
 Pt is coming in on Monday. Will call back to cancel if not needed.

## 2024-10-04 NOTE — Telephone Encounter (Signed)
 Copied from CRM #[id number]. Topic: General - Other >> Oct 04, 2024  3:31 PM Olam RAMAN wrote: Reason for CRM: Pt twisted L knee with no pain or swelling. Asked if he would like a call back pink from nurse. Pt declined and stated he wanted to walk in. I advised it would be appt only and pt got upset and said nevermind

## 2024-10-08 ENCOUNTER — Encounter: Payer: Self-pay | Admitting: Family Medicine

## 2024-10-08 ENCOUNTER — Ambulatory Visit: Admitting: Family Medicine

## 2024-10-08 VITALS — BP 114/56 | HR 68 | Ht 73.23 in | Wt 214.8 lb

## 2024-10-08 DIAGNOSIS — M76892 Other specified enthesopathies of left lower limb, excluding foot: Secondary | ICD-10-CM | POA: Insufficient documentation

## 2024-10-08 DIAGNOSIS — M25562 Pain in left knee: Secondary | ICD-10-CM

## 2024-10-08 NOTE — Assessment & Plan Note (Addendum)
  Left knee pain, likely hamstring strain vs. referred pain. The patient presents with left posterior knee pain that began last week and acutely worsened after a misstep. The pain is sharp, non-radiating, and worsened with ambulation. Exam is notable for tenderness over the distal hamstring insertion. The location is atypical for classic intra-articular knee pathology. Initial management will be conservative. - X-ray of the left knee was ordered to rule out underlying bony pathology. The order is valid for one year and can be done at the patient's convenience. - Referral placed for formal physical therapy to focus on leg strengthening, stretching, and rehabilitation exercises. - Discussed pain management options. Advised that naproxen (Aleve) may be a safer NSAID for multi-week use compared to ibuprofen  and can be trialed. - Discussed conservative management including rest and applied heat. - Advised that if symptoms do not improve in another 3 weeks (4 weeks total), follow-up with an orthopedist or sports medicine specialist is recommended for further evaluation. The patient can self-refer to their previous orthopedic specialist.

## 2024-10-08 NOTE — Patient Instructions (Signed)
 It was nice to see you today,  We addressed the following topics today: - I have ordered an X-ray of your left knee. You can go to an imaging center to have this done at your convenience; the order is good for a year. I recommend getting it done to be safe, but you can also choose to wait a few weeks. - I am sending a referral for physical therapy. I recommend you attend sessions with a therapist so they can show you the correct exercises and provide feedback. - For pain, you can try over-the-counter naproxen (Aleve) instead of ibuprofen , as it may be gentler on your stomach with prolonged use. If it does not help, you can return to using ibuprofen . - You can take Tylenol  (acetaminophen ) 1000 mg every 8 hours as needed for additional pain relief. - Apply a heating pad to the area for comfort. - Rest the knee and avoid aggravating activities for the next 3-4 weeks. - If your pain is not getting better in 3 weeks, you should schedule an appointment with an orthopedic specialist. You do not need a referral from me to do this and can call their office directly.  Have a great day,  Rolan Slain, MD

## 2024-10-08 NOTE — Progress Notes (Unsigned)
   Acute Office Visit  Subjective:     Patient ID: Christopher Lawrence, male    DOB: 01/27/1956, 68 y.o.   MRN: [id number]  Chief Complaint  Patient presents with   Knee Pain    HPI Patient is in today for   - Left knee pain. Onset early last week during a walk, with gradual improvement. Subsequently experienced a misstep on an incline, causing acute, excruciating pain. Pain has gradually increased since. Reports difficulty with stairs, using one step at a time. Pain is exacerbated by pressure on the front part of the foot. Pain is localized to the posterior aspect of the knee. Reports sharp pain. Denies burning, tingling, or radiation to the foot. Denies bruising or swelling. Denies knee buckling or giving out. Staying off the knee provides relief.  Medications: Ibuprofen  (Motrin  store brand equivalent) once daily, but has not taken it in the last two days.  PMH: Arthritis in various locations. PSH: None mentioned. FH: None mentioned. Social Hx: Reports regular morning walks.  ROS: Constitutional: Denies fever. Musculoskeletal: Reports left knee pain. Denies locking. Reports decreased range of motion. Neurological: Denies burning, tingling, radiation of pain.   ROS      Objective:    BP (!) 114/56   Pulse 68   Ht 6' 1.23 (1.86 m)   Wt 214 lb 12.8 oz (97.4 kg)   SpO2 93%   BMI 28.16 kg/m  {Vitals History (Optional):23777}  Physical Exam Gen: alert, oriented Pulm: no respiratory distress MSK: Tenderness to palpation over the posterior aspect of the left knee, superior to the popliteal fossa, consistent with the hamstring insertion area. No tenderness over the medial or lateral joint line. Full passive range of motion without pain. Pain elicited with resisted flexion and on active straight leg raise, localized to the posterior knee/distal hamstring. No effusion noted. Psych: pleasant affect  No results found for any visits on 10/08/24.      Assessment & Plan:   Hamstring  tendonitis of left thigh Assessment & Plan:  Left knee pain, likely hamstring strain vs. referred pain. The patient presents with left posterior knee pain that began last week and acutely worsened after a misstep. The pain is sharp, non-radiating, and worsened with ambulation. Exam is notable for tenderness over the distal hamstring insertion. The location is atypical for classic intra-articular knee pathology. Initial management will be conservative. - X-ray of the left knee was ordered to rule out underlying bony pathology. The order is valid for one year and can be done at the patient's convenience. - Referral placed for formal physical therapy to focus on leg strengthening, stretching, and rehabilitation exercises. - Discussed pain management options. Advised that naproxen (Aleve) may be a safer NSAID for multi-week use compared to ibuprofen  and can be trialed. - Discussed conservative management including rest and applied heat. - Advised that if symptoms do not improve in another 3 weeks (4 weeks total), follow-up with an orthopedist or sports medicine specialist is recommended for further evaluation. The patient can self-refer to their previous orthopedic specialist.  Orders: -     Ambulatory referral to Physical Therapy -     DG Knee 1-2 Views Left; Future  Acute pain of left knee -     DG Knee 1-2 Views Left; Future     Return if symptoms worsen or fail to improve.  Toribio MARLA Slain, MD

## 2024-10-15 NOTE — Therapy (Signed)
 OUTPATIENT PHYSICAL THERAPY LOWER EXTREMITY EVALUATION   Patient Name: Christopher Lawrence MRN: [id number] DOB:04/23/1956, 68 y.o., male Today's Date: 10/16/2024  END OF SESSION:  PT End of Session - 10/16/24 1509     Visit Number 1    Number of Visits 13    Date for Recertification  11/27/24    PT Start Time 1145    PT Stop Time 1225    PT Time Calculation (min) 40 min    Activity Tolerance Patient tolerated treatment well    Behavior During Therapy [facility] for tasks assessed/performed          Past Medical History:  Diagnosis Date   Allergy    GERD (gastroesophageal reflux disease)    Hx of adenomatous polyp of colon 10/27/2016   Labile hypertension    Other testicular hypofunction    Psoriasis    Past Surgical History:  Procedure Laterality Date   COLONOSCOPY  10/2016   COLONOSCOPY N/A 10/2016   HEMORRHOID BANDING  04/2011   HERNIA REPAIR Right 1958   INSERTION OF MESH N/A 08/30/2014   Procedure: INSERTION OF MESH;  Surgeon: Elspeth Schultze, MD;  Location: [facility] OR;  Service: General;  Laterality: N/A;   LAPAROSCOPIC INGUINAL HERNIA WITH UMBILICAL HERNIA N/A 08/30/2014   Procedure: LAPAROSCOPIC EXPLORATION AND REPAIR OF BILATERAL HERNIAS IN  GROINS AND UMBILICAL HERNIA REPAIR.;  Surgeon: Elspeth Schultze, MD;  Location: [facility] OR;  Service: General;  Laterality: N/A;   POLYPECTOMY     Patient Active Problem List   Diagnosis Date Noted   Hamstring tendonitis of left thigh 10/08/2024   Healthcare maintenance 09/10/2024   Right hip pain 03/10/2024   Hx of adenomatous polyp of colon 10/27/2016   Vitamin D  deficiency 07/26/2014   Gastroesophageal reflux disease    Labile hypertension    Testosterone  deficiency    Psoriasis     PCP: Chandra Toribio POUR, MD   REFERRING PROVIDER: Chandra Toribio POUR, MD   REFERRING DIAG:  Diagnosis  [phone] (ICD-10-CM) - Hamstring tendonitis of left thigh    THERAPY DIAG:  Strain of left hamstring muscle, subsequent encounter  Muscle weakness  (generalized)  Acute pain of left knee  Rationale for Evaluation and Treatment: Rehabilitation  ONSET DATE: couple weeks  SUBJECTIVE:   SUBJECTIVE STATEMENT: Had pain in L knee after his normal walk in the morning.  L knee pain resolved next day and there was a second instance very similar to the first.  Pt reports no swelling with either instance.  Pain limited to ambulation, stairs and terminal knee extension.    PERTINENT HISTORY: + OA hips  PAIN:  Are you having pain? Yes: NPRS scale: 0/10 to 5/10 with inc walking Pain location: lateral knee /hamstring Pain description: dull, sharp Aggravating factors: ambulation, stairs Relieving factors: rest  PRECAUTIONS: None  RED FLAGS: None   WEIGHT BEARING RESTRICTIONS: No  FALLS:  Has patient fallen in last 6 months? No  LIVING ENVIRONMENT: Lives with: lives with their family Lives in: House/apartment Stairs: Yes: Internal: 6 steps; on right going up Has following equipment at home: None  OCCUPATION: retired  PLOF: Independent  PATIENT GOALS: decrease pain  NEXT MD VISIT: unknown2.5  OBJECTIVE:  Note: Objective measures were completed at Evaluation unless otherwise noted.  DIAGNOSTIC FINDINGS: no xrays  PATIENT SURVEYS:  PSFS: THE PATIENT SPECIFIC FUNCTIONAL SCALE  Place score of 0-10 (0 = unable to perform activity and 10 = able to perform activity at the same level as before injury  or problem)  Activity Date: 12.16.25    Walking 2.5 miles  5    2.alternating up stairs 0    3.     4.      Total Score 2.5      Total Score = Sum of activity scores/number of activities  Minimally Detectable Change: 3 points (for single activity); 2 points (for average score)  [employer] [facility] (nd). The Patient Specific Functional Scale . Retrieved from [email or web address]   COGNITION: Overall cognitive status: Within functional limits for tasks  assessed     SENSATION: WFL  EDEMA:  NT  MUSCLE LENGTH: Hamstrings:  Left moderate restriction deg Gastrocsoleus: Left moderate rest  POSTURE: No Significant postural limitations  PALPATION: No palpable tenderness   LOWER EXTREMITY ROM:  Active ROM Right eval Left eval  Hip flexion  75%  Hip extension    Hip abduction    Hip adduction    Hip internal rotation    Hip external rotation    Knee flexion    Knee extension  +4 then pain to 0  Ankle dorsiflexion    Ankle plantarflexion    Ankle inversion    Ankle eversion     (Blank rows = not tested)  LOWER EXTREMITY MMT:  MMT Right eval Left eval  Hip flexion  4+  Hip extension    Hip abduction    Hip adduction    Hip internal rotation    Hip external rotation    Knee flexion  4+  Knee extension  4+  Ankle dorsiflexion    Ankle plantarflexion    Ankle inversion    Ankle eversion     (Blank rows = not tested)  LOWER EXTREMITY SPECIAL TESTS:  Hip special tests: Belvie (FABER) test: negative + Pain with resisted knee flexion  FUNCTIONAL TESTS:  Step ups 8  step + pain   GAIT: Distance walked: community  Assistive device utilized: None Level of assistance: Complete Independence Comments: --                                                                                                                                TREATMENT DATE:  10/16/24 Initial evaluation of L hamstring completed followed by instruction and trial set of HEP for stretching of L hamstring.   PATIENT EDUCATION:  Education details: HEP Person educated: Patient Education method: Programmer, Multimedia, Facilities Manager, Actor cues, Verbal cues, and Handouts Education comprehension: verbalized understanding, returned demonstration, and verbal cues required  HOME EXERCISE PROGRAM: Access Code: [email or web address] URL: [email or web address] Date: 10/16/2024 Prepared by: Burnard Meth  Exercises - Long Sitting Calf Stretch with Strap  -  2 x daily - 7 x weekly - 1 sets - 3 reps - 25 hold - Gastroc Stretch on Wall  - 2 x daily - 7 x weekly - 1 sets - 3 reps - 25 hold - Hooklying Hamstring Stretch with Strap  - 2 x daily - 7  x weekly - 1 sets - 3 reps - 25 hold - Standing Hamstring Stretch on Chair  - 2 x daily - 7 x weekly - 1 sets - 3 reps - 25 hold  ASSESSMENT:  CLINICAL IMPRESSION: Patient is a 68 y.o. male who was seen today for physical therapy evaluation and treatment for L hamstring tendonitis. He presents with pain in L lateral knee/hamstring with prolonged walking and  stairs. Patient would benefit from skilled physical therapy services to resolve above deficits and return to previous LOA.  OBJECTIVE IMPAIRMENTS: difficulty walking, decreased strength, impaired flexibility, and pain.   ACTIVITY LIMITATIONS: stairs  PARTICIPATION LIMITATIONS: community activity  PERSONAL FACTORS: 1-2 comorbidities: OA hips are also affecting patient's functional outcome.   REHAB POTENTIAL: Good  CLINICAL DECISION MAKING: Stable/uncomplicated  EVALUATION COMPLEXITY: Low   GOALS: Goals reviewed with patient? Yes  SHORT TERM GOALS: Target date: 11/06/2024    Pt to be independent with HEP. Baseline: Goal status: INITIAL  2.  Decrease pain by 1 level. Baseline:  Goal status: INITIAL  LONG TERM GOALS: Target date: 11/27/2024  6 weeks    Pt to be independent with self progressive HEP at discharge.   Baseline:  Goal status: INITIAL  2.  Increase flexibility of L hamstrings and gastroc to mild restriction. Baseline:  Goal status: INITIAL  3.  Increase hip/LE strength by 1/2 grade to 1 full grade. Baseline:  Goal status: INITIAL  4.  Decrease max pain to 2/10 with normal morning walk.  Baseline:  Goal status: INITIAL   PLAN:  PT FREQUENCY: 1-2x/week  PT DURATION: 6 weeks  PLANNED INTERVENTIONS: 97164- PT Re-evaluation, 97110-Therapeutic exercises, 97530- Therapeutic activity, [id number]- Neuromuscular  re-education, 97535- Self Care, 02859- Manual therapy, [id number]- Gait training, [phone]- Aquatic Therapy, [phone]- Electrical stimulation (unattended), [phone] (1-2 muscles), 20561 (3+ muscles)- Dry Needling, Patient/Family education, Balance training, Joint mobilization, Spinal mobilization, Cryotherapy, and Moist heat  PLAN FOR NEXT SESSION: increase stretching protocol for LE.   Burnard CHRISTELLA Meth, PT 10/16/2024, 3:12 PM

## 2024-10-16 ENCOUNTER — Ambulatory Visit (INDEPENDENT_AMBULATORY_CARE_PROVIDER_SITE_OTHER)

## 2024-10-16 DIAGNOSIS — M6281 Muscle weakness (generalized): Secondary | ICD-10-CM

## 2024-10-16 DIAGNOSIS — M25562 Pain in left knee: Secondary | ICD-10-CM

## 2024-10-16 DIAGNOSIS — S76312D Strain of muscle, fascia and tendon of the posterior muscle group at thigh level, left thigh, subsequent encounter: Secondary | ICD-10-CM

## 2024-10-19 ENCOUNTER — Ambulatory Visit

## 2024-10-19 VITALS — Ht 73.0 in | Wt 207.0 lb

## 2024-10-19 DIAGNOSIS — Z860100 Personal history of colon polyps, unspecified: Secondary | ICD-10-CM

## 2024-10-19 NOTE — Therapy (Signed)
 " OUTPATIENT PHYSICAL THERAPY LOWER EXTREMITY TREATMENT  Patient Name: Christopher Lawrence MRN: [id number] DOB:04-20-1956, 68 y.o., male Today's Date: 10/22/2024  END OF SESSION:  PT End of Session - 10/22/24 0932     Visit Number 2    Number of Visits 13    Date for Recertification  11/27/24    PT Start Time 0845    PT Stop Time 0923    PT Time Calculation (min) 38 min    Activity Tolerance Patient tolerated treatment well    Behavior During Therapy [facility] for tasks assessed/performed           Past Medical History:  Diagnosis Date   Allergy    Arthritis    Located in hips   GERD (gastroesophageal reflux disease)    Hx of adenomatous polyp of colon 10/27/2016   Labile hypertension    Other testicular hypofunction    Psoriasis    Past Surgical History:  Procedure Laterality Date   COLONOSCOPY  10/2016   COLONOSCOPY N/A 10/2016   HEMORRHOID BANDING  04/2011   HERNIA REPAIR Right 1958   INSERTION OF MESH N/A 08/30/2014   Procedure: INSERTION OF MESH;  Surgeon: Elspeth Schultze, MD;  Location: [facility] OR;  Service: General;  Laterality: N/A;   LAPAROSCOPIC INGUINAL HERNIA WITH UMBILICAL HERNIA N/A 08/30/2014   Procedure: LAPAROSCOPIC EXPLORATION AND REPAIR OF BILATERAL HERNIAS IN  GROINS AND UMBILICAL HERNIA REPAIR.;  Surgeon: Elspeth Schultze, MD;  Location: [facility];  Service: General;  Laterality: N/A;   POLYPECTOMY     Patient Active Problem List   Diagnosis Date Noted   Hamstring tendonitis of left thigh 10/08/2024   Healthcare maintenance 09/10/2024   Right hip pain 03/10/2024   Hx of adenomatous polyp of colon 10/27/2016   Vitamin D  deficiency 07/26/2014   Gastroesophageal reflux disease    Labile hypertension    Testosterone  deficiency    Psoriasis     PCP: Chandra Toribio POUR, MD   REFERRING PROVIDER: Chandra Toribio POUR, MD   REFERRING DIAG:  Diagnosis  [phone] (ICD-10-CM) - Hamstring tendonitis of left thigh    THERAPY DIAG:  Strain of left hamstring muscle, subsequent  encounter  Muscle weakness (generalized)  Acute pain of left knee  Rationale for Evaluation and Treatment: Rehabilitation  ONSET DATE: couple weeks  SUBJECTIVE:   SUBJECTIVE STATEMENT: Pt reports stretching is helping and he can alternate on the stairs now.  PERTINENT HISTORY: + OA hips  PAIN:  Are you having pain? Yes: NPRS scale: 1/10  Pain location: lateral knee /hamstring Pain description: dull, sharp Aggravating factors: ambulation, stairs Relieving factors: rest  PRECAUTIONS: None  RED FLAGS: None   WEIGHT BEARING RESTRICTIONS: No  FALLS:  Has patient fallen in last 6 months? No  LIVING ENVIRONMENT: Lives with: lives with their family Lives in: House/apartment Stairs: Yes: Internal: 6 steps; on right going up Has following equipment at home: None  OCCUPATION: retired  PLOF: Independent  PATIENT GOALS: decrease pain  NEXT MD VISIT: unknown2.5  OBJECTIVE:  Note: Objective measures were completed at Evaluation unless otherwise noted.  DIAGNOSTIC FINDINGS: no xrays  PATIENT SURVEYS:  PSFS: THE PATIENT SPECIFIC FUNCTIONAL SCALE  Place score of 0-10 (0 = unable to perform activity and 10 = able to perform activity at the same level as before injury or problem)  Activity Date: 12.16.25    Walking 2.5 miles  5    2.alternating up stairs 0    3.     4.  Total Score 2.5      Total Score = Sum of activity scores/number of activities  Minimally Detectable Change: 3 points (for single activity); 2 points (for average score)  [employer] [facility] (nd). The Patient Specific Functional Scale . Retrieved from [email or web address]   COGNITION: Overall cognitive status: Within functional limits for tasks assessed     SENSATION: WFL  EDEMA:  NT  MUSCLE LENGTH: Hamstrings:  Left moderate restriction deg Gastrocsoleus: Left moderate rest  POSTURE: No Significant postural  limitations  PALPATION: No palpable tenderness   LOWER EXTREMITY ROM:  Active ROM Right eval Left eval  Hip flexion  75%  Hip extension    Hip abduction    Hip adduction    Hip internal rotation    Hip external rotation    Knee flexion    Knee extension  +4 then pain to 0  Ankle dorsiflexion    Ankle plantarflexion    Ankle inversion    Ankle eversion     (Blank rows = not tested)  LOWER EXTREMITY MMT:  MMT Right eval Left eval  Hip flexion  4+  Hip extension    Hip abduction    Hip adduction    Hip internal rotation    Hip external rotation    Knee flexion  4+  Knee extension  4+  Ankle dorsiflexion    Ankle plantarflexion    Ankle inversion    Ankle eversion     (Blank rows = not tested)  LOWER EXTREMITY SPECIAL TESTS:  Hip special tests: Belvie (FABER) test: negative + Pain with resisted knee flexion  FUNCTIONAL TESTS:  Step ups 8  step + pain   GAIT: Distance walked: Financial [employer] device utilized: None Level of assistance: Complete Independence Comments: --                                                                                                                                TREATMENT DATE:  10/22/24 Rec bike 7 min level 1 Leg Press #112 3x10 Figure four stretch 3x25 sec Incline board stretch for gastrocs 3x25 sec TB supine hip abduction 3x10 blue Hamstring strap stretch 3x30 sec ITB and Inner thigh strap stretch 1x each   10/16/24 Initial evaluation of L hamstring completed followed by instruction and trial set of HEP for stretching of L hamstring.   PATIENT EDUCATION:  Education details: HEP Person educated: Patient Education method: Programmer, Multimedia, Facilities Manager, Actor cues, Verbal cues, and Handouts Education comprehension: verbalized understanding, returned demonstration, and verbal cues required  HOME EXERCISE PROGRAM: Access Code: [email or web address] URL: [email or web address] Date: 10/16/2024 Prepared by:  Burnard Meth  Exercises - Long Sitting Calf Stretch with Strap  - 2 x daily - 7 x weekly - 1 sets - 3 reps - 25 hold - Gastroc Stretch on Wall  - 2 x daily - 7 x weekly - 1 sets - 3 reps - 25 hold - Hooklying Hamstring Stretch  with Strap  - 2 x daily - 7 x weekly - 1 sets - 3 reps - 25 hold - Standing Hamstring Stretch on Chair  - 2 x daily - 7 x weekly - 1 sets - 3 reps - 25 hold  ASSESSMENT:  CLINICAL IMPRESSION: Patient needed VC for new exercises.  Demonstrayted understanding..  OBJECTIVE IMPAIRMENTS: difficulty walking, decreased strength, impaired flexibility, and pain.   ACTIVITY LIMITATIONS: stairs  PARTICIPATION LIMITATIONS: community activity  PERSONAL FACTORS: 1-2 comorbidities: OA hips are also affecting patient's functional outcome.   REHAB POTENTIAL: Good  CLINICAL DECISION MAKING: Stable/uncomplicated  EVALUATION COMPLEXITY: Low   GOALS: Goals reviewed with patient? Yes  SHORT TERM GOALS: Target date: 11/06/2024    Pt to be independent with HEP. Baseline: Goal status: INITIAL  2.  Decrease pain by 1 level. Baseline:  Goal status: INITIAL  LONG TERM GOALS: Target date: 11/27/2024  6 weeks    Pt to be independent with self progressive HEP at discharge.   Baseline:  Goal status: INITIAL  2.  Increase flexibility of L hamstrings and gastroc to mild restriction. Baseline:  Goal status: INITIAL  3.  Increase hip/LE strength by 1/2 grade to 1 full grade. Baseline:  Goal status: INITIAL  4.  Decrease max pain to 2/10 with normal morning walk.  Baseline:  Goal status: INITIAL   PLAN:  PT FREQUENCY: 1-2x/week  PT DURATION: 6 weeks  PLANNED INTERVENTIONS: 97164- PT Re-evaluation, 97110-Therapeutic exercises, 97530- Therapeutic activity, [id number]- Neuromuscular re-education, 97535- Self Care, 02859- Manual therapy, [id number]- Gait training, [phone]- Aquatic Therapy, [phone]- Electrical stimulation (unattended), [phone] (1-2 muscles), 20561 (3+ muscles)- Dry  Needling, Patient/Family education, Balance training, Joint mobilization, Spinal mobilization, Cryotherapy, and Moist heat  PLAN FOR NEXT SESSION: increase stretching protocol for LE.   Burnard CHRISTELLA Meth, PT 10/22/2024, 9:33 AM  "

## 2024-10-19 NOTE — Progress Notes (Signed)

## 2024-10-22 ENCOUNTER — Ambulatory Visit

## 2024-10-22 DIAGNOSIS — M6281 Muscle weakness (generalized): Secondary | ICD-10-CM

## 2024-10-22 DIAGNOSIS — S76312D Strain of muscle, fascia and tendon of the posterior muscle group at thigh level, left thigh, subsequent encounter: Secondary | ICD-10-CM | POA: Diagnosis not present

## 2024-10-22 DIAGNOSIS — M25562 Pain in left knee: Secondary | ICD-10-CM

## 2024-10-26 ENCOUNTER — Encounter: Payer: Self-pay | Admitting: Internal Medicine

## 2024-10-30 ENCOUNTER — Encounter: Payer: Self-pay | Admitting: Physical Therapy

## 2024-10-30 ENCOUNTER — Ambulatory Visit (INDEPENDENT_AMBULATORY_CARE_PROVIDER_SITE_OTHER): Admitting: Physical Therapy

## 2024-10-30 DIAGNOSIS — M6281 Muscle weakness (generalized): Secondary | ICD-10-CM

## 2024-10-30 DIAGNOSIS — M25562 Pain in left knee: Secondary | ICD-10-CM

## 2024-10-30 DIAGNOSIS — S76312D Strain of muscle, fascia and tendon of the posterior muscle group at thigh level, left thigh, subsequent encounter: Secondary | ICD-10-CM

## 2024-10-30 NOTE — Therapy (Signed)
 " OUTPATIENT PHYSICAL THERAPY LOWER EXTREMITY TREATMENT  Patient Name: Christopher Lawrence MRN: [id number] DOB:12-20-1955, 68 y.o., male Today's Date: 10/30/2024  END OF SESSION:  PT End of Session - 10/30/24 1417     Visit Number 3    Number of Visits 13    Date for Recertification  11/27/24    PT Start Time 1345    PT Stop Time 1428    PT Time Calculation (min) 43 min    Activity Tolerance Patient tolerated treatment well    Behavior During Therapy [facility] for tasks assessed/performed            Past Medical History:  Diagnosis Date   Allergy    Arthritis    Located in hips   GERD (gastroesophageal reflux disease)    Hx of adenomatous polyp of colon 10/27/2016   Labile hypertension    Other testicular hypofunction    Psoriasis    Past Surgical History:  Procedure Laterality Date   COLONOSCOPY  10/2016   COLONOSCOPY N/A 10/2016   HEMORRHOID BANDING  04/2011   HERNIA REPAIR Right 1958   INSERTION OF MESH N/A 08/30/2014   Procedure: INSERTION OF MESH;  Surgeon: Elspeth Schultze, MD;  Location: [facility] OR;  Service: General;  Laterality: N/A;   LAPAROSCOPIC INGUINAL HERNIA WITH UMBILICAL HERNIA N/A 08/30/2014   Procedure: LAPAROSCOPIC EXPLORATION AND REPAIR OF BILATERAL HERNIAS IN  GROINS AND UMBILICAL HERNIA REPAIR.;  Surgeon: Elspeth Schultze, MD;  Location: [facility] OR;  Service: General;  Laterality: N/A;   POLYPECTOMY     Patient Active Problem List   Diagnosis Date Noted   Hamstring tendonitis of left thigh 10/08/2024   Healthcare maintenance 09/10/2024   Right hip pain 03/10/2024   Hx of adenomatous polyp of colon 10/27/2016   Vitamin D  deficiency 07/26/2014   Gastroesophageal reflux disease    Labile hypertension    Testosterone  deficiency    Psoriasis     PCP: Chandra Toribio POUR, MD   REFERRING PROVIDER: Vernetta Lonni GRADE, MD   REFERRING DIAG:  Diagnosis  [phone] (ICD-10-CM) - Hamstring tendonitis of left thigh    THERAPY DIAG:  Strain of left hamstring muscle,  subsequent encounter  Muscle weakness (generalized)  Acute pain of left knee  Rationale for Evaluation and Treatment: Rehabilitation  ONSET DATE: couple weeks  SUBJECTIVE:   SUBJECTIVE STATEMENT: Pt reporting 1-2/10.      PERTINENT HISTORY: + OA hips  PAIN:  Are you having pain? Yes: NPRS scale: 1-2/10  Pain location: lateral knee /hamstring Pain description: dull, sharp Aggravating factors: ambulation, stairs Relieving factors: rest  PRECAUTIONS: None  RED FLAGS: None   WEIGHT BEARING RESTRICTIONS: No  FALLS:  Has patient fallen in last 6 months? No  LIVING ENVIRONMENT: Lives with: lives with their family Lives in: House/apartment Stairs: Yes: Internal: 6 steps; on right going up Has following equipment at home: None  OCCUPATION: retired  PLOF: Independent  PATIENT GOALS: decrease pain  NEXT MD VISIT: unknown2.5  OBJECTIVE:  Note: Objective measures were completed at Evaluation unless otherwise noted.  DIAGNOSTIC FINDINGS: no xrays  PATIENT SURVEYS:  PSFS: THE PATIENT SPECIFIC FUNCTIONAL SCALE  Place score of 0-10 (0 = unable to perform activity and 10 = able to perform activity at the same level as before injury or problem)  Activity Date: 12.16.25    Walking 2.5 miles  5    2.alternating up stairs 0    3.     4.      Total  Score 2.5      Total Score = Sum of activity scores/number of activities  Minimally Detectable Change: 3 points (for single activity); 2 points (for average score)  [employer] [facility] (nd). The Patient Specific Functional Scale . Retrieved from [email or web address]   COGNITION: Overall cognitive status: Within functional limits for tasks assessed     SENSATION: WFL  EDEMA:  NT  MUSCLE LENGTH: Hamstrings:  Left moderate restriction deg Gastrocsoleus: Left moderate rest  POSTURE: No Significant postural limitations  PALPATION: No palpable tenderness    LOWER EXTREMITY ROM:  Active ROM Right eval Left eval  Hip flexion  75%  Hip extension    Hip abduction    Hip adduction    Hip internal rotation    Hip external rotation    Knee flexion    Knee extension  +4 then pain to 0  Ankle dorsiflexion    Ankle plantarflexion    Ankle inversion    Ankle eversion     (Blank rows = not tested)  LOWER EXTREMITY MMT:  MMT Right eval Left eval  Hip flexion  4+  Hip extension    Hip abduction    Hip adduction    Hip internal rotation    Hip external rotation    Knee flexion  4+  Knee extension  4+  Ankle dorsiflexion    Ankle plantarflexion    Ankle inversion    Ankle eversion     (Blank rows = not tested)  LOWER EXTREMITY SPECIAL TESTS:  Hip special tests: Belvie (FABER) test: negative + Pain with resisted knee flexion  FUNCTIONAL TESTS:  Step ups 8  step + pain   GAIT: Distance walked: community  Assistive device utilized: None Level of assistance: Complete Independence Comments: --  =================================================================                                                                                                                            TREATMENT DATE:  10/30/24:  TherEx Recumbent bike x 5 minutes, level 4  Standing Hip flexor stretch in lunge position x 3 holding 30 sec Gastroc and soleus stretch on slant board x 2 each 30 sec Seated hamstring stretch: x 3 on left LE Calf raise: double leg up, single leg down x 10 bil c UE support TherAct Double leg press: 112# 3 x 10  Single leg press: 62# 2 x 10 bil LE Manual:  Percussion  device to bil calves and left hamstring Self Care Using tennis ball or massager at home for bil calfs and hamstring as needed    10/22/24 Rec bike 7 min level 1 Leg Press #112 3x10 Figure four stretch 3x25 sec Incline board stretch for gastrocs 3x25 sec TB supine hip abduction 3x10 blue Hamstring strap stretch 3x30 sec ITB and Inner thigh  strap stretch 1x each   10/16/24 Initial evaluation of L hamstring completed followed by instruction and trial set of HEP for stretching of  L hamstring.   PATIENT EDUCATION:  Education details: HEP Person educated: Patient Education method: Programmer, Multimedia, Facilities Manager, Actor cues, Verbal cues, and Handouts Education comprehension: verbalized understanding, returned demonstration, and verbal cues required  HOME EXERCISE PROGRAM: Access Code: [email or web address] URL: [email or web address] Date: 10/16/2024 Prepared by: Burnard Meth  Exercises - Long Sitting Calf Stretch with Strap  - 2 x daily - 7 x weekly - 1 sets - 3 reps - 25 hold - Gastroc Stretch on Wall  - 2 x daily - 7 x weekly - 1 sets - 3 reps - 25 hold - Hooklying Hamstring Stretch with Strap  - 2 x daily - 7 x weekly - 1 sets - 3 reps - 25 hold - Standing Hamstring Stretch on Chair  - 2 x daily - 7 x weekly - 1 sets - 3 reps - 25 hold  ASSESSMENT:  CLINICAL IMPRESSION Pt arriving today reporting 1-2/10 pain in his left mid to upper hamstring. HEP reviewed and pt tolerating strengthening and stretching well. Pt with good tolerance to manual percussion. Pt edu in using towel roll for calf stretches at home. Continue skilled PT.      OBJECTIVE IMPAIRMENTS: difficulty walking, decreased strength, impaired flexibility, and pain.   ACTIVITY LIMITATIONS: stairs  PARTICIPATION LIMITATIONS: community activity  PERSONAL FACTORS: 1-2 comorbidities: OA hips are also affecting patient's functional outcome.   REHAB POTENTIAL: Good  CLINICAL DECISION MAKING: Stable/uncomplicated  EVALUATION COMPLEXITY: Low   GOALS: Goals reviewed with patient? Yes  SHORT TERM GOALS: Target date: 11/06/2024    Pt to be independent with HEP. Baseline: Goal status: ongoing 10/30/24  2.  Decrease pain by 1 level. Baseline:  Goal status: ongoing 10/30/24  LONG TERM GOALS: Target date: 11/27/2024  6 weeks    Pt to be independent  with self progressive HEP at discharge.   Baseline:  Goal status: INITIAL  2.  Increase flexibility of L hamstrings and gastroc to mild restriction. Baseline:  Goal status: INITIAL  3.  Increase hip/LE strength by 1/2 grade to 1 full grade. Baseline:  Goal status: INITIAL  4.  Decrease max pain to 2/10 with normal morning walk.  Baseline:  Goal status: INITIAL   PLAN:  PT FREQUENCY: 1-2x/week  PT DURATION: 6 weeks  PLANNED INTERVENTIONS: 97164- PT Re-evaluation, 97110-Therapeutic exercises, 97530- Therapeutic activity, 97112- Neuromuscular re-education, 97535- Self Care, 02859- Manual therapy, [phone]- Gait training, [phone]- Aquatic Therapy, [phone]- Electrical stimulation (unattended), [phone] (1-2 muscles), 20561 (3+ muscles)- Dry Needling, Patient/Family education, Balance training, Joint mobilization, Spinal mobilization, Cryotherapy, and Moist heat  PLAN FOR NEXT SESSION: progress ROM and begin strengthening as tolerated, percussion as needed   Delon JONELLE Lunger, PT, MPT 10/30/2024, 2:34 PM  "

## 2024-11-01 NOTE — Progress Notes (Unsigned)
 Christopher Lawrence   Primary Care Physician:  Christopher Toribio POUR, MD   Reason for Procedure:    Encounter Diagnosis  Name Primary?   Hx of colonic polyps Yes     Plan:    Colonoscopy   The patient was provided an opportunity to ask questions and all were answered. The patient agreed with the plan.   HPI: Christopher Lawrence is a 69 y.o. male with a history of colon polyps, as reflected below:  10/2016 - 15 mm TV adenoma 10/2019 3 distal hyperplastics  recall 10/2024   Past Medical History:  Diagnosis Date   Allergy    Arthritis    Located in hips   GERD (gastroesophageal reflux disease)    Hx of adenomatous polyp of colon 10/27/2016   Labile hypertension    Other testicular hypofunction    Psoriasis     Past Surgical History:  Procedure Laterality Date   COLONOSCOPY  10/2016   COLONOSCOPY N/A 10/2016   HEMORRHOID BANDING  04/2011   HERNIA REPAIR Right 1958   INSERTION OF MESH N/A 08/30/2014   Procedure: INSERTION OF MESH;  Surgeon: Christopher Schultze, MD;  Location: [facility] OR;  Service: General;  Laterality: N/A;   LAPAROSCOPIC INGUINAL HERNIA WITH UMBILICAL HERNIA N/A 08/30/2014   Procedure: LAPAROSCOPIC EXPLORATION AND REPAIR OF BILATERAL HERNIAS IN  GROINS AND UMBILICAL HERNIA REPAIR.;  Surgeon: Christopher Schultze, MD;  Location: [facility] OR;  Service: General;  Laterality: N/A;   POLYPECTOMY       Current Outpatient Medications  Medication Sig Dispense Refill   ascorbic acid (VITAMIN C) 500 MG tablet Take 500 mg by mouth daily.     Calcium Carbonate (CALCIUM 600 PO) Take 1 tablet by mouth daily.     Cholecalciferol (VITAMIN D  PO) Take 2,000 Units by mouth 2 (two) times daily. 5,000 units     clobetasol  ointment (TEMOVATE ) 0.05 % APPLY  OINTMENT TOPICALLY TO PSORIASIS RASH TWICE DAILY AS NEEDED 180 g 3   fexofenadine  (ALLEGRA ) 180 MG tablet Take 1 tablet (180 mg total) by mouth daily. 30 tablet 2   fluticasone  (FLONASE ) 50 MCG/ACT nasal spray SPRAY 2 SPRAYS INTO  EACH NOSTRIL EVERY DAY 48 g 3   Magnesium 500 MG TABS Take 1 tablet by mouth daily.      Multiple Vitamins-Minerals (MULTIVITAMIN PO) Take 1 tablet by mouth daily.      OVER THE COUNTER MEDICATION Saw Palmetto 2 tabs daily     zinc gluconate 50 MG tablet Take 50 mg by mouth daily.     BIOTIN PO Take by mouth. (Patient not taking: Reported on 10/19/2024)     promethazine -dextromethorphan (PROMETHAZINE -DM) 6.25-15 MG/5ML syrup Take 5 mLs by mouth 4 (four) times daily as needed for cough. 240 mL 0   Current Facility-Administered Medications  Medication Dose Route Frequency Provider Last Rate Last Admin   0.9 %  sodium chloride  infusion  500 mL Intravenous Once Christopher Lupita BRAVO, MD        Allergies as of 11/02/2024 - Review Complete 11/02/2024  Allergen Reaction Noted   Wasp venom Anaphylaxis 06/30/2014   Androgel  [testosterone ] Other (See Comments) 07/03/2014    Family History  Problem Relation Age of Onset   Hypertension Mother    Colon polyps Mother    Cancer Mother        Uterine   Uterine cancer Mother    COPD Father    Arthritis Father    Cancer Father  Prostate   Prostate cancer Father    Colon cancer Neg Hx    Esophageal cancer Neg Hx    Rectal cancer Neg Hx    Stomach cancer Neg Hx     Social History   Socioeconomic History   Marital status: Married    Spouse name: Not on file   Number of children: Not on file   Years of education: Not on file   Highest education level: Not on file  Occupational History   Not on file  Tobacco Use   Smoking status: Former    Current packs/day: 0.00    Types: Cigarettes    Quit date: 09/28/1996    Years since quitting: 28.1   Smokeless tobacco: Never  Vaping Use   Vaping status: Never Used  Substance and Sexual Activity   Alcohol use: Yes    Alcohol/week: 14.0 standard drinks of alcohol    Types: 14 Glasses of wine per week    Comment: daily  wine   Drug use: No   Sexual activity: Not on file  Other Topics  Concern   Not on file  Social History Narrative   Not on file   Social Drivers of Health   Tobacco Use: Medium Risk (11/02/2024)   Patient History    Smoking Tobacco Use: Former    Smokeless Tobacco Use: Never    Passive Exposure: Not on file  Financial Resource Strain: Low Risk (05/10/2024)   Overall Financial Resource Strain (CARDIA)    Difficulty of Paying Living Expenses: Not hard at all  Food Insecurity: No Food Insecurity (05/10/2024)   Epic    Worried About Radiation Protection Practitioner of Food in the Last Year: Never true    Ran Out of Food in the Last Year: Never true  Transportation Needs: No Transportation Needs (05/10/2024)   Epic    Lack of Transportation (Medical): No    Lack of Transportation (Non-Medical): No  Lawrence Activity: Sufficiently Active (05/10/2024)   Exercise Vital Sign    Days of Exercise per Week: 6 days    Minutes of Exercise per Session: 90 min  Stress: No Stress Concern Present (05/10/2024)   [employer] of Occupational Health - Occupational Stress Questionnaire    Feeling of Stress: Not at all  Social Connections: Socially Integrated (05/10/2024)   Social Connection and Isolation Panel    Frequency of Communication with Friends and Family: Three times a week    Frequency of Social Gatherings with Friends and Family: Once a week    Attends Religious Services: More than 4 times per year    Active Member of Clubs or Organizations: Yes    Attends Banker Meetings: More than 4 times per year    Marital Status: Married  Catering Manager Violence: Not At Risk (05/10/2024)   Epic    Fear of Current or Ex-Partner: No    Emotionally Abused: No    Physically Abused: No    Sexually Abused: No  Depression (PHQ2-9): Low Risk (05/10/2024)   Depression (PHQ2-9)    PHQ-2 Score: 1  Alcohol Screen: Low Risk (05/10/2024)   Alcohol Screen    Last Alcohol Screening Score (AUDIT): 0  Housing: Unknown (05/10/2024)   Epic    Unable to Pay for Housing in the Last  Year: No    Number of Times Moved in the Last Year: Not on file    Homeless in the Last Year: No  Utilities: Not At Risk (05/10/2024)   Epic  Threatened with loss of utilities: No  Health Literacy: Adequate Health Literacy (05/10/2024)   B1300 Health Literacy    Frequency of need for help with medical instructions: Never    Review of Systems:  All other review of systems negative except as mentioned in the HPI.  Lawrence Exam: Vital signs BP 136/73   Pulse 65   Temp 97.7 F (36.5 C) (Temporal)   Ht 6' 1 (1.854 m)   Wt 211 lb (95.7 kg)   SpO2 95%   BMI 27.84 kg/m   General:   Alert,  Well-developed, well-nourished, pleasant and cooperative in NAD Lungs:  Clear throughout to auscultation.   Heart:  Regular rate and rhythm; no murmurs, clicks, rubs,  or gallops. Abdomen:  Soft, nontender and nondistended. Normal bowel sounds.   Neuro/Psych:  Alert and cooperative. Normal mood and affect. A and O x 3   @Ethelle Ola  CHARLENA Commander, MD, [facility] [phone] (pager) 11/02/2024 10:55 AM@

## 2024-11-02 ENCOUNTER — Ambulatory Visit: Admitting: Internal Medicine

## 2024-11-02 ENCOUNTER — Encounter: Payer: Self-pay | Admitting: Internal Medicine

## 2024-11-02 VITALS — BP 126/76 | HR 67 | Temp 97.7°F | Resp 14 | Ht 73.0 in | Wt 211.0 lb

## 2024-11-02 DIAGNOSIS — K648 Other hemorrhoids: Secondary | ICD-10-CM | POA: Diagnosis not present

## 2024-11-02 DIAGNOSIS — Z860101 Personal history of adenomatous and serrated colon polyps: Secondary | ICD-10-CM | POA: Diagnosis not present

## 2024-11-02 DIAGNOSIS — D123 Benign neoplasm of transverse colon: Secondary | ICD-10-CM

## 2024-11-02 DIAGNOSIS — K635 Polyp of colon: Secondary | ICD-10-CM | POA: Diagnosis not present

## 2024-11-02 DIAGNOSIS — Z860100 Personal history of colon polyps, unspecified: Secondary | ICD-10-CM

## 2024-11-02 DIAGNOSIS — Z860102 Personal history of hyperplastic colon polyps: Secondary | ICD-10-CM | POA: Diagnosis not present

## 2024-11-02 DIAGNOSIS — K620 Anal polyp: Secondary | ICD-10-CM

## 2024-11-02 DIAGNOSIS — K644 Residual hemorrhoidal skin tags: Secondary | ICD-10-CM

## 2024-11-02 DIAGNOSIS — K5730 Diverticulosis of large intestine without perforation or abscess without bleeding: Secondary | ICD-10-CM | POA: Diagnosis not present

## 2024-11-02 DIAGNOSIS — D124 Benign neoplasm of descending colon: Secondary | ICD-10-CM

## 2024-11-02 DIAGNOSIS — Z1211 Encounter for screening for malignant neoplasm of colon: Secondary | ICD-10-CM | POA: Diagnosis not present

## 2024-11-02 MED ORDER — SODIUM CHLORIDE 0.9 % IV SOLN: 500.0000 mL | Freq: Once | INTRAVENOUS | Status: DC

## 2024-11-02 NOTE — Progress Notes (Signed)
 Vss nad trans to pacu

## 2024-11-02 NOTE — Therapy (Signed)
 " OUTPATIENT PHYSICAL THERAPY LOWER EXTREMITY TREATMENT  Patient Name: Christopher Lawrence MRN: [id number] DOB:07-18-1956, 69 y.o., male Today's Date: 11/05/2024  END OF SESSION:  PT End of Session - 11/05/24 1343     Visit Number 4    Number of Visits 13    Date for Recertification  11/27/24    PT Start Time 1345    PT Stop Time 1423    PT Time Calculation (min) 38 min    Activity Tolerance Patient tolerated treatment well    Behavior During Therapy [facility] for tasks assessed/performed          Past Medical History:  Diagnosis Date   Allergy    Arthritis    Located in hips   GERD (gastroesophageal reflux disease)    Hx of adenomatous polyp of colon 10/27/2016   Labile hypertension    Other testicular hypofunction    Psoriasis    Past Surgical History:  Procedure Laterality Date   COLONOSCOPY  10/2016   COLONOSCOPY N/A 10/2016   HEMORRHOID BANDING  04/2011   HERNIA REPAIR Right 1958   INSERTION OF MESH N/A 08/30/2014   Procedure: INSERTION OF MESH;  Surgeon: Elspeth Schultze, MD;  Location: [facility] OR;  Service: General;  Laterality: N/A;   LAPAROSCOPIC INGUINAL HERNIA WITH UMBILICAL HERNIA N/A 08/30/2014   Procedure: LAPAROSCOPIC EXPLORATION AND REPAIR OF BILATERAL HERNIAS IN  GROINS AND UMBILICAL HERNIA REPAIR.;  Surgeon: Elspeth Schultze, MD;  Location: [facility] OR;  Service: General;  Laterality: N/A;   POLYPECTOMY     Patient Active Problem List   Diagnosis Date Noted   Hamstring tendonitis of left thigh 10/08/2024   Healthcare maintenance 09/10/2024   Right hip pain 03/10/2024   Hx of adenomatous polyp of colon 10/27/2016   Vitamin D  deficiency 07/26/2014   Gastroesophageal reflux disease    Labile hypertension    Testosterone  deficiency    Psoriasis     PCP: Chandra Toribio POUR, MD   REFERRING PROVIDER: Chandra Toribio POUR, MD   REFERRING DIAG:  Diagnosis  [phone] (ICD-10-CM) - Hamstring tendonitis of left thigh    THERAPY DIAG:  Strain of left hamstring muscle, subsequent  encounter  Muscle weakness (generalized)  Acute pain of left knee  Rationale for Evaluation and Treatment: Rehabilitation  ONSET DATE: couple weeks  SUBJECTIVE:   SUBJECTIVE STATEMENT: Pt states no knee pain. PERTINENT HISTORY: + OA hips  PAIN:  Are you having pain? Yes: NPRS scale: 0/10  Pain location: lateral knee /hamstring Pain description: dull, sharp Aggravating factors: ambulation, stairs Relieving factors: rest  PRECAUTIONS: None  RED FLAGS: None   WEIGHT BEARING RESTRICTIONS: No  FALLS:  Has patient fallen in last 6 months? No  LIVING ENVIRONMENT: Lives with: lives with their family Lives in: House/apartment Stairs: Yes: Internal: 6 steps; on right going up Has following equipment at home: None  OCCUPATION: retired  PLOF: Independent  PATIENT GOALS: decrease pain  NEXT MD VISIT: unknown2.5  OBJECTIVE:  Note: Objective measures were completed at Evaluation unless otherwise noted.  DIAGNOSTIC FINDINGS: no xrays  PATIENT SURVEYS:  PSFS: THE PATIENT SPECIFIC FUNCTIONAL SCALE  Place score of 0-10 (0 = unable to perform activity and 10 = able to perform activity at the same level as before injury or problem)  Activity Date: 12.16.25    Walking 2.5 miles  5    2.alternating up stairs 0    3.     4.      Total Score 2.5  Total Score = Sum of activity scores/number of activities  Minimally Detectable Change: 3 points (for single activity); 2 points (for average score)  [facility] (nd). The Patient Specific Functional Scale . Retrieved from [email or web address]   COGNITION: Overall cognitive status: Within functional limits for tasks assessed     SENSATION: WFL  EDEMA:  NT  MUSCLE LENGTH: Hamstrings:  Left moderate restriction deg Gastrocsoleus: Left moderate rest  POSTURE: No Significant postural limitations  PALPATION: No palpable tenderness   LOWER EXTREMITY  ROM:  Active ROM Right eval Left eval  Hip flexion  75%  Hip extension    Hip abduction    Hip adduction    Hip internal rotation    Hip external rotation    Knee flexion    Knee extension  +4 then pain to 0  Ankle dorsiflexion    Ankle plantarflexion    Ankle inversion    Ankle eversion     (Blank rows = not tested)  LOWER EXTREMITY MMT:  MMT Right eval Left eval  Hip flexion  4+  Hip extension    Hip abduction    Hip adduction    Hip internal rotation    Hip external rotation    Knee flexion  4+  Knee extension  4+  Ankle dorsiflexion    Ankle plantarflexion    Ankle inversion    Ankle eversion     (Blank rows = not tested)  LOWER EXTREMITY SPECIAL TESTS:  Hip special tests: Belvie (FABER) test: negative + Pain with resisted knee flexion  FUNCTIONAL TESTS:  Step ups 8  step + pain   GAIT: Distance walked: Financial Trader device utilized: None Level of assistance: Complete Independence Comments: --  =================================================================                                                                                                                            TREATMENT DATE:  11/05/24 TherEx Recumbent bike x 7 minutes, level 4  Standing Hip flexor stretch in lunge position x 3 holding 30 sec Gastroc and soleus stretch on slant board x 2 each 30 sec Seated hamstring stretch: x 3 on left LE Calf raise: double leg up, single leg down x 10 bil c UE support TherAct Double leg press: 112# 3 x 10  Single leg press: 62# 2 x 10 bil LE Manual:  Percussion  device to bil calves and left hamstring   10/30/24:  TherEx Recumbent bike x 5 minutes, level 4  Standing Hip flexor stretch in lunge position x 3 holding 30 sec Gastroc and soleus stretch on slant board x 2 each 30 sec Seated hamstring stretch: x 3 on left LE Calf raise: double leg up, single leg down x 10 bil c UE support TherAct Double leg press: 112# 3 x 10   Single leg press: 62# 2 x 10 bil LE Manual:  Percussion  device to bil calves and left hamstring Self  Care Using tennis ball or massager at home for bil calfs and hamstring as needed    10/22/24 Rec bike 7 min level 1 Leg Press #112 3x10 Figure four stretch 3x25 sec Incline board stretch for gastrocs 3x25 sec TB supine hip abduction 3x10 blue Hamstring strap stretch 3x30 sec ITB and Inner thigh strap stretch 1x each   10/16/24 Initial evaluation of L hamstring completed followed by instruction and trial set of HEP for stretching of L hamstring.   PATIENT EDUCATION:  Education details: HEP Person educated: Patient Education method: Programmer, Multimedia, Facilities Manager, Actor cues, Verbal cues, and Handouts Education comprehension: verbalized understanding, returned demonstration, and verbal cues required  HOME EXERCISE PROGRAM: Access Code: [email or web address] URL: [email or web address] Date: 10/16/2024 Prepared by: Burnard Meth  Exercises - Long Sitting Calf Stretch with Strap  - 2 x daily - 7 x weekly - 1 sets - 3 reps - 25 hold - Gastroc Stretch on Wall  - 2 x daily - 7 x weekly - 1 sets - 3 reps - 25 hold - Hooklying Hamstring Stretch with Strap  - 2 x daily - 7 x weekly - 1 sets - 3 reps - 25 hold - Standing Hamstring Stretch on Chair  - 2 x daily - 7 x weekly - 1 sets - 3 reps - 25 hold  ASSESSMENT:  CLINICAL IMPRESSION Pt doing well with home stretching, HEP and walking program.  Felt some increased mobility in LE with ambulation after percussive device.   STG completed. LTG #1,4 completed.   OBJECTIVE IMPAIRMENTS: difficulty walking, decreased strength, impaired flexibility, and pain.   ACTIVITY LIMITATIONS: stairs  PARTICIPATION LIMITATIONS: community activity  PERSONAL FACTORS: 1-2 comorbidities: OA hips are also affecting patient's functional outcome.   REHAB POTENTIAL: Good  CLINICAL DECISION MAKING: Stable/uncomplicated  EVALUATION COMPLEXITY:  Low   GOALS: Goals reviewed with patient? Yes  SHORT TERM GOALS: Target date: 11/06/2024  MET    Pt to be independent with HEP. Baseline: Goal status: MET 11/05/24  2.  Decrease pain by 1 level. Baseline:  Goal status: MET 11/05/24  LONG TERM GOALS: Target date: 11/27/2024  6 weeks    Pt to be independent with self progressive HEP at discharge.   Baseline:  Goal status: MET 11/05/24 2.  Increase flexibility of L hamstrings and gastroc to mild restriction. Baseline:  Goal status: Progressing 11/05/24 3.  Increase hip/LE strength by 1/2 grade to 1 full grade. Baseline:  Goal status: Progressing 11/05/24  4.  Decrease max pain to 2/10 with normal morning walk.  Baseline:  Goal status: MET 11/05/24   PLAN:  PT FREQUENCY: 1-2x/week  PT DURATION: 6 weeks  PLANNED INTERVENTIONS: 97164- PT Re-evaluation, 97110-Therapeutic exercises, 97530- Therapeutic activity, 97112- Neuromuscular re-education, 97535- Self Care, 02859- Manual therapy, [phone]- Gait training, [phone]- Aquatic Therapy, [phone]- Electrical stimulation (unattended), [phone] (1-2 muscles), [phone] (3+ muscles)- Dry Needling, Patient/Family education, Balance training, Joint mobilization, Spinal mobilization, Cryotherapy, and Moist heat  PLAN FOR NEXT SESSION:  Pt to sign up at local gym and start with a trainer.  He will report back to PT services if he is experiencing any problems at the gym - as it will be new for him. Possible discharge.  Burnard CHRISTELLA Meth, PT, MPT 11/05/2024, 3:52 PM  "

## 2024-11-02 NOTE — Op Note (Signed)
 [facility] Patient Name: Christopher Lawrence Procedure Date: 11/02/2024 10:46 AM MRN: [id number] Endoscopist: Lupita FORBES Commander , MD, [id number] Age: 69 Referring MD:  Date of Birth: 07/20/1956 Gender: Male Account #: [id number] Procedure:                Colonoscopy Indications:              High risk colon cancer surveillance: Personal                            history of colonic polyps, Last colonoscopy: 2020 Medicines:                Monitored Anesthesia Care Procedure:                Pre-Anesthesia Assessment:                           - Prior to the procedure, a History and Physical                            was performed, and patient medications and                            allergies were reviewed. The patient's tolerance of                            previous anesthesia was also reviewed. The risks                            and benefits of the procedure and the sedation                            options and risks were discussed with the patient.                            All questions were answered, and informed consent                            was obtained. Prior Anticoagulants: The patient has                            taken no anticoagulant or antiplatelet agents. ASA                            Grade Assessment: II - A patient with mild systemic                            disease. After reviewing the risks and benefits,                            the patient was deemed in satisfactory condition to                            undergo the procedure.  After obtaining informed consent, the colonoscope                            was passed under direct vision. Throughout the                            procedure, the patient's blood pressure, pulse, and                            oxygen saturations were monitored continuously. The                            CF [id number] #[id number] was introduced through the anus                            and advanced to  the the cecum, identified by                            appendiceal orifice and ileocecal valve. The                            colonoscopy was performed without difficulty. The                            patient tolerated the procedure well. The quality                            of the bowel preparation was good. The ileocecal                            valve, appendiceal orifice, and rectum were                            photographed. The bowel preparation used was                            Miralax via split dose instruction. Scope In: 11:04:10 AM Scope Out: 11:21:10 AM Scope Withdrawal Time: 0 hours 12 minutes 9 seconds  Total Procedure Duration: 0 hours 17 minutes 0 seconds  Findings:                 The perianal exam findings include anal tag.                           Two sessile polyps were found in the proximal                            descending colon and distal transverse colon. The                            polyps were diminutive in size. These polyps were                            removed with a cold snare. Resection and  retrieval                            were complete. Verification of patient                            identification for the specimen was done. Estimated                            blood loss was minimal.                           Multiple diverticula were found in the sigmoid                            colon.                           A small polyp was found in the anus. Biopsies were                            taken with a cold forceps for histology.                            Verification of patient identification for the                            specimen was done. Estimated blood loss was minimal.                           Internal hemorrhoids were found.                           The exam was otherwise without abnormality on                            direct and retroflexion views. Complications:            No immediate  complications. Estimated Blood Loss:     Estimated blood loss was minimal. Impression:               - Anal tag. found on perianal exam.                           - Two diminutive polyps in the proximal descending                            colon and in the distal transverse colon, removed                            with a cold snare. Resected and retrieved.                           - Diverticulosis in the sigmoid colon.                           -  One small polyp at the anus. Biopsied. R/O AIN                           - Internal hemorrhoids.                           - The examination was otherwise normal on direct                            and retroflexion views.                           - Personal history of colonic polyps. 10/2016 - 15                            mm TV adenoma                           10/2019 3 distal hyperplastics Recommendation:           - Patient has a contact number available for                            emergencies. The signs and symptoms of potential                            delayed complications were discussed with the                            patient. Return to normal activities tomorrow.                            Written discharge instructions were provided to the                            patient.                           - Resume previous diet.                           - Continue present medications.                           - Repeat colonoscopy is recommended for                            surveillance. The colonoscopy date will be                            determined after pathology results from today's                            exam become available for review. Lupita FORBES Commander, MD 11/02/2024 11:33:19 AM This report has been signed electronically.

## 2024-11-02 NOTE — Patient Instructions (Addendum)
 I found and removed 2 tiny polyps and I biopsied an anal polyp.  It is probably just thickening at the anus but I wanted to make sure it was not a precancerous lesion.  I will let you know results and recommendations.  You also have diverticulosis and hemorrhoids.  I appreciate the opportunity to care for you. Lupita CHARLENA Commander, MD, FACG  YOU HAD AN ENDOSCOPIC PROCEDURE TODAY AT THE [facility]:   Refer to the procedure report that was given to you for any specific questions about what was found during the examination.  If the procedure report does not answer your questions, please call your gastroenterologist to clarify.  If you requested that your care partner not be given the details of your procedure findings, then the procedure report has been included in a sealed envelope for you to review at your convenience later.  YOU SHOULD EXPECT: Some feelings of bloating in the abdomen. Passage of more gas than usual.  Walking can help get rid of the air that was put into your GI tract during the procedure and reduce the bloating. If you had a lower endoscopy (such as a colonoscopy or flexible sigmoidoscopy) you may notice spotting of blood in your stool or on the toilet paper. If you underwent a bowel prep for your procedure, you may not have a normal bowel movement for a few days.  Please Note:  You might notice some irritation and congestion in your nose or some drainage.  This is from the oxygen used during your procedure.  There is no need for concern and it should clear up in a day or so.  SYMPTOMS TO REPORT IMMEDIATELY:  Following lower endoscopy (colonoscopy or flexible sigmoidoscopy):  Excessive amounts of blood in the stool  Significant tenderness or worsening of abdominal pains  Swelling of the abdomen that is new, acute  Fever of 100F or higher  For urgent or emergent issues, a gastroenterologist can be reached at any hour by calling [phone]. Do not use MyChart  messaging for urgent concerns.    DIET:  We do recommend a small meal at first, but then you may proceed to your regular diet.  Drink plenty of fluids but you should avoid alcoholic beverages for 24 hours.  ACTIVITY:  You should plan to take it easy for the rest of today and you should NOT DRIVE or use heavy machinery until tomorrow (because of the sedation medicines used during the test).    FOLLOW UP: Our staff will call the number listed on your records the next business day following your procedure.  We will call around 7:15- 8:00 am to check on you and address any questions or concerns that you may have regarding the information given to you following your procedure. If we do not reach you, we will leave a message.     If any biopsies were taken you will be contacted by phone or by letter within the next 1-3 weeks.  Please call us  at [phone] if you have not heard about the biopsies in 3 weeks.    SIGNATURES/CONFIDENTIALITY: You and/or your care partner have signed paperwork which will be entered into your electronic medical record.  These signatures attest to the fact that that the information above on your After Visit Summary has been reviewed and is understood.  Full responsibility of the confidentiality of this discharge information lies with you and/or your care-partner.

## 2024-11-05 ENCOUNTER — Telehealth: Payer: Self-pay | Admitting: *Deleted

## 2024-11-05 ENCOUNTER — Ambulatory Visit

## 2024-11-05 DIAGNOSIS — M25562 Pain in left knee: Secondary | ICD-10-CM | POA: Diagnosis not present

## 2024-11-05 DIAGNOSIS — M6281 Muscle weakness (generalized): Secondary | ICD-10-CM

## 2024-11-05 DIAGNOSIS — S76312D Strain of muscle, fascia and tendon of the posterior muscle group at thigh level, left thigh, subsequent encounter: Secondary | ICD-10-CM

## 2024-11-05 NOTE — Telephone Encounter (Signed)
 No answer for follow up call. Left a message.

## 2024-11-06 LAB — SURGICAL PATHOLOGY

## 2024-11-11 ENCOUNTER — Ambulatory Visit: Payer: Self-pay | Admitting: Internal Medicine

## 2024-11-11 DIAGNOSIS — Z860101 Personal history of adenomatous and serrated colon polyps: Secondary | ICD-10-CM

## 2025-07-04 ENCOUNTER — Ambulatory Visit

## 2025-09-04 ENCOUNTER — Other Ambulatory Visit

## 2025-09-11 ENCOUNTER — Encounter: Admitting: Family Medicine
